# Patient Record
Sex: Female | Born: 1935 | Race: White | Hispanic: No | Smoking: Never smoker
Health system: Southern US, Community
[De-identification: ages and names within clinical notes are randomized; demographics above are authoritative.]

## PROBLEM LIST (undated history)

## (undated) DIAGNOSIS — W19XXXA Unspecified fall, initial encounter: Secondary | ICD-10-CM

## (undated) DIAGNOSIS — I10 Essential (primary) hypertension: Secondary | ICD-10-CM

## (undated) DIAGNOSIS — N189 Chronic kidney disease, unspecified: Secondary | ICD-10-CM

## (undated) DIAGNOSIS — E7800 Pure hypercholesterolemia, unspecified: Secondary | ICD-10-CM

## (undated) DIAGNOSIS — K219 Gastro-esophageal reflux disease without esophagitis: Secondary | ICD-10-CM

## (undated) DIAGNOSIS — C801 Malignant (primary) neoplasm, unspecified: Secondary | ICD-10-CM

## (undated) DIAGNOSIS — K253 Acute gastric ulcer without hemorrhage or perforation: Secondary | ICD-10-CM

## (undated) DIAGNOSIS — M1990 Unspecified osteoarthritis, unspecified site: Secondary | ICD-10-CM

## (undated) DIAGNOSIS — K56699 Other intestinal obstruction unspecified as to partial versus complete obstruction: Secondary | ICD-10-CM

## (undated) DIAGNOSIS — D500 Iron deficiency anemia secondary to blood loss (chronic): Secondary | ICD-10-CM

## (undated) DIAGNOSIS — I2510 Atherosclerotic heart disease of native coronary artery without angina pectoris: Secondary | ICD-10-CM

## (undated) DIAGNOSIS — D649 Anemia, unspecified: Secondary | ICD-10-CM

## (undated) DIAGNOSIS — G4700 Insomnia, unspecified: Secondary | ICD-10-CM

## (undated) DIAGNOSIS — I639 Cerebral infarction, unspecified: Secondary | ICD-10-CM

## (undated) DIAGNOSIS — I519 Heart disease, unspecified: Secondary | ICD-10-CM

## (undated) HISTORY — DX: Insomnia, unspecified: G47.00

## (undated) HISTORY — DX: Unspecified osteoarthritis, unspecified site: M19.90

## (undated) HISTORY — DX: Acute gastric ulcer without hemorrhage or perforation: K25.3

## (undated) HISTORY — DX: Essential (primary) hypertension: I10

## (undated) HISTORY — DX: Anemia, unspecified: D64.9

## (undated) HISTORY — DX: Iron deficiency anemia secondary to blood loss (chronic): D50.0

## (undated) HISTORY — DX: Other intestinal obstruction unspecified as to partial versus complete obstruction: K56.699

## (undated) HISTORY — DX: Unspecified fall, initial encounter: W19.XXXA

## (undated) HISTORY — DX: Heart disease, unspecified: I51.9

## (undated) HISTORY — DX: Malignant (primary) neoplasm, unspecified: C80.1

## (undated) HISTORY — DX: Pure hypercholesterolemia, unspecified: E78.00

## (undated) HISTORY — PX: OTHER SURGICAL HISTORY: SHX169

## (undated) HISTORY — DX: Gastro-esophageal reflux disease without esophagitis: K21.9

## (undated) HISTORY — PX: TONSILLECTOMY: SUR1361

---

## 2003-02-11 HISTORY — PX: CARDIAC SURGERY: SHX584

## 2003-02-11 HISTORY — PX: CORONARY ARTERY BYPASS GRAFT: SHX141

## 2003-05-01 ENCOUNTER — Other Ambulatory Visit: Payer: Self-pay

## 2004-06-10 ENCOUNTER — Ambulatory Visit: Payer: Self-pay | Admitting: Internal Medicine

## 2005-08-18 ENCOUNTER — Ambulatory Visit: Payer: Self-pay | Admitting: Internal Medicine

## 2006-03-12 ENCOUNTER — Ambulatory Visit: Payer: Self-pay | Admitting: Internal Medicine

## 2006-10-06 ENCOUNTER — Ambulatory Visit: Payer: Self-pay | Admitting: Internal Medicine

## 2007-10-25 ENCOUNTER — Ambulatory Visit: Payer: Self-pay | Admitting: Internal Medicine

## 2009-01-29 ENCOUNTER — Ambulatory Visit: Payer: Self-pay | Admitting: Internal Medicine

## 2010-04-03 ENCOUNTER — Ambulatory Visit: Payer: Self-pay | Admitting: Ophthalmology

## 2010-05-06 ENCOUNTER — Ambulatory Visit: Payer: Self-pay | Admitting: Ophthalmology

## 2010-06-10 ENCOUNTER — Ambulatory Visit: Payer: Self-pay | Admitting: Internal Medicine

## 2011-07-15 ENCOUNTER — Ambulatory Visit: Payer: Self-pay | Admitting: Internal Medicine

## 2015-01-02 ENCOUNTER — Inpatient Hospital Stay: Payer: PPO | Attending: Internal Medicine | Admitting: Internal Medicine

## 2015-01-02 ENCOUNTER — Inpatient Hospital Stay: Payer: PPO

## 2015-01-02 ENCOUNTER — Encounter: Payer: Self-pay | Admitting: Internal Medicine

## 2015-01-02 VITALS — BP 159/73 | HR 59 | Temp 96.7°F | Resp 18 | Wt 164.7 lb

## 2015-01-02 DIAGNOSIS — K219 Gastro-esophageal reflux disease without esophagitis: Secondary | ICD-10-CM | POA: Diagnosis not present

## 2015-01-02 DIAGNOSIS — D500 Iron deficiency anemia secondary to blood loss (chronic): Secondary | ICD-10-CM

## 2015-01-02 DIAGNOSIS — E7800 Pure hypercholesterolemia, unspecified: Secondary | ICD-10-CM | POA: Diagnosis not present

## 2015-01-02 DIAGNOSIS — I10 Essential (primary) hypertension: Secondary | ICD-10-CM | POA: Insufficient documentation

## 2015-01-02 DIAGNOSIS — Z7982 Long term (current) use of aspirin: Secondary | ICD-10-CM | POA: Diagnosis not present

## 2015-01-02 DIAGNOSIS — D509 Iron deficiency anemia, unspecified: Secondary | ICD-10-CM | POA: Diagnosis not present

## 2015-01-02 DIAGNOSIS — Z79899 Other long term (current) drug therapy: Secondary | ICD-10-CM | POA: Diagnosis not present

## 2015-01-02 LAB — COMPREHENSIVE METABOLIC PANEL
ALBUMIN: 3.7 g/dL (ref 3.5–5.0)
ALT: 12 U/L — ABNORMAL LOW (ref 14–54)
ANION GAP: 6 (ref 5–15)
AST: 17 U/L (ref 15–41)
Alkaline Phosphatase: 43 U/L (ref 38–126)
BUN: 27 mg/dL — ABNORMAL HIGH (ref 6–20)
CO2: 24 mmol/L (ref 22–32)
Calcium: 8.8 mg/dL — ABNORMAL LOW (ref 8.9–10.3)
Chloride: 106 mmol/L (ref 101–111)
Creatinine, Ser: 1.36 mg/dL — ABNORMAL HIGH (ref 0.44–1.00)
GFR calc Af Amer: 37 mL/min — ABNORMAL LOW (ref >60)
GFR calc non Af Amer: 32 mL/min — ABNORMAL LOW (ref >60)
GLUCOSE: 105 mg/dL — AB (ref 65–99)
POTASSIUM: 4.1 mmol/L (ref 3.5–5.1)
SODIUM: 136 mmol/L (ref 135–145)
Total Bilirubin: 0.3 mg/dL (ref 0.3–1.2)
Total Protein: 7.3 g/dL (ref 6.5–8.1)

## 2015-01-02 LAB — CBC WITH DIFFERENTIAL/PLATELET
BASOS ABS: 0 10*3/uL (ref 0–0.1)
Basophils Relative: 1 %
Eosinophils Absolute: 0.3 10*3/uL (ref 0–0.7)
Eosinophils Relative: 4 %
HEMATOCRIT: 24.9 % — AB (ref 35.0–47.0)
Hemoglobin: 7.7 g/dL — ABNORMAL LOW (ref 12.0–16.0)
LYMPHS PCT: 17 %
Lymphs Abs: 1.2 10*3/uL (ref 1.0–3.6)
MCH: 20.2 pg — ABNORMAL LOW (ref 26.0–34.0)
MCHC: 30.8 g/dL — ABNORMAL LOW (ref 32.0–36.0)
MCV: 65.6 fL — AB (ref 80.0–100.0)
MONO ABS: 0.8 10*3/uL (ref 0.2–0.9)
Monocytes Relative: 11 %
NEUTROS ABS: 5 10*3/uL (ref 1.4–6.5)
Neutrophils Relative %: 67 %
Platelets: 310 10*3/uL (ref 150–440)
RBC: 3.8 MIL/uL (ref 3.80–5.20)
RDW: 17.2 % — ABNORMAL HIGH (ref 11.5–14.5)
WBC: 7.3 10*3/uL (ref 3.6–11.0)

## 2015-01-02 LAB — FERRITIN: FERRITIN: 5 ng/mL — AB (ref 11–307)

## 2015-01-02 LAB — IRON AND TIBC
Iron: 10 ug/dL — ABNORMAL LOW (ref 28–170)
SATURATION RATIOS: 2 % — AB (ref 10.4–31.8)
TIBC: 464 ug/dL — ABNORMAL HIGH (ref 250–450)
UIBC: 454 ug/dL

## 2015-01-02 NOTE — Progress Notes (Signed)
[place] @ [facility] Telephone:(336) [phone]  Fax:(336) [place]: [birth year]  MR#: [id number]  [id number]  Patient Care Team: Lynnell Jude, MD as PCP - General (Family Medicine)  CHIEF COMPLAINT: No chief complaint on file.  iron deficiency anemia  HISTORY OF PRESENT ILLNESS:   Patient is referred for evaluation of persistent microcytic anemia. Reportedly, the patient had iron deficiency anemia since 2014, with hemoglobin ranging between 6.5 and 8.2 g/dL. Review of previous medical records indicate that as far back as 2003 hemoglobin already was low at 10 g/dL Patient does not complain of any obvious bleeding from any source, she also denies change in the color of stool. She is able to perform all activities of daily living with minimal difficulties, mostly secondary to left knee pain. She claims that she has lost approximately 10 pounds of weight over the past 6 months, but her appetite is reasonable, and her diet is regular. She claims that occasionally she has constipation, but it does not last for more than a couple of days. She has been taking iron supplementation for the past 7 months. She claims that she has been taking iron twice a day rather than 3 times a day, but she has done this consistently without breaks.  REVIEW OF SYSTEMS:   Review of Systems  All other systems reviewed and are negative.    PAST MEDICAL HISTORY: Past Medical History  Diagnosis Date  . GERD (gastroesophageal reflux disease)   . Hypertension   . Hypercholesteremia   . Insomnia   . Arthritis   . Anemia   . Heart disease     PAST SURGICAL HISTORY: Past Surgical History  Procedure Laterality Date  . Cardiac surgery  2005  . Tonsillectomy    . Toe removal      FAMILY HISTORY No family history on file.  ADVANCED DIRECTIVES:  No flowsheet data found.  HEALTH MAINTENANCE: Social History  Substance Use Topics  . Smoking status: Not on file  . Smokeless tobacco:  Not on file  . Alcohol Use: Not on file     Allergies  Allergen Reactions  . Amoxicillin Rash    Current Outpatient Prescriptions  Medication Sig Dispense Refill  . aspirin 81 MG tablet Take 81 mg by mouth daily.    . hydrochlorothiazide (HYDRODIURIL) 25 MG tablet Take 25 mg by mouth daily.    Marland Kitchen lovastatin (MEVACOR) 40 MG tablet Take 40 mg by mouth at bedtime.    . metoprolol tartrate (LOPRESSOR) 25 MG tablet Take 25 mg by mouth 2 (two) times daily.    . naproxen sodium (ANAPROX) 220 MG tablet Take 220 mg by mouth daily.    Marland Kitchen omeprazole (PRILOSEC) 20 MG capsule Take 20 mg by mouth daily.     No current facility-administered medications for this visit.    OBJECTIVE:  Filed Vitals:   01/02/15 1401  BP: 159/73  Pulse: 59  Temp: 96.7 F (35.9 C)  Resp: 18     There is no height on file to calculate BMI.    ECOG FS:1 - Symptomatic but completely ambulatory  Physical Exam  Constitutional: She is oriented to person, place, and time and well-developed, well-nourished, and in no distress. No distress.  HENT:  Head: Normocephalic and atraumatic.  Right Ear: External ear normal.  Left Ear: External ear normal.  Mouth/Throat: Oropharynx is clear and moist.  Eyes: Conjunctivae are normal. Pupils are equal, round, and reactive to light. Right eye  exhibits no discharge. Left eye exhibits no discharge. No scleral icterus.  Neck: Normal range of motion. Neck supple. No JVD present. No tracheal deviation present. No thyromegaly present.  Cardiovascular: Normal rate, regular rhythm, normal heart sounds and intact distal pulses.  Exam reveals no gallop and no friction rub.   No murmur heard. Pulmonary/Chest: Effort normal and breath sounds normal. No stridor. No respiratory distress. She has no wheezes. She has no rales. She exhibits no tenderness.  Abdominal: Soft. Bowel sounds are normal. She exhibits no distension and no mass. There is no tenderness. There is no rebound and no guarding.    Genitourinary:  Postponed  Musculoskeletal: Normal range of motion. She exhibits no edema or tenderness.  Lymphadenopathy:    She has no cervical adenopathy.  Neurological: She is alert and oriented to person, place, and time. She has normal reflexes. No cranial nerve deficit. She exhibits normal muscle tone. Gait normal. Coordination normal. GCS score is 15.  Skin: Skin is warm. No rash noted. She is not diaphoretic. No erythema. No pallor.  Multiple skin tags There is a small skin break on the left shin with serous discharge  Psychiatric: Mood, memory, affect and judgment normal.     LAB RESULTS: Recent Results (from the past 2160 hour(s))  CBC with Differential/Platelet     Status: Abnormal   Collection Time: 01/02/15  3:05 PM  Result Value Ref Range   WBC 7.3 3.6 - 11.0 K/uL   RBC 3.80 3.80 - 5.20 MIL/uL   Hemoglobin 7.7 (L) 12.0 - 16.0 g/dL   HCT 24.9 (L) 35.0 - 47.0 %   MCV 65.6 (L) 80.0 - 100.0 fL   MCH 20.2 (L) 26.0 - 34.0 pg   MCHC 30.8 (L) 32.0 - 36.0 g/dL   RDW 17.2 (H) 11.5 - 14.5 %   Platelets 310 150 - 440 K/uL   Neutrophils Relative % 67 %   Neutro Abs 5.0 1.4 - 6.5 K/uL   Lymphocytes Relative 17 %   Lymphs Abs 1.2 1.0 - 3.6 K/uL   Monocytes Relative 11 %   Monocytes Absolute 0.8 0.2 - 0.9 K/uL   Eosinophils Relative 4 %   Eosinophils Absolute 0.3 0 - 0.7 K/uL   Basophils Relative 1 %   Basophils Absolute 0.0 0 - 0.1 K/uL     ASSESSMENT: Microcytic anemia- most likely iron deficiency anemia, since has history of iron deficiency anemia with low MCV and low ferritin level. Reportedly the patient has been on iron supplementation for at least 6 months without any improvement in hemoglobin concentration. We will take into account the fact that Ms. Cabazos is 81 years old, and would like to avoid invasive procedures, such as upper and lower endoscopies, so we will request her to perform FOBT 3 and send cartridges to our clinic. In the meantime, we will plan on  administering Feraheme 510 mg 1 during next week in our [place] clinic. Surprisingly Ms. Monsivais has no significant complaints, which would indicate that her energy level and ability to perform activities of daily living are severely compromised because of anemia, as such we will not request red blood cell transfusion. Once Shirlean Kelly is administered, patient should be seen in our clinic either in [place] or [place] in 1 month.   Patient expressed understanding and was in agreement with this plan. She also understands that She can call clinic at any time with any questions, concerns, or complaints.    No matching staging information was found for  the patient.  Roxana Hires, MD   01/02/2015 1:33 PM

## 2015-01-02 NOTE — Progress Notes (Signed)
Patient's hemoglobin was low with PCP labs and they have been following since 05/2014 with no improvement with the addition of iron tablets.  She denies any bleeding.

## 2015-01-08 ENCOUNTER — Inpatient Hospital Stay: Payer: PPO

## 2015-01-09 ENCOUNTER — Ambulatory Visit: Payer: PPO | Admitting: Internal Medicine

## 2015-01-10 ENCOUNTER — Telehealth: Payer: Self-pay | Admitting: *Deleted

## 2015-01-10 ENCOUNTER — Other Ambulatory Visit: Payer: Self-pay | Admitting: Internal Medicine

## 2015-01-10 DIAGNOSIS — D509 Iron deficiency anemia, unspecified: Secondary | ICD-10-CM | POA: Insufficient documentation

## 2015-01-10 NOTE — Telephone Encounter (Signed)
Pt had appt for Monday or tues in [place] to get iron and pt states she forgot, she had the appt for 12/22 but she did not see the other appt on card.  She would like to come on Thursday.  I told her I would check with [place] office and call her back.  I did check with [place] and pt can come over to [facility] Thursday 12/1 9:45.  Pt agreeable to the plan.

## 2015-01-11 ENCOUNTER — Inpatient Hospital Stay: Payer: PPO | Attending: Internal Medicine

## 2015-01-11 VITALS — BP 133/60 | HR 57 | Temp 96.9°F | Resp 18

## 2015-01-11 DIAGNOSIS — Z79899 Other long term (current) drug therapy: Secondary | ICD-10-CM | POA: Diagnosis not present

## 2015-01-11 DIAGNOSIS — I10 Essential (primary) hypertension: Secondary | ICD-10-CM | POA: Diagnosis not present

## 2015-01-11 DIAGNOSIS — E7800 Pure hypercholesterolemia, unspecified: Secondary | ICD-10-CM | POA: Insufficient documentation

## 2015-01-11 DIAGNOSIS — Z7982 Long term (current) use of aspirin: Secondary | ICD-10-CM | POA: Diagnosis not present

## 2015-01-11 DIAGNOSIS — D509 Iron deficiency anemia, unspecified: Secondary | ICD-10-CM | POA: Insufficient documentation

## 2015-01-11 DIAGNOSIS — D500 Iron deficiency anemia secondary to blood loss (chronic): Secondary | ICD-10-CM

## 2015-01-11 DIAGNOSIS — K219 Gastro-esophageal reflux disease without esophagitis: Secondary | ICD-10-CM | POA: Diagnosis not present

## 2015-01-11 LAB — OCCULT BLOOD X 1 CARD TO LAB, STOOL
Fecal Occult Bld: NEGATIVE
Fecal Occult Bld: NEGATIVE

## 2015-01-11 MED ORDER — SODIUM CHLORIDE 0.9 % IV SOLN
510.0000 mg | Freq: Once | INTRAVENOUS | Status: AC
  Administered 2015-01-11: 510 mg via INTRAVENOUS
  Filled 2015-01-11: qty 17

## 2015-01-11 MED ORDER — SODIUM CHLORIDE 0.9 % IV SOLN
Freq: Once | INTRAVENOUS | Status: AC
  Administered 2015-01-11: 10:00:00 via INTRAVENOUS
  Filled 2015-01-11: qty 1000

## 2015-01-11 NOTE — Progress Notes (Signed)
Pt received just about all of her feraheme infusion when she mentioned she was itchy~ 5 ml left in bag. Stopped infusion. Started NS back up. Will observe pt for 30 minutes. Pt is not scratching, does not feel warm or short of breath. Pt had no itching or any side effects at time of discharge. Pt was observed for 1 hour d/t her age (81 yo) and she drove herself to clinic today.

## 2015-01-11 NOTE — Patient Instructions (Signed)

## 2015-02-01 ENCOUNTER — Inpatient Hospital Stay (HOSPITAL_BASED_OUTPATIENT_CLINIC_OR_DEPARTMENT_OTHER): Payer: PPO | Admitting: Internal Medicine

## 2015-02-01 ENCOUNTER — Inpatient Hospital Stay: Payer: PPO

## 2015-02-01 VITALS — BP 149/67 | HR 53 | Temp 96.2°F | Resp 18

## 2015-02-01 VITALS — BP 149/64 | HR 54 | Temp 97.6°F | Ht 64.0 in | Wt 164.1 lb

## 2015-02-01 DIAGNOSIS — D500 Iron deficiency anemia secondary to blood loss (chronic): Secondary | ICD-10-CM

## 2015-02-01 DIAGNOSIS — Z79899 Other long term (current) drug therapy: Secondary | ICD-10-CM | POA: Diagnosis not present

## 2015-02-01 DIAGNOSIS — D509 Iron deficiency anemia, unspecified: Secondary | ICD-10-CM

## 2015-02-01 LAB — CBC WITH DIFFERENTIAL/PLATELET
BASOS ABS: 0 10*3/uL (ref 0–0.1)
Basophils Relative: 1 %
EOS ABS: 0.3 10*3/uL (ref 0–0.7)
EOS PCT: 5 %
HCT: 28.1 % — ABNORMAL LOW (ref 35.0–47.0)
Hemoglobin: 8.7 g/dL — ABNORMAL LOW (ref 12.0–16.0)
LYMPHS PCT: 16 %
Lymphs Abs: 0.9 10*3/uL — ABNORMAL LOW (ref 1.0–3.6)
MCH: 22.3 pg — ABNORMAL LOW (ref 26.0–34.0)
MCHC: 31 g/dL — ABNORMAL LOW (ref 32.0–36.0)
MCV: 72 fL — AB (ref 80.0–100.0)
Monocytes Absolute: 0.4 10*3/uL (ref 0.2–0.9)
Monocytes Relative: 8 %
Neutro Abs: 3.9 10*3/uL (ref 1.4–6.5)
Neutrophils Relative %: 70 %
PLATELETS: 291 10*3/uL (ref 150–440)
RBC: 3.91 MIL/uL (ref 3.80–5.20)
RDW: 26.2 % — ABNORMAL HIGH (ref 11.5–14.5)
WBC: 5.5 10*3/uL (ref 3.6–11.0)

## 2015-02-01 LAB — FERRITIN: FERRITIN: 36 ng/mL (ref 11–307)

## 2015-02-01 MED ORDER — SODIUM CHLORIDE 0.9 % IV SOLN
510.0000 mg | Freq: Once | INTRAVENOUS | Status: AC
  Administered 2015-02-01: 510 mg via INTRAVENOUS
  Filled 2015-02-01: qty 17

## 2015-02-01 NOTE — Progress Notes (Signed)
Patient here reports occasional orange colored spotting.

## 2015-02-01 NOTE — Progress Notes (Signed)
[place] @ [facility] Telephone:(336) [phone]  Fax:(336) [place]: 12-28-1917  MR#: [id number]  [id number]#:[id number]  Patient Care Team: Lynnell Jude, MD as PCP - General (Family Medicine)  CHIEF COMPLAINT: No chief complaint on file.  iron deficiency anemia  HISTORY OF PRESENT ILLNESS:   Patient is referred for evaluation of persistent microcytic anemia. Reportedly, the patient had iron deficiency anemia since 2014, with hemoglobin ranging between 6.5 and 8.2 g/dL. Review of previous medical records indicate that as far back as 2003 hemoglobin already was low at 10 g/dL. Our workup was consistent with severe iron deficiency anemia. Taking into account patient's very respectable age, we decided not to pursue any invasive diagnostic procedures, but we performed fecal occult blood tests which were negative.she received 1 dose of Feraheme, which was able to tolerate well.  Current status: The patient returns to our clinic for a follow-up visit. She has been feeling reasonably well. Patient does not complain of any obvious bleeding from any source, she also denies change in the color of stool. She is able to perform all activities of daily living with minimal difficulties, mostly secondary to left knee pain. She claims that she has lost approximately 10 pounds of weight over the past 6 months, but her appetite is reasonable, and her diet is regular. She claims that occasionally she has constipation, but it does not last for more than a couple of days. She has been taking iron supplementation for the past 7 months. She claims that she has been taking iron twice a day rather than 3 times a day, but she has done this consistently without breaks. Today she told us that for the past 38month she has had orange scant vaginal discharge, which has resolved by now.She denies any dysuria, burning with urination. She denies any bleeding.  REVIEW OF SYSTEMS:   Review of Systems  All other  systems reviewed and are negative.    PAST MEDICAL HISTORY: Past Medical History  Diagnosis Date  . GERD (gastroesophageal reflux disease)   . Hypertension   . Hypercholesteremia   . Insomnia   . Arthritis   . Anemia   . Heart disease     PAST SURGICAL HISTORY: Past Surgical History  Procedure Laterality Date  . Cardiac surgery  2005  . Tonsillectomy    . Toe removal      FAMILY HISTORY No family history on file.  ADVANCED DIRECTIVES:  No flowsheet data found.  HEALTH MAINTENANCE: Social History  Substance Use Topics  . Smoking status: Never Smoker   . Smokeless tobacco: Never Used  . Alcohol Use: No     Allergies  Allergen Reactions  . Amoxicillin Rash    Current Outpatient Prescriptions  Medication Sig Dispense Refill  . aspirin 81 MG tablet Take 81 mg by mouth daily.    . hydrochlorothiazide (HYDRODIURIL) 25 MG tablet Take 25 mg by mouth daily.    .Marland Kitchenlovastatin (MEVACOR) 40 MG tablet Take 40 mg by mouth at bedtime.    . metoprolol tartrate (LOPRESSOR) 25 MG tablet Take 25 mg by mouth 2 (two) times daily.    . naproxen sodium (ANAPROX) 220 MG tablet Take 220 mg by mouth daily.    .Marland Kitchenomeprazole (PRILOSEC) 20 MG capsule Take 20 mg by mouth daily.     No current facility-administered medications for this visit.    OBJECTIVE:  There were no vitals filed for this visit.   There is no height  or weight on file to calculate BMI.    ECOG FS:1 - Symptomatic but completely ambulatory  Physical Exam  Constitutional: She is oriented to person, place, and time and well-developed, well-nourished, and in no distress. No distress.  HENT:  Head: Normocephalic and atraumatic.  Right Ear: External ear normal.  Left Ear: External ear normal.  Mouth/Throat: Oropharynx is clear and moist.  Eyes: Conjunctivae are normal. Pupils are equal, round, and reactive to light. Right eye exhibits no discharge. Left eye exhibits no discharge. No scleral icterus.  Neck: Normal range of  motion. Neck supple. No JVD present. No tracheal deviation present. No thyromegaly present.  Cardiovascular: Normal rate, regular rhythm, normal heart sounds and intact distal pulses.  Exam reveals no gallop and no friction rub.   No murmur heard. Pulmonary/Chest: Effort normal and breath sounds normal. No stridor. No respiratory distress. She has no wheezes. She has no rales. She exhibits no tenderness.  Abdominal: Soft. Bowel sounds are normal. She exhibits no distension and no mass. There is no tenderness. There is no rebound and no guarding.  Genitourinary:  Postponed  Musculoskeletal: Normal range of motion. She exhibits no edema or tenderness.  Lymphadenopathy:    She has no cervical adenopathy.  Neurological: She is alert and oriented to person, place, and time. She has normal reflexes. No cranial nerve deficit. She exhibits normal muscle tone. Gait normal. Coordination normal. GCS score is 15.  Skin: Skin is warm. No rash noted. She is not diaphoretic. No erythema. No pallor.  Multiple skin tags There is a small skin break on the left shin with serous discharge  Psychiatric: Mood, memory, affect and judgment normal.     LAB RESULTS: Recent Results (from the past 2160 hour(s))  CBC with Differential/Platelet     Status: Abnormal   Collection Time: 01/02/15  3:05 PM  Result Value Ref Range   WBC 7.3 3.6 - 11.0 K/uL   RBC 3.80 3.80 - 5.20 MIL/uL   Hemoglobin 7.7 (L) 12.0 - 16.0 g/dL   HCT 24.9 (L) 35.0 - 47.0 %   MCV 65.6 (L) 80.0 - 100.0 fL   MCH 20.2 (L) 26.0 - 34.0 pg   MCHC 30.8 (L) 32.0 - 36.0 g/dL   RDW 17.2 (H) 11.5 - 14.5 %   Platelets 310 150 - 440 K/uL   Neutrophils Relative % 67 %   Neutro Abs 5.0 1.4 - 6.5 K/uL   Lymphocytes Relative 17 %   Lymphs Abs 1.2 1.0 - 3.6 K/uL   Monocytes Relative 11 %   Monocytes Absolute 0.8 0.2 - 0.9 K/uL   Eosinophils Relative 4 %   Eosinophils Absolute 0.3 0 - 0.7 K/uL   Basophils Relative 1 %   Basophils Absolute 0.0 0 - 0.1  K/uL  Iron and TIBC     Status: Abnormal   Collection Time: 01/02/15  3:05 PM  Result Value Ref Range   Iron 10 (L) 28 - 170 ug/dL   TIBC 464 (H) 250 - 450 ug/dL   Saturation Ratios 2 (L) 10.4 - 31.8 %   UIBC 454 ug/dL  Ferritin     Status: Abnormal   Collection Time: 01/02/15  3:05 PM  Result Value Ref Range   Ferritin 5 (L) 11 - 307 ng/mL  Comprehensive metabolic panel     Status: Abnormal   Collection Time: 01/02/15  3:05 PM  Result Value Ref Range   Sodium 136 135 - 145 mmol/L   Potassium 4.1 3.5 -  5.1 mmol/L   Chloride 106 101 - 111 mmol/L   CO2 24 22 - 32 mmol/L   Glucose, Bld 105 (H) 65 - 99 mg/dL   BUN 27 (H) 6 - 20 mg/dL   Creatinine, Ser 1.36 (H) 0.44 - 1.00 mg/dL   Calcium 8.8 (L) 8.9 - 10.3 mg/dL   Total Protein 7.3 6.5 - 8.1 g/dL   Albumin 3.7 3.5 - 5.0 g/dL   AST 17 15 - 41 U/L   ALT 12 (L) 14 - 54 U/L   Alkaline Phosphatase 43 38 - 126 U/L   Total Bilirubin 0.3 0.3 - 1.2 mg/dL   GFR calc non Af Amer 32 (L) >60 mL/min   GFR calc Af Amer 37 (L) >60 mL/min    Comment: (NOTE) The eGFR has been calculated using the CKD EPI equation. This calculation has not been validated in all clinical situations. eGFR's persistently <60 mL/min signify possible Chronic Kidney Disease.    Anion gap 6 5 - 15  Occult blood card to lab, stool     Status: None   Collection Time: 01/10/15  8:00 AM  Result Value Ref Range   Fecal Occult Bld NEGATIVE NEGATIVE  Occult blood card to lab, stool     Status: None   Collection Time: 01/11/15  9:00 AM  Result Value Ref Range   Fecal Occult Bld NEGATIVE NEGATIVE     ASSESSMENT: Iron deficiency anemia-most likely secondary to poor absorption, since fecal occult blood test was negative x2. There was good response to Feraheme with hemoglobin improved from 7.7-8.7 g/dL. We  We will give Ms. Dowdell another dose of Feraheme today and will have her return to our clinic to check hemoglobin and iron profile.   Patient expressed understanding  and was in agreement with this plan. She also understands that She can call clinic at any time with any questions, concerns, or complaints.    No matching staging information was found for the patient.  Roxana Hires, MD   02/01/2015 10:17 AM

## 2015-03-08 ENCOUNTER — Inpatient Hospital Stay: Payer: PPO | Attending: Internal Medicine

## 2015-03-08 ENCOUNTER — Encounter: Payer: Self-pay | Admitting: Internal Medicine

## 2015-03-08 ENCOUNTER — Inpatient Hospital Stay (HOSPITAL_BASED_OUTPATIENT_CLINIC_OR_DEPARTMENT_OTHER): Payer: PPO | Admitting: Internal Medicine

## 2015-03-08 VITALS — BP 157/62 | HR 54 | Temp 97.7°F | Resp 20 | Ht 64.0 in | Wt 164.5 lb

## 2015-03-08 DIAGNOSIS — Z79899 Other long term (current) drug therapy: Secondary | ICD-10-CM

## 2015-03-08 DIAGNOSIS — I10 Essential (primary) hypertension: Secondary | ICD-10-CM | POA: Insufficient documentation

## 2015-03-08 DIAGNOSIS — I519 Heart disease, unspecified: Secondary | ICD-10-CM | POA: Insufficient documentation

## 2015-03-08 DIAGNOSIS — E7800 Pure hypercholesterolemia, unspecified: Secondary | ICD-10-CM | POA: Insufficient documentation

## 2015-03-08 DIAGNOSIS — K219 Gastro-esophageal reflux disease without esophagitis: Secondary | ICD-10-CM | POA: Insufficient documentation

## 2015-03-08 DIAGNOSIS — Z7982 Long term (current) use of aspirin: Secondary | ICD-10-CM | POA: Diagnosis not present

## 2015-03-08 DIAGNOSIS — D509 Iron deficiency anemia, unspecified: Secondary | ICD-10-CM | POA: Insufficient documentation

## 2015-03-08 DIAGNOSIS — M1990 Unspecified osteoarthritis, unspecified site: Secondary | ICD-10-CM | POA: Insufficient documentation

## 2015-03-08 LAB — CBC WITH DIFFERENTIAL/PLATELET
Basophils Absolute: 0 10*3/uL (ref 0–0.1)
Basophils Relative: 1 %
EOS ABS: 0.4 10*3/uL (ref 0–0.7)
EOS PCT: 5 %
HCT: 30.5 % — ABNORMAL LOW (ref 35.0–47.0)
Hemoglobin: 9.9 g/dL — ABNORMAL LOW (ref 12.0–16.0)
LYMPHS ABS: 1.4 10*3/uL (ref 1.0–3.6)
Lymphocytes Relative: 20 %
MCH: 25.6 pg — AB (ref 26.0–34.0)
MCHC: 32.5 g/dL (ref 32.0–36.0)
MCV: 78.5 fL — ABNORMAL LOW (ref 80.0–100.0)
MONOS PCT: 12 %
Monocytes Absolute: 0.8 10*3/uL (ref 0.2–0.9)
Neutro Abs: 4.4 10*3/uL (ref 1.4–6.5)
Neutrophils Relative %: 62 %
PLATELETS: 248 10*3/uL (ref 150–440)
RBC: 3.89 MIL/uL (ref 3.80–5.20)
RDW: 27 % — AB (ref 11.5–14.5)
WBC: 7 10*3/uL (ref 3.6–11.0)

## 2015-03-08 LAB — IRON AND TIBC
IRON: 25 ug/dL — AB (ref 28–170)
Saturation Ratios: 7 % — ABNORMAL LOW (ref 10.4–31.8)
TIBC: 370 ug/dL (ref 250–450)
UIBC: 345 ug/dL

## 2015-03-08 LAB — FERRITIN: Ferritin: 26 ng/mL (ref 11–307)

## 2015-03-08 NOTE — Progress Notes (Signed)
Feels better than when she first came but still tired.  She works 2 days a week.

## 2015-03-08 NOTE — Progress Notes (Signed)
[place] @ [facility] [phone]  Fax:(336) [place]: 1917-03-15  MR#: [id number]  GHW#:[id number]  Patient Care Team: Lynnell Jude, MD as PCP - General (Family Medicine)  CHIEF COMPLAINT: No chief complaint on file.  iron deficiency anemia  HISTORY OF PRESENT ILLNESS:   Patient is referred for evaluation of persistent microcytic anemia. Reportedly, the patient had iron deficiency anemia since 2014, with hemoglobin ranging between 6.5 and 8.2 g/dL. Review of previous medical records indicate that as far back as 2003 hemoglobin already was low at 10 g/dL. Our workup was consistent with severe iron deficiency anemia. Taking into account patient's very respectable age, we decided not to pursue any invasive diagnostic procedures, but we performed fecal occult blood tests which were negative.she received 1 dose of Feraheme, which was able to tolerate well.  Current status: The patient returns to our clinic for a follow-up visit. She has been feeling reasonably well. She does not have any new complaints. She claims that she is able to sleep better. She denies chest pain, shortness of breath, nausea, vomiting, diarrhea, constipation.  REVIEW OF SYSTEMS:   Review of Systems  All other systems reviewed and are negative.    PAST MEDICAL HISTORY: Past Medical History  Diagnosis Date  . GERD (gastroesophageal reflux disease)   . Hypertension   . Hypercholesteremia   . Insomnia   . Arthritis   . Anemia   . Heart disease     PAST SURGICAL HISTORY: Past Surgical History  Procedure Laterality Date  . Cardiac surgery  2005  . Tonsillectomy    . Toe removal      FAMILY HISTORY No family history on file.  ADVANCED DIRECTIVES:  No flowsheet data found.  HEALTH MAINTENANCE: Social History  Substance Use Topics  . Smoking status: Never Smoker   . Smokeless tobacco: Never Used  . Alcohol Use: No     Allergies  Allergen Reactions  .  Amoxicillin Rash    Current Outpatient Prescriptions  Medication Sig Dispense Refill  . aspirin 81 MG tablet Take 81 mg by mouth daily.    . hydrochlorothiazide (HYDRODIURIL) 25 MG tablet Take 25 mg by mouth daily.    Marland Kitchen lovastatin (MEVACOR) 40 MG tablet Take 40 mg by mouth at bedtime.    . metoprolol tartrate (LOPRESSOR) 25 MG tablet Take 25 mg by mouth 2 (two) times daily.    . naproxen sodium (ANAPROX) 220 MG tablet Take 220 mg by mouth daily.    Marland Kitchen omeprazole (PRILOSEC) 20 MG capsule Take 20 mg by mouth daily.     No current facility-administered medications for this visit.    OBJECTIVE:  There were no vitals filed for this visit.   There is no weight on file to calculate BMI.    ECOG FS:1 - Symptomatic but completely ambulatory  Physical Exam  Constitutional: She is oriented to person, place, and time and well-developed, well-nourished, and in no distress. No distress.  HENT:  Head: Normocephalic and atraumatic.  Right Ear: External ear normal.  Left Ear: External ear normal.  Mouth/Throat: Oropharynx is clear and moist.  Eyes: Conjunctivae are normal. Pupils are equal, round, and reactive to light. Right eye exhibits no discharge. Left eye exhibits no discharge. No scleral icterus.  Neck: Normal range of motion. Neck supple. No JVD present. No tracheal deviation present. No thyromegaly present.  Cardiovascular: Normal rate, regular rhythm, normal heart sounds and intact distal pulses.  Exam reveals no  gallop and no friction rub.   No murmur heard. Pulmonary/Chest: Effort normal and breath sounds normal. No stridor. No respiratory distress. She has no wheezes. She has no rales. She exhibits no tenderness.  Abdominal: Soft. Bowel sounds are normal. She exhibits no distension and no mass. There is no tenderness. There is no rebound and no guarding.  Genitourinary:  Postponed  Musculoskeletal: Normal range of motion. She exhibits no edema or tenderness.  Lymphadenopathy:    She  has no cervical adenopathy.  Neurological: She is alert and oriented to person, place, and time. She has normal reflexes. No cranial nerve deficit. She exhibits normal muscle tone. Gait normal. Coordination normal. GCS score is 15.  Skin: Skin is warm. No rash noted. She is not diaphoretic. No erythema. No pallor.  Multiple skin tags There is a small skin break on the left shin with serous discharge  Psychiatric: Mood, memory, affect and judgment normal.     LAB RESULTS: Recent Results (from the past 2160 hour(s))  CBC with Differential/Platelet     Status: Abnormal   Collection Time: 01/02/15  3:05 PM  Result Value Ref Range   WBC 7.3 3.6 - 11.0 K/uL   RBC 3.80 3.80 - 5.20 MIL/uL   Hemoglobin 7.7 (L) 12.0 - 16.0 g/dL   HCT 24.9 (L) 35.0 - 47.0 %   MCV 65.6 (L) 80.0 - 100.0 fL   MCH 20.2 (L) 26.0 - 34.0 pg   MCHC 30.8 (L) 32.0 - 36.0 g/dL   RDW 17.2 (H) 11.5 - 14.5 %   Platelets 310 150 - 440 K/uL   Neutrophils Relative % 67 %   Neutro Abs 5.0 1.4 - 6.5 K/uL   Lymphocytes Relative 17 %   Lymphs Abs 1.2 1.0 - 3.6 K/uL   Monocytes Relative 11 %   Monocytes Absolute 0.8 0.2 - 0.9 K/uL   Eosinophils Relative 4 %   Eosinophils Absolute 0.3 0 - 0.7 K/uL   Basophils Relative 1 %   Basophils Absolute 0.0 0 - 0.1 K/uL  Iron and TIBC     Status: Abnormal   Collection Time: 01/02/15  3:05 PM  Result Value Ref Range   Iron 10 (L) 28 - 170 ug/dL   TIBC 464 (H) 250 - 450 ug/dL   Saturation Ratios 2 (L) 10.4 - 31.8 %   UIBC 454 ug/dL  Ferritin     Status: Abnormal   Collection Time: 01/02/15  3:05 PM  Result Value Ref Range   Ferritin 5 (L) 11 - 307 ng/mL  Comprehensive metabolic panel     Status: Abnormal   Collection Time: 01/02/15  3:05 PM  Result Value Ref Range   Sodium 136 135 - 145 mmol/L   Potassium 4.1 3.5 - 5.1 mmol/L   Chloride 106 101 - 111 mmol/L   CO2 24 22 - 32 mmol/L   Glucose, Bld 105 (H) 65 - 99 mg/dL   BUN 27 (H) 6 - 20 mg/dL   Creatinine, Ser 1.36 (H) 0.44 -  1.00 mg/dL   Calcium 8.8 (L) 8.9 - 10.3 mg/dL   Total Protein 7.3 6.5 - 8.1 g/dL   Albumin 3.7 3.5 - 5.0 g/dL   AST 17 15 - 41 U/L   ALT 12 (L) 14 - 54 U/L   Alkaline Phosphatase 43 38 - 126 U/L   Total Bilirubin 0.3 0.3 - 1.2 mg/dL   GFR calc non Af Amer 32 (L) >60 mL/min   GFR calc Af Amer 37 (  L) >60 mL/min    Comment: (NOTE) The eGFR has been calculated using the CKD EPI equation. This calculation has not been validated in all clinical situations. eGFR's persistently <60 mL/min signify possible Chronic Kidney Disease.    Anion gap 6 5 - 15  Occult blood card to lab, stool     Status: None   Collection Time: 01/10/15  8:00 AM  Result Value Ref Range   Fecal Occult Bld NEGATIVE NEGATIVE  Occult blood card to lab, stool     Status: None   Collection Time: 01/11/15  9:00 AM  Result Value Ref Range   Fecal Occult Bld NEGATIVE NEGATIVE  CBC with Differential/Platelet     Status: Abnormal   Collection Time: 02/01/15 10:10 AM  Result Value Ref Range   WBC 5.5 3.6 - 11.0 K/uL   RBC 3.91 3.80 - 5.20 MIL/uL   Hemoglobin 8.7 (L) 12.0 - 16.0 g/dL   HCT 28.1 (L) 35.0 - 47.0 %   MCV 72.0 (L) 80.0 - 100.0 fL   MCH 22.3 (L) 26.0 - 34.0 pg   MCHC 31.0 (L) 32.0 - 36.0 g/dL   RDW 26.2 (H) 11.5 - 14.5 %   Platelets 291 150 - 440 K/uL   Neutrophils Relative % 70 %   Neutro Abs 3.9 1.4 - 6.5 K/uL   Lymphocytes Relative 16 %   Lymphs Abs 0.9 (L) 1.0 - 3.6 K/uL   Monocytes Relative 8 %   Monocytes Absolute 0.4 0.2 - 0.9 K/uL   Eosinophils Relative 5 %   Eosinophils Absolute 0.3 0 - 0.7 K/uL   Basophils Relative 1 %   Basophils Absolute 0.0 0 - 0.1 K/uL  Ferritin     Status: None   Collection Time: 02/01/15 10:10 AM  Result Value Ref Range   Ferritin 36 11 - 307 ng/mL  CBC with Differential/Platelet     Status: Abnormal   Collection Time: 03/08/15 11:15 AM  Result Value Ref Range   WBC 7.0 3.6 - 11.0 K/uL   RBC 3.89 3.80 - 5.20 MIL/uL   Hemoglobin 9.9 (L) 12.0 - 16.0 g/dL   HCT 30.5  (L) 35.0 - 47.0 %   MCV 78.5 (L) 80.0 - 100.0 fL   MCH 25.6 (L) 26.0 - 34.0 pg   MCHC 32.5 32.0 - 36.0 g/dL   RDW 27.0 (H) 11.5 - 14.5 %   Platelets 248 150 - 440 K/uL   Neutrophils Relative % 62 %   Neutro Abs 4.4 1.4 - 6.5 K/uL   Lymphocytes Relative 20 %   Lymphs Abs 1.4 1.0 - 3.6 K/uL   Monocytes Relative 12 %   Monocytes Absolute 0.8 0.2 - 0.9 K/uL   Eosinophils Relative 5 %   Eosinophils Absolute 0.4 0 - 0.7 K/uL   Basophils Relative 1 %   Basophils Absolute 0.0 0 - 0.1 K/uL     ASSESSMENT: Iron deficiency anemia-most likely secondary to poor absorption, since fecal occult blood test was negative x2. There was good response to 3 infusions of Feraheme with hemoglobin improved from 7.7- 9.9 g/dL. We will check ferritin today, but likely will not need to give another dose of IV Feraheme. She will return to our clinic in 1 month with CBC and ferritin, and at that point we will decide on the frequency of follow-up. Patient expressed understanding and was in agreement with this plan. She also understands that She can call clinic at any time with any questions, concerns, or complaints.  No matching staging information was found for the patient.  Roxana Hires, MD   03/08/2015 9:18 AM

## 2015-03-15 ENCOUNTER — Other Ambulatory Visit: Payer: Self-pay | Admitting: Internal Medicine

## 2015-03-15 ENCOUNTER — Inpatient Hospital Stay: Payer: PPO | Attending: Internal Medicine

## 2015-03-15 VITALS — BP 130/69 | HR 69 | Temp 97.6°F | Resp 20

## 2015-03-15 DIAGNOSIS — D509 Iron deficiency anemia, unspecified: Secondary | ICD-10-CM | POA: Insufficient documentation

## 2015-03-15 DIAGNOSIS — Z79899 Other long term (current) drug therapy: Secondary | ICD-10-CM | POA: Diagnosis not present

## 2015-03-15 MED ORDER — SODIUM CHLORIDE 0.9 % IV SOLN
Freq: Once | INTRAVENOUS | Status: AC
  Administered 2015-03-15: 10:00:00 via INTRAVENOUS
  Filled 2015-03-15: qty 1000

## 2015-03-15 MED ORDER — SODIUM CHLORIDE 0.9 % IV SOLN
510.0000 mg | Freq: Once | INTRAVENOUS | Status: AC
  Administered 2015-03-15: 510 mg via INTRAVENOUS
  Filled 2015-03-15: qty 17

## 2015-03-22 ENCOUNTER — Inpatient Hospital Stay: Payer: PPO

## 2015-03-22 VITALS — BP 169/71 | HR 55 | Temp 98.0°F | Resp 20

## 2015-03-22 DIAGNOSIS — D509 Iron deficiency anemia, unspecified: Secondary | ICD-10-CM | POA: Diagnosis not present

## 2015-03-22 MED ORDER — SODIUM CHLORIDE 0.9 % IV SOLN
Freq: Once | INTRAVENOUS | Status: AC
  Administered 2015-03-22: 10:00:00 via INTRAVENOUS
  Filled 2015-03-22: qty 1000

## 2015-03-22 MED ORDER — FERUMOXYTOL INJECTION 510 MG/17 ML
510.0000 mg | Freq: Once | INTRAVENOUS | Status: AC
  Administered 2015-03-22: 510 mg via INTRAVENOUS
  Filled 2015-03-22: qty 17

## 2015-04-05 ENCOUNTER — Inpatient Hospital Stay: Payer: PPO

## 2015-04-09 DIAGNOSIS — M25562 Pain in left knee: Secondary | ICD-10-CM | POA: Diagnosis not present

## 2015-04-09 DIAGNOSIS — M170 Bilateral primary osteoarthritis of knee: Secondary | ICD-10-CM | POA: Diagnosis not present

## 2015-04-09 DIAGNOSIS — M25561 Pain in right knee: Secondary | ICD-10-CM | POA: Diagnosis not present

## 2015-04-10 ENCOUNTER — Other Ambulatory Visit: Payer: Self-pay | Admitting: *Deleted

## 2015-04-10 DIAGNOSIS — D509 Iron deficiency anemia, unspecified: Secondary | ICD-10-CM

## 2015-04-12 ENCOUNTER — Inpatient Hospital Stay (HOSPITAL_BASED_OUTPATIENT_CLINIC_OR_DEPARTMENT_OTHER): Payer: PPO | Admitting: Family Medicine

## 2015-04-12 ENCOUNTER — Inpatient Hospital Stay: Payer: PPO | Attending: Family Medicine

## 2015-04-12 VITALS — BP 155/61 | HR 52 | Temp 96.1°F | Wt 165.3 lb

## 2015-04-12 DIAGNOSIS — D509 Iron deficiency anemia, unspecified: Secondary | ICD-10-CM | POA: Insufficient documentation

## 2015-04-12 DIAGNOSIS — M1990 Unspecified osteoarthritis, unspecified site: Secondary | ICD-10-CM | POA: Insufficient documentation

## 2015-04-12 DIAGNOSIS — I10 Essential (primary) hypertension: Secondary | ICD-10-CM | POA: Diagnosis not present

## 2015-04-12 DIAGNOSIS — Z79899 Other long term (current) drug therapy: Secondary | ICD-10-CM | POA: Diagnosis not present

## 2015-04-12 DIAGNOSIS — I519 Heart disease, unspecified: Secondary | ICD-10-CM | POA: Insufficient documentation

## 2015-04-12 DIAGNOSIS — Z7982 Long term (current) use of aspirin: Secondary | ICD-10-CM | POA: Diagnosis not present

## 2015-04-12 DIAGNOSIS — K219 Gastro-esophageal reflux disease without esophagitis: Secondary | ICD-10-CM | POA: Insufficient documentation

## 2015-04-12 DIAGNOSIS — E7800 Pure hypercholesterolemia, unspecified: Secondary | ICD-10-CM | POA: Insufficient documentation

## 2015-04-12 LAB — CBC WITH DIFFERENTIAL/PLATELET
BASOS PCT: 1 %
Basophils Absolute: 0 10*3/uL (ref 0–0.1)
EOS ABS: 0.1 10*3/uL (ref 0–0.7)
Eosinophils Relative: 4 %
HEMATOCRIT: 33 % — AB (ref 35.0–47.0)
HEMOGLOBIN: 10.8 g/dL — AB (ref 12.0–16.0)
LYMPHS ABS: 0.6 10*3/uL — AB (ref 1.0–3.6)
Lymphocytes Relative: 14 %
MCH: 27.6 pg (ref 26.0–34.0)
MCHC: 32.8 g/dL (ref 32.0–36.0)
MCV: 84.1 fL (ref 80.0–100.0)
Monocytes Absolute: 0.8 10*3/uL (ref 0.2–0.9)
Monocytes Relative: 19 %
NEUTROS ABS: 2.6 10*3/uL (ref 1.4–6.5)
NEUTROS PCT: 62 %
Platelets: 188 10*3/uL (ref 150–440)
RBC: 3.92 MIL/uL (ref 3.80–5.20)
RDW: 23.6 % — AB (ref 11.5–14.5)
WBC: 4.1 10*3/uL (ref 3.6–11.0)

## 2015-04-12 LAB — IRON AND TIBC
IRON: 34 ug/dL (ref 28–170)
Saturation Ratios: 13 % (ref 10.4–31.8)
TIBC: 269 ug/dL (ref 250–450)
UIBC: 235 ug/dL

## 2015-04-12 LAB — FERRITIN: FERRITIN: 184 ng/mL (ref 11–307)

## 2015-04-12 NOTE — Progress Notes (Signed)
[place]  Telephone:(336) [phone]  Fax:(336) [place] DOB: 1917/07/26  MR#: [id number]  [id number]  Patient Care Team: Lynnell Jude, MD as PCP - General (Family Medicine)  CHIEF COMPLAINT:  Chief Complaint  Patient presents with  . Anemia    Pt is here for follow up of anemia    INTERVAL HISTORY:  Patient is here for evaluation and treatment of iron deficiency anemia since 2014, with hemoglobin ranging between 6.5 and 8.2 g/dL. Review of previous medical records indicate that as far back as 2003 hemoglobin already was low at 10 g/dL. Our workup was consistent with severe iron deficiency anemia, but performed fecal occult blood tests were negative. She received Feraheme 510mg  on February 2nd and 9th. She is having some issues with her pessary with some slight hematuria. She is following with her PCP as her GYN doctor has relocated. She reports overall feeling well.   REVIEW OF SYSTEMS:   Review of Systems  Constitutional: Positive for malaise/fatigue. Negative for fever, chills, weight loss and diaphoresis.  HENT: Negative.   Eyes: Negative.   Respiratory: Negative for cough, hemoptysis, sputum production, shortness of breath and wheezing.   Cardiovascular: Negative for chest pain, palpitations, orthopnea, claudication, leg swelling and PND.  Gastrointestinal: Negative for heartburn, nausea, vomiting, abdominal pain, diarrhea, constipation, blood in stool and melena.  Genitourinary: Positive for hematuria.       Having issues with pessary, following with Dr. Clemmie Krill  Musculoskeletal: Negative.   Skin: Negative.   Neurological: Positive for weakness. Negative for dizziness, tingling, focal weakness and seizures.  Endo/Heme/Allergies: Does not bruise/bleed easily.  Psychiatric/Behavioral: Negative for depression. The patient has insomnia. The patient is not nervous/anxious.     As per HPI. Otherwise, a complete review of systems is  negatve.   PAST MEDICAL HISTORY: Past Medical History  Diagnosis Date  . GERD (gastroesophageal reflux disease)   . Hypertension   . Hypercholesteremia   . Insomnia   . Arthritis   . Anemia   . Heart disease     PAST SURGICAL HISTORY: Past Surgical History  Procedure Laterality Date  . Cardiac surgery  2005  . Tonsillectomy    . Toe removal      FAMILY HISTORY History reviewed. No pertinent family history.  GYNECOLOGIC HISTORY:  No LMP recorded.     ADVANCED DIRECTIVES:    HEALTH MAINTENANCE: Social History  Substance Use Topics  . Smoking status: Never Smoker   . Smokeless tobacco: Never Used  . Alcohol Use: No    Allergies  Allergen Reactions  . Amoxicillin Rash    Current Outpatient Prescriptions  Medication Sig Dispense Refill  . acetaminophen (TYLENOL) 325 MG tablet Take 325 mg by mouth daily.    Marland Kitchen aspirin 81 MG tablet Take 81 mg by mouth daily.    . hydrochlorothiazide (HYDRODIURIL) 25 MG tablet Take 25 mg by mouth daily.    Marland Kitchen lovastatin (MEVACOR) 40 MG tablet Take 40 mg by mouth at bedtime.    . metoprolol tartrate (LOPRESSOR) 25 MG tablet Take 25 mg by mouth 2 (two) times daily.    Marland Kitchen omeprazole (PRILOSEC) 20 MG capsule Take 20 mg by mouth daily.     No current facility-administered medications for this visit.    OBJECTIVE: BP 155/61 mmHg  Pulse 52  Temp(Src) 96.1 F (35.6 C) (Tympanic)  Wt 165 lb 5.5 oz (75 kg)   Body mass index is 28.37 kg/(m^2).  ECOG FS:1 - Symptomatic but completely ambulatory  General: Well-developed, well-nourished, no acute distress. Eyes: Pink conjunctiva, anicteric sclera. HEENT: Normocephalic, moist mucous membranes, clear oropharnyx. Lungs: Clear to auscultation bilaterally. Heart: Regular rate and rhythm. No rubs, murmurs, or gallops. Abdomen: Soft, nontender, nondistended. No organomegaly noted, normoactive bowel sounds. Neuro: Alert, answering all questions appropriately. Cranial nerves grossly  intact. Skin: No rashes or petechiae noted. Psych: Normal affect. Lymphatics: No cervical, clavicular LAD.   LAB RESULTS:  Appointment on 04/12/2015  Component Date Value Ref Range Status  . WBC 04/12/2015 4.1  3.6 - 11.0 K/uL Final  . RBC 04/12/2015 3.92  3.80 - 5.20 MIL/uL Final  . Hemoglobin 04/12/2015 10.8* 12.0 - 16.0 g/dL Final  . HCT 04/12/2015 33.0* 35.0 - 47.0 % Final  . MCV 04/12/2015 84.1  80.0 - 100.0 fL Final  . MCH 04/12/2015 27.6  26.0 - 34.0 pg Final  . MCHC 04/12/2015 32.8  32.0 - 36.0 g/dL Final  . RDW 04/12/2015 23.6* 11.5 - 14.5 % Final  . Platelets 04/12/2015 188  150 - 440 K/uL Final  . Neutrophils Relative % 04/12/2015 62   Final  . Neutro Abs 04/12/2015 2.6  1.4 - 6.5 K/uL Final  . Lymphocytes Relative 04/12/2015 14   Final  . Lymphs Abs 04/12/2015 0.6* 1.0 - 3.6 K/uL Final  . Monocytes Relative 04/12/2015 19   Final  . Monocytes Absolute 04/12/2015 0.8  0.2 - 0.9 K/uL Final  . Eosinophils Relative 04/12/2015 4   Final  . Eosinophils Absolute 04/12/2015 0.1  0 - 0.7 K/uL Final  . Basophils Relative 04/12/2015 1   Final  . Basophils Absolute 04/12/2015 0.0  0 - 0.1 K/uL Final    STUDIES: No results found.  ASSESSMENT:  Iron deficiency anemia.   PLAN:  1. IDA. IDA most likely secondary to poor absorption, as fecal occult blood test was negative x2. She has most recently received feraheme on February 2nd and 9th with hgb rising from 9.9- 10.8, ferritin up from 26-184. She will not require any further feraheme infusions at this time.  Will recheck labs again in 6 weeks and see provider with labs again in 12 weeks.   Patient expressed understanding and was in agreement with this plan. She also understands that She can call clinic at any time with any questions, concerns, or complaints.   Dr. Oliva Bustard was available for consultation and review of plan of care for this patient.   Evlyn Kanner, NP   04/12/2015 10:36 AM

## 2015-05-17 ENCOUNTER — Inpatient Hospital Stay: Payer: PPO | Attending: Family Medicine

## 2015-05-17 DIAGNOSIS — D509 Iron deficiency anemia, unspecified: Secondary | ICD-10-CM | POA: Insufficient documentation

## 2015-05-17 LAB — CBC WITH DIFFERENTIAL/PLATELET
BASOS ABS: 0 10*3/uL (ref 0–0.1)
Basophils Relative: 1 %
EOS PCT: 7 %
Eosinophils Absolute: 0.4 10*3/uL (ref 0–0.7)
HCT: 32.8 % — ABNORMAL LOW (ref 35.0–47.0)
Hemoglobin: 11 g/dL — ABNORMAL LOW (ref 12.0–16.0)
Lymphocytes Relative: 19 %
Lymphs Abs: 1.1 10*3/uL (ref 1.0–3.6)
MCH: 28.7 pg (ref 26.0–34.0)
MCHC: 33.4 g/dL (ref 32.0–36.0)
MCV: 85.8 fL (ref 80.0–100.0)
MONO ABS: 0.7 10*3/uL (ref 0.2–0.9)
MONOS PCT: 12 %
Neutro Abs: 3.6 10*3/uL (ref 1.4–6.5)
Neutrophils Relative %: 61 %
PLATELETS: 242 10*3/uL (ref 150–440)
RBC: 3.83 MIL/uL (ref 3.80–5.20)
RDW: 17.3 % — AB (ref 11.5–14.5)
WBC: 5.8 10*3/uL (ref 3.6–11.0)

## 2015-05-24 ENCOUNTER — Other Ambulatory Visit: Payer: PPO

## 2015-05-24 DIAGNOSIS — N39498 Other specified urinary incontinence: Secondary | ICD-10-CM | POA: Diagnosis not present

## 2015-05-24 DIAGNOSIS — D509 Iron deficiency anemia, unspecified: Secondary | ICD-10-CM | POA: Diagnosis not present

## 2015-05-24 DIAGNOSIS — N765 Ulceration of vagina: Secondary | ICD-10-CM | POA: Diagnosis not present

## 2015-05-29 ENCOUNTER — Telehealth: Payer: Self-pay | Admitting: Family Medicine

## 2015-05-29 NOTE — Telephone Encounter (Signed)
Reschedule her for 3 months per VO L Herring, AGNP-C

## 2015-05-29 NOTE — Telephone Encounter (Signed)
Patient cancelled May appt and said she just saw her pcp last week on 05/24/15. She said she feels good and since she has just been evaluated by a doctor she didn't see the point in having another appt so soon. She said to please advise when you'd like her to return but not so soon please per pt request.

## 2015-06-11 DIAGNOSIS — Z4689 Encounter for fitting and adjustment of other specified devices: Secondary | ICD-10-CM | POA: Diagnosis not present

## 2015-06-11 DIAGNOSIS — N765 Ulceration of vagina: Secondary | ICD-10-CM | POA: Diagnosis not present

## 2015-06-28 ENCOUNTER — Ambulatory Visit: Payer: PPO

## 2015-06-28 ENCOUNTER — Other Ambulatory Visit: Payer: PPO

## 2015-07-05 ENCOUNTER — Ambulatory Visit: Payer: PPO

## 2015-07-05 ENCOUNTER — Other Ambulatory Visit: Payer: PPO

## 2015-08-22 DIAGNOSIS — M25562 Pain in left knee: Secondary | ICD-10-CM | POA: Diagnosis not present

## 2015-08-22 DIAGNOSIS — R262 Difficulty in walking, not elsewhere classified: Secondary | ICD-10-CM | POA: Diagnosis not present

## 2015-08-22 DIAGNOSIS — M25561 Pain in right knee: Secondary | ICD-10-CM | POA: Diagnosis not present

## 2015-08-22 DIAGNOSIS — M170 Bilateral primary osteoarthritis of knee: Secondary | ICD-10-CM | POA: Diagnosis not present

## 2015-10-04 ENCOUNTER — Other Ambulatory Visit: Payer: PPO

## 2015-10-04 ENCOUNTER — Ambulatory Visit: Payer: PPO | Admitting: Family Medicine

## 2015-10-09 NOTE — Progress Notes (Signed)
[facility] day:  10/10/2015  Chief Complaint: Alexis Haas is a 82 y.o. female with iron deficiency anemia who is seen for reassessment.  HPI:  The patient was initially seen by Roxana Hires on 01/02/2015.  At that time, she was referred for persistent microcytic anemia.  She was noted to have anemia since 2003 with a hemoglobin around 10.  She has had documented iron deficiency anemia since 2014. Hemoglobin ranged between 6.5 and 8.2.    At that time, she had been taking oral iron twice a day for 7 months.  Given her age, decision was made for no invasive procedures (EGD or colonoscopy).  Guaiac cards were negative.  She was felt to have iron deficiency anemia secondary to poor absorption. She was scheduled for IV iron.  She received Feraheme on 01/11/2015, 02/01/2015, 03/15/2015 and 03/22/2015.    CBC on 01/02/2015 revealed a hematocrit of 24.9, hemoglobin 7.7, and MCV 65.6.  Ferritin was 5.  CBC on 02/01/2015 revealed a hematocrit of 28.1, hemoglobin 8.7, and MCV 72.0.  Ferritin was 36.  CBC on 03/08/2015 revealed a hematocrit 30.5, hemoglobin 9.9, and MCV 78.5  Ferritin was 26.  CBC on 04/12/2015 revealed a hematocrit was 33.0, hemoglobin 10.8, and MCV 84.1. Ferritin was 184.  The patient was last seen in the hematology clinic by Georgeanne Nim, NP on 04/12/2015.  At that time, she was fatigued.  She had issues with her pessary and was following with Dr. Clemmie Krill.  She did not receive any iron.  CBC on 05/17/2015 revealed a hematocrit of 32.8, hemoglobin 11.0, MCV 85.8, and platelets 242,000.  Regarding her diet, she states that "I can eat". She does not eat much meat. She eats grits and coffee for breakfast. She has lunch at 3 PM. She "nibbles a lot".  She  denies any pica.   Symptomatically, her energy level is fair.  She denies any chest pain or shortness of breath.  She denies any melena, hematochezia, hematuria, or vaginal  bleeding.   Past Medical History:  Diagnosis Date  . Anemia   . Arthritis   . GERD (gastroesophageal reflux disease)   . Heart disease   . Hypercholesteremia   . Hypertension   . Insomnia     Past Surgical History:  Procedure Laterality Date  . CARDIAC SURGERY  2005  . toe removal    . TONSILLECTOMY      History reviewed. No pertinent family history.  Social History:  reports that she has never smoked. She has never used smokeless tobacco. She reports that she does not drink alcohol or use drugs.  She states that she has worked her whole life (since the age of 86).  She works ar [employer] part time.  She is head of the shoe department.  She lives in [place] at [place].  She lives alone.  Her husband died 72 years ago.  The patient is alone today.  Allergies:  Allergies  Allergen Reactions  . Amoxicillin Rash    Current Medications: Current Outpatient Prescriptions  Medication Sig Dispense Refill  . acetaminophen (TYLENOL) 325 MG tablet Take 325 mg by mouth daily.    Marland Kitchen aspirin 81 MG tablet Take 81 mg by mouth daily.    . hydrochlorothiazide (HYDRODIURIL) 25 MG tablet Take 25 mg by mouth daily.    Marland Kitchen lovastatin (MEVACOR) 40 MG tablet Take 40 mg by mouth at bedtime.    . metoprolol tartrate (LOPRESSOR) 25 MG  tablet Take 25 mg by mouth 2 (two) times daily.    Marland Kitchen omeprazole (PRILOSEC) 20 MG capsule Take 20 mg by mouth daily.     No current facility-administered medications for this visit.     Review of Systems:  GENERAL:  Fair energy.  No fevers, sweats or weight loss. PERFORMANCE STATUS (ECOG):  1 HEENT:  No visual changes, runny nose, sore throat, mouth sores or tenderness. Lungs: No shortness of breath or cough.  No hemoptysis. Cardiac:  No chest pain, palpitations, orthopnea, or PND. GI:  No nausea, vomiting, diarrhea, constipation, melena or hematochezia. GU:  No urgency, frequency, dysuria, or hematuria. Musculoskeletal:  No back pain.  Arthritis in knees.  Can't  lift right arm fully up secondary to arthritis.  No muscle tenderness. Extremities:  No pain or swelling. Skin:  No rashes or skin changes. Neuro:  No headache, numbness or weakness, balance or coordination issues. Endocrine:  No diabetes, thyroid issues, hot flashes or night sweats. Psych:  Poor sleep at night.  No mood changes, depression or anxiety. Pain:  No focal pain. Review of systems:  All other systems reviewed and found to be negative.  Physical Exam: Blood pressure (!) 148/66, pulse (!) 50, temperature 97.4 F (36.3 C), temperature source Tympanic, resp. rate 18, weight 170 lb 4.9 oz (77.3 kg). GENERAL:  Well developed, well nourished, sitting comfortably in the exam room in no acute distress.  She has a 4 prong cane at her side. MENTAL STATUS:  Alert and oriented to person, place and time. HEAD:  Curly gray hair.  Normocephalic, atraumatic, face symmetric, no Cushingoid features. EYES:  Glasses.  Blue eyes.  Pupils equal round and reactive to light and accomodation.  No conjunctivitis or scleral icterus. ENT:  Oropharynx clear without lesion.  Dentures.  Tongue normal. Mucous membranes moist.  RESPIRATORY:  Clear to auscultation without rales, wheezes or rhonchi. CARDIOVASCULAR:  Regular rate and rhythm without murmur, rub or gallop.  No JVD. ABDOMEN:  Soft, non-tender, with active bowel sounds, and no hepatosplenomegaly.  No masses. SKIN:  No rashes, ulcers or lesions. EXTREMITIES: Arthritic changes in hands.  Left knee brace.  No edema, no skin discoloration or tenderness.  No palpable cords. LYMPH NODES: No palpable cervical, supraclavicular, axillary or inguinal adenopathy  NEUROLOGICAL: Unremarkable. PSYCH:  Appropriate.   Appointment on 10/10/2015  Component Date Value Ref Range Status  . WBC 10/10/2015 5.6  3.6 - 11.0 K/uL Final  . RBC 10/10/2015 3.13* 3.80 - 5.20 MIL/uL Final  . Hemoglobin 10/10/2015 6.4* 12.0 - 16.0 g/dL Final  . HCT 10/10/2015 21.1* 35.0 - 47.0 %  Final  . MCV 10/10/2015 67.3* 80.0 - 100.0 fL Final  . MCH 10/10/2015 20.5* 26.0 - 34.0 pg Final  . MCHC 10/10/2015 30.5* 32.0 - 36.0 g/dL Final  . RDW 10/10/2015 16.4* 11.5 - 14.5 % Final  . Platelets 10/10/2015 286  150 - 440 K/uL Final  . Neutrophils Relative % 10/10/2015 59  % Final  . Neutro Abs 10/10/2015 3.3  1.4 - 6.5 K/uL Final  . Lymphocytes Relative 10/10/2015 21  % Final  . Lymphs Abs 10/10/2015 1.2  1.0 - 3.6 K/uL Final  . Monocytes Relative 10/10/2015 13  % Final  . Monocytes Absolute 10/10/2015 0.7  0.2 - 0.9 K/uL Final  . Eosinophils Relative 10/10/2015 6  % Final  . Eosinophils Absolute 10/10/2015 0.3  0 - 0.7 K/uL Final  . Basophils Relative 10/10/2015 1  % Final  . Basophils Absolute  10/10/2015 0.0  0 - 0.1 K/uL Final    Assessment:  ARELYN SWOFFORD is a 82 y.o. female with iron deficiency anemia felt secondary to poor absorption.    Given her age, decision was made for no invasive procedures (EGD or colonoscopy).  Guaiac cards were negative in 12/2014.  Diet is modest.  She notes oral iron is ineffective.  She has received Feraheme on several occasions:  01/11/2015, 02/01/2015, 03/15/2015 and 03/22/2015.    Hematocrit improved from 24.9 with a hemoglobin of 7.7 on 01/02/2015 to a hematocrit of 33.0 with a hemoglobin of 10.8 on 04/12/2015.  MCV improved from 65.6 to 84.1. Ferritin improved from 5 to 184.  Symptomatically, she feels "fair".  She denies any melena, hematochezia, hematuria or vaginal bleeding.  Hematocrit is 21.1 and hemoglobin 6.4.  Ferritin is 7.  Plan: 1.  Discuss diagnosis and management of iron deficiency. Discuss improvement with her blood counts after IV iron in the past.  Discuss etiology is either poor intake or loss of blood with "holes in the bucket".  Discuss baseline labs.  Discuss guaiac cards and urinalysis to look for GI and GU bleeding. 2.  Labs today: CBC with diff, ferritin, iron studies, urinalysis. 3.  Type and screen today 4.  UA  and stool guaiac cards x 3 5.  Patient considering PRBC transfusion 6.  Preauth Feraheme 7.  RTC for Feraheme ASAP 8.  RTC in 1 month for MD assess, review of work-up and labs (CBC with diff, ferritin).  Addendum:  Patient agreed to transfusion after discussion with my nurse, Moishe Spice.  She will be transfused in [employer].   Lequita Asal, MD  10/10/2015, 12:23 PM

## 2015-10-10 ENCOUNTER — Encounter: Payer: Self-pay | Admitting: Hematology and Oncology

## 2015-10-10 ENCOUNTER — Inpatient Hospital Stay (HOSPITAL_BASED_OUTPATIENT_CLINIC_OR_DEPARTMENT_OTHER): Payer: PPO | Admitting: Hematology and Oncology

## 2015-10-10 ENCOUNTER — Other Ambulatory Visit: Payer: Self-pay | Admitting: *Deleted

## 2015-10-10 ENCOUNTER — Inpatient Hospital Stay: Payer: PPO | Attending: Hematology and Oncology

## 2015-10-10 DIAGNOSIS — D509 Iron deficiency anemia, unspecified: Secondary | ICD-10-CM

## 2015-10-10 DIAGNOSIS — K219 Gastro-esophageal reflux disease without esophagitis: Secondary | ICD-10-CM | POA: Diagnosis not present

## 2015-10-10 DIAGNOSIS — M1990 Unspecified osteoarthritis, unspecified site: Secondary | ICD-10-CM | POA: Insufficient documentation

## 2015-10-10 DIAGNOSIS — Z79899 Other long term (current) drug therapy: Secondary | ICD-10-CM | POA: Diagnosis not present

## 2015-10-10 DIAGNOSIS — Z7982 Long term (current) use of aspirin: Secondary | ICD-10-CM | POA: Insufficient documentation

## 2015-10-10 DIAGNOSIS — I10 Essential (primary) hypertension: Secondary | ICD-10-CM | POA: Diagnosis not present

## 2015-10-10 LAB — CBC WITH DIFFERENTIAL/PLATELET
Basophils Absolute: 0 10*3/uL (ref 0–0.1)
Basophils Relative: 1 %
Eosinophils Absolute: 0.3 10*3/uL (ref 0–0.7)
Eosinophils Relative: 6 %
HCT: 21.1 % — ABNORMAL LOW (ref 35.0–47.0)
Hemoglobin: 6.4 g/dL — ABNORMAL LOW (ref 12.0–16.0)
Lymphocytes Relative: 21 %
Lymphs Abs: 1.2 10*3/uL (ref 1.0–3.6)
MCH: 20.5 pg — ABNORMAL LOW (ref 26.0–34.0)
MCHC: 30.5 g/dL — ABNORMAL LOW (ref 32.0–36.0)
MCV: 67.3 fL — ABNORMAL LOW (ref 80.0–100.0)
Monocytes Absolute: 0.7 10*3/uL (ref 0.2–0.9)
Monocytes Relative: 13 %
Neutro Abs: 3.3 10*3/uL (ref 1.4–6.5)
Neutrophils Relative %: 59 %
Platelets: 286 10*3/uL (ref 150–440)
RBC: 3.13 MIL/uL — ABNORMAL LOW (ref 3.80–5.20)
RDW: 16.4 % — ABNORMAL HIGH (ref 11.5–14.5)
WBC: 5.6 10*3/uL (ref 3.6–11.0)

## 2015-10-10 LAB — ABO/RH: ABO/RH(D): A POS

## 2015-10-10 LAB — IRON AND TIBC
Iron: 10 ug/dL — ABNORMAL LOW (ref 28–170)
Saturation Ratios: 2 % — ABNORMAL LOW (ref 10.4–31.8)
TIBC: 449 ug/dL (ref 250–450)
UIBC: 439 ug/dL

## 2015-10-10 LAB — FERRITIN: Ferritin: 7 ng/mL — ABNORMAL LOW (ref 11–307)

## 2015-10-10 LAB — PREPARE RBC (CROSSMATCH)

## 2015-10-10 NOTE — Progress Notes (Signed)
Patient is here today for follow up. She has no complaints today. She is on a beta blocker so her pulse was low 50.

## 2015-10-11 ENCOUNTER — Inpatient Hospital Stay: Payer: PPO

## 2015-10-11 DIAGNOSIS — D509 Iron deficiency anemia, unspecified: Secondary | ICD-10-CM | POA: Diagnosis not present

## 2015-10-11 MED ORDER — SODIUM CHLORIDE 0.9 % IV SOLN
250.0000 mL | Freq: Once | INTRAVENOUS | Status: AC
  Administered 2015-10-11: 250 mL via INTRAVENOUS
  Filled 2015-10-11: qty 250

## 2015-10-11 MED ORDER — DIPHENHYDRAMINE HCL 25 MG PO CAPS
25.0000 mg | ORAL_CAPSULE | Freq: Once | ORAL | Status: AC
  Administered 2015-10-11: 25 mg via ORAL
  Filled 2015-10-11: qty 1

## 2015-10-11 MED ORDER — ACETAMINOPHEN 325 MG PO TABS
650.0000 mg | ORAL_TABLET | Freq: Once | ORAL | Status: AC
  Administered 2015-10-11: 650 mg via ORAL
  Filled 2015-10-11: qty 2

## 2015-10-12 LAB — TYPE AND SCREEN
ABO/RH(D): A POS
Antibody Screen: NEGATIVE
Unit division: 0
Unit division: 0
Unit division: 0
Unit division: 0

## 2015-10-16 ENCOUNTER — Inpatient Hospital Stay: Payer: PPO | Attending: Hematology and Oncology

## 2015-10-16 VITALS — BP 108/53 | HR 56 | Temp 98.4°F | Resp 20

## 2015-10-16 DIAGNOSIS — D509 Iron deficiency anemia, unspecified: Secondary | ICD-10-CM | POA: Diagnosis not present

## 2015-10-16 DIAGNOSIS — E7800 Pure hypercholesterolemia, unspecified: Secondary | ICD-10-CM | POA: Diagnosis not present

## 2015-10-16 DIAGNOSIS — M1990 Unspecified osteoarthritis, unspecified site: Secondary | ICD-10-CM | POA: Insufficient documentation

## 2015-10-16 DIAGNOSIS — Z79899 Other long term (current) drug therapy: Secondary | ICD-10-CM | POA: Diagnosis not present

## 2015-10-16 DIAGNOSIS — I10 Essential (primary) hypertension: Secondary | ICD-10-CM | POA: Diagnosis not present

## 2015-10-16 DIAGNOSIS — R634 Abnormal weight loss: Secondary | ICD-10-CM | POA: Diagnosis not present

## 2015-10-16 DIAGNOSIS — K219 Gastro-esophageal reflux disease without esophagitis: Secondary | ICD-10-CM | POA: Diagnosis not present

## 2015-10-16 DIAGNOSIS — Z7982 Long term (current) use of aspirin: Secondary | ICD-10-CM | POA: Diagnosis not present

## 2015-10-16 LAB — OCCULT BLOOD X 1 CARD TO LAB, STOOL
Fecal Occult Bld: NEGATIVE
Fecal Occult Bld: NEGATIVE
Fecal Occult Bld: NEGATIVE

## 2015-10-16 MED ORDER — SODIUM CHLORIDE 0.9 % IV SOLN
510.0000 mg | Freq: Once | INTRAVENOUS | Status: AC
  Administered 2015-10-16: 510 mg via INTRAVENOUS
  Filled 2015-10-16: qty 17

## 2015-10-16 MED ORDER — SODIUM CHLORIDE 0.9 % IV SOLN
Freq: Once | INTRAVENOUS | Status: AC
  Administered 2015-10-16: 14:00:00 via INTRAVENOUS
  Filled 2015-10-16: qty 1000

## 2015-11-07 ENCOUNTER — Encounter: Payer: Self-pay | Admitting: Hematology and Oncology

## 2015-11-07 ENCOUNTER — Inpatient Hospital Stay (HOSPITAL_BASED_OUTPATIENT_CLINIC_OR_DEPARTMENT_OTHER): Payer: PPO | Admitting: Hematology and Oncology

## 2015-11-07 ENCOUNTER — Inpatient Hospital Stay: Payer: PPO

## 2015-11-07 VITALS — BP 154/83 | HR 59 | Temp 96.7°F | Resp 18 | Wt 162.9 lb

## 2015-11-07 DIAGNOSIS — D509 Iron deficiency anemia, unspecified: Secondary | ICD-10-CM

## 2015-11-07 DIAGNOSIS — R634 Abnormal weight loss: Secondary | ICD-10-CM | POA: Diagnosis not present

## 2015-11-07 DIAGNOSIS — Z79899 Other long term (current) drug therapy: Secondary | ICD-10-CM

## 2015-11-07 HISTORY — DX: Abnormal weight loss: R63.4

## 2015-11-07 LAB — CBC WITH DIFFERENTIAL/PLATELET
Basophils Absolute: 0 10*3/uL (ref 0–0.1)
Basophils Relative: 1 %
Eosinophils Absolute: 0.2 10*3/uL (ref 0–0.7)
Eosinophils Relative: 3 %
HCT: 35.3 % (ref 35.0–47.0)
Hemoglobin: 11.6 g/dL — ABNORMAL LOW (ref 12.0–16.0)
Lymphocytes Relative: 16 %
Lymphs Abs: 1 10*3/uL (ref 1.0–3.6)
MCH: 25.4 pg — ABNORMAL LOW (ref 26.0–34.0)
MCHC: 32.8 g/dL (ref 32.0–36.0)
MCV: 77.5 fL — ABNORMAL LOW (ref 80.0–100.0)
Monocytes Absolute: 0.5 10*3/uL (ref 0.2–0.9)
Monocytes Relative: 7 %
Neutro Abs: 4.5 10*3/uL (ref 1.4–6.5)
Neutrophils Relative %: 73 %
Platelets: 289 10*3/uL (ref 150–440)
RBC: 4.56 MIL/uL (ref 3.80–5.20)
RDW: 28.3 % — ABNORMAL HIGH (ref 11.5–14.5)
WBC: 6.2 10*3/uL (ref 3.6–11.0)

## 2015-11-07 LAB — FERRITIN: Ferritin: 91 ng/mL (ref 11–307)

## 2015-11-07 NOTE — Progress Notes (Signed)
[facility] day:  11/07/2015  Chief Complaint: Alexis Haas is a 82 y.o. female with iron deficiency anemia who is seen for 1 month assessment after transfusion and IV iron.  HPI:  The patient was last seen in the hematology clinic on 10/10/2015.  At that time, she was seen for initial assessment by me.   She feelt "fair".  She denied any melena, hematochezia, hematuria or vaginal bleeding.  Hematocrit was 21.1 and hemoglobin 6.4.  Ferritin was 7.  She received 2 units of PRBCs on 10/11/2015.  She received Feraheme 510 mg on 10/16/2015.  Guaiac cards x 3 were negative.  During the interim, she notes "some energy".  She describes inner ear spells every 3 months.  She notes that she has had a spell "lately".  She denies any chest pain or shortness of breath.  She denies any melena, hematochezia, hematuria, or vaginal bleeding.  She is eating, but losing weight.   Past Medical History:  Diagnosis Date  . Anemia   . Arthritis   . GERD (gastroesophageal reflux disease)   . Heart disease   . Hypercholesteremia   . Hypertension   . Insomnia     Past Surgical History:  Procedure Laterality Date  . CARDIAC SURGERY  2005  . toe removal    . TONSILLECTOMY      History reviewed. No pertinent family history.  Social History:  reports that she has never smoked. She has never used smokeless tobacco. She reports that she does not drink alcohol or use drugs.  She states that she has worked her whole life (since the age of 71).  She works ar [employer] part time.  She is head of the shoe department.  She lives in [place] at [place].  She lives alone.  Her husband died 32 years ago.  The patient is accompanied by her daughter today.  Allergies:  Allergies  Allergen Reactions  . Amoxicillin Rash    Current Medications: Current Outpatient Prescriptions  Medication Sig Dispense Refill  . acetaminophen (TYLENOL) 325 MG tablet Take 325 mg by mouth  daily.    Marland Kitchen aspirin 81 MG tablet Take 81 mg by mouth daily.    . hydrochlorothiazide (HYDRODIURIL) 25 MG tablet Take 25 mg by mouth daily.    Marland Kitchen lovastatin (MEVACOR) 40 MG tablet Take 40 mg by mouth at bedtime.    . metoprolol tartrate (LOPRESSOR) 25 MG tablet Take 25 mg by mouth 2 (two) times daily.    . naproxen sodium (ANAPROX) 220 MG tablet Take 220 mg by mouth daily as needed.    Marland Kitchen omeprazole (PRILOSEC) 20 MG capsule Take 20 mg by mouth daily.     No current facility-administered medications for this visit.     Review of Systems:  GENERAL:  "Some energy".  No fevers or sweats.  Weight loss of 8 pounds. PERFORMANCE STATUS (ECOG):  1 HEENT:  Inner ear issues.  No visual changes, runny nose, sore throat, mouth sores or tenderness. Lungs: No shortness of breath or cough.  No hemoptysis. Cardiac:  No chest pain, palpitations, orthopnea, or PND. GI:  No nausea, vomiting, diarrhea, constipation, melena or hematochezia. GU:  No urgency, frequency, dysuria, or hematuria. Musculoskeletal:  No back pain.  Arthritis in knees.  Can't lift right arm fully up secondary to arthritis.  No muscle tenderness. Extremities:  No pain or swelling. Skin:  No rashes or skin changes. Neuro:  No headache, numbness or  weakness, balance or coordination issues. Endocrine:  No diabetes, thyroid issues, hot flashes or night sweats. Psych:  Poor sleep at night.  No mood changes, depression or anxiety. Pain:  No focal pain. Review of systems:  All other systems reviewed and found to be negative.  Physical Exam: Blood pressure (!) 182/71, pulse (!) 59, temperature (!) 96.7 F (35.9 C), temperature source Tympanic, resp. rate 18, weight 162 lb 14.7 oz (73.9 kg). GENERAL:  Well developed, well nourished, sitting comfortably in the exam room in no acute distress.  She has a 4 prong cane at her side. MENTAL STATUS:  Alert and oriented to person, place and time. HEAD:  Curly gray hair.  Normocephalic, atraumatic, face  symmetric, no Cushingoid features. EYES:  Glasses.  Blue eyes.  Pupils equal round and reactive to light and accomodation.  No conjunctivitis or scleral icterus. ENT:  Oropharynx clear without lesion.  Dentures.  Tongue normal. Mucous membranes moist.  RESPIRATORY:  Clear to auscultation without rales, wheezes or rhonchi. CARDIOVASCULAR:  Regular rate and rhythm without murmur, rub or gallop.  No JVD. ABDOMEN:  Soft, non-tender, with active bowel sounds, and no hepatosplenomegaly.  No masses. SKIN:  No rashes, ulcers or lesions. EXTREMITIES: Arthritic changes in hands.  Left knee brace.  Mild chronic left lower extremity edema below the brace.  No skin discoloration or tenderness.  No palpable cords. LYMPH NODES: No palpable cervical, supraclavicular, axillary or inguinal adenopathy  NEUROLOGICAL: Unremarkable. PSYCH:  Appropriate.   Appointment on 11/07/2015  Component Date Value Ref Range Status  . WBC 11/07/2015 6.2  3.6 - 11.0 K/uL Final  . RBC 11/07/2015 4.56  3.80 - 5.20 MIL/uL Final  . Hemoglobin 11/07/2015 11.6* 12.0 - 16.0 g/dL Final  . HCT 11/07/2015 35.3  35.0 - 47.0 % Final  . MCV 11/07/2015 77.5* 80.0 - 100.0 fL Final  . MCH 11/07/2015 25.4* 26.0 - 34.0 pg Final  . MCHC 11/07/2015 32.8  32.0 - 36.0 g/dL Final  . RDW 11/07/2015 28.3* 11.5 - 14.5 % Final  . Platelets 11/07/2015 289  150 - 440 K/uL Final  . Neutrophils Relative % 11/07/2015 73  % Final  . Neutro Abs 11/07/2015 4.5  1.4 - 6.5 K/uL Final  . Lymphocytes Relative 11/07/2015 16  % Final  . Lymphs Abs 11/07/2015 1.0  1.0 - 3.6 K/uL Final  . Monocytes Relative 11/07/2015 7  % Final  . Monocytes Absolute 11/07/2015 0.5  0.2 - 0.9 K/uL Final  . Eosinophils Relative 11/07/2015 3  % Final  . Eosinophils Absolute 11/07/2015 0.2  0 - 0.7 K/uL Final  . Basophils Relative 11/07/2015 1  % Final  . Basophils Absolute 11/07/2015 0.0  0 - 0.1 K/uL Final    Assessment:  Alexis Haas is a 82 y.o. female with iron  deficiency anemia felt secondary to poor absorption.    Given her age, decision was made for no invasive procedures (EGD or colonoscopy).  Guaiac cards were negative in 12/2014.  Diet is modest.  She notes oral iron is ineffective.  She received 2 units PRBCs on 10/11/2015.  She has received Feraheme on several occasions:  01/11/2015, 02/01/2015, 03/15/2015, 03/22/2015, and 10/16/2015.    She has received IV iron with improvement in her ferritin and hematocrit, but with subsequent decline after 3 months.  She has no obvious bleeding.  Guaiac cards x 3 were negative (10/2015).  Diet remains modest.  Symptomatically, she is losing weight unexpectantly.  Her energy level has improved  post PRBCs and Feraheme.  Hematocrit has improved from 21.1 to 35.3.  Ferritin was 7 (today's level is pending).  Plan: 1.  Labs today:  CBC with diff, ferritin. 2.  Discuss weight loss.  Check thyroid function studies.  If continues, may need imaging studies and consideration of endoscopy. 3.  Discuss improvement in hematocrit and hemoglobin.  Ferritin is pending (goal 100). Discuss pattern in past year of decline in hematocrit 3 months after IV iron.  Guaiac cards negative.  Diet modest.  Etiology unclear. 4.  RTC in 2 months for MD assessment, labs (CBC with diff, ferritin, iron studies, TSH, free T4- day before), and  +/- Feraheme   Lequita Asal, MD  11/07/2015, 4:11 PM

## 2015-11-07 NOTE — Progress Notes (Signed)
Pt doing good, she is tired a lot.  She has some swelling on left leg and it is chronic. Does not sleep well but has sleep med from PCP but it does not work well and I encouraged her to speak to PCP.  Eating and drinking well. Bowel & bladder without problems. No blood in stool or urine

## 2015-11-08 ENCOUNTER — Other Ambulatory Visit: Payer: Self-pay | Admitting: *Deleted

## 2015-11-08 DIAGNOSIS — R634 Abnormal weight loss: Secondary | ICD-10-CM

## 2015-11-08 DIAGNOSIS — D509 Iron deficiency anemia, unspecified: Secondary | ICD-10-CM

## 2015-11-15 DIAGNOSIS — I2510 Atherosclerotic heart disease of native coronary artery without angina pectoris: Secondary | ICD-10-CM | POA: Diagnosis not present

## 2015-11-15 DIAGNOSIS — Z23 Encounter for immunization: Secondary | ICD-10-CM | POA: Diagnosis not present

## 2015-11-15 DIAGNOSIS — R634 Abnormal weight loss: Secondary | ICD-10-CM | POA: Diagnosis not present

## 2015-11-15 DIAGNOSIS — M1712 Unilateral primary osteoarthritis, left knee: Secondary | ICD-10-CM | POA: Diagnosis not present

## 2015-11-15 DIAGNOSIS — N39498 Other specified urinary incontinence: Secondary | ICD-10-CM | POA: Diagnosis not present

## 2015-11-15 DIAGNOSIS — Z6829 Body mass index (BMI) 29.0-29.9, adult: Secondary | ICD-10-CM | POA: Diagnosis not present

## 2015-11-15 DIAGNOSIS — I10 Essential (primary) hypertension: Secondary | ICD-10-CM | POA: Diagnosis not present

## 2015-11-15 DIAGNOSIS — D509 Iron deficiency anemia, unspecified: Secondary | ICD-10-CM | POA: Diagnosis not present

## 2015-11-22 ENCOUNTER — Telehealth: Payer: Self-pay | Admitting: Hematology and Oncology

## 2015-11-22 NOTE — Telephone Encounter (Signed)
Pt called [facility] reporting blood in urine, call transferred to triage nurse.

## 2015-11-22 NOTE — Telephone Encounter (Signed)
Reports that she has had intermittent color changes in her urine over past several days with it being orange and getting "redder" this morning. Denies burning or pain, states had BM after she initially called and there was no redness with that. Reports that she wears a Pessary. Advised patient to call Dr Clemmie Krill as this is not related to her IDA. She stated "OK"

## 2015-11-23 DIAGNOSIS — Z4689 Encounter for fitting and adjustment of other specified devices: Secondary | ICD-10-CM | POA: Diagnosis not present

## 2015-11-23 DIAGNOSIS — Z6829 Body mass index (BMI) 29.0-29.9, adult: Secondary | ICD-10-CM | POA: Diagnosis not present

## 2015-11-23 DIAGNOSIS — N3001 Acute cystitis with hematuria: Secondary | ICD-10-CM | POA: Diagnosis not present

## 2015-12-03 DIAGNOSIS — K5900 Constipation, unspecified: Secondary | ICD-10-CM | POA: Diagnosis not present

## 2015-12-03 DIAGNOSIS — Z6829 Body mass index (BMI) 29.0-29.9, adult: Secondary | ICD-10-CM | POA: Diagnosis not present

## 2015-12-03 DIAGNOSIS — Z466 Encounter for fitting and adjustment of urinary device: Secondary | ICD-10-CM | POA: Diagnosis not present

## 2016-01-02 ENCOUNTER — Inpatient Hospital Stay: Payer: PPO | Attending: Hematology and Oncology

## 2016-01-02 DIAGNOSIS — G4700 Insomnia, unspecified: Secondary | ICD-10-CM | POA: Diagnosis not present

## 2016-01-02 DIAGNOSIS — Z79899 Other long term (current) drug therapy: Secondary | ICD-10-CM | POA: Insufficient documentation

## 2016-01-02 DIAGNOSIS — Z7982 Long term (current) use of aspirin: Secondary | ICD-10-CM | POA: Insufficient documentation

## 2016-01-02 DIAGNOSIS — D509 Iron deficiency anemia, unspecified: Secondary | ICD-10-CM | POA: Insufficient documentation

## 2016-01-02 DIAGNOSIS — I10 Essential (primary) hypertension: Secondary | ICD-10-CM | POA: Diagnosis not present

## 2016-01-02 DIAGNOSIS — K219 Gastro-esophageal reflux disease without esophagitis: Secondary | ICD-10-CM | POA: Insufficient documentation

## 2016-01-02 DIAGNOSIS — E7800 Pure hypercholesterolemia, unspecified: Secondary | ICD-10-CM | POA: Diagnosis not present

## 2016-01-02 DIAGNOSIS — R634 Abnormal weight loss: Secondary | ICD-10-CM

## 2016-01-02 LAB — CBC WITH DIFFERENTIAL/PLATELET
Basophils Absolute: 0 10*3/uL (ref 0–0.1)
Basophils Relative: 1 %
Eosinophils Absolute: 0.3 10*3/uL (ref 0–0.7)
Eosinophils Relative: 6 %
HCT: 30.5 % — ABNORMAL LOW (ref 35.0–47.0)
Hemoglobin: 9.9 g/dL — ABNORMAL LOW (ref 12.0–16.0)
Lymphocytes Relative: 21 %
Lymphs Abs: 1.2 10*3/uL (ref 1.0–3.6)
MCH: 25.7 pg — ABNORMAL LOW (ref 26.0–34.0)
MCHC: 32.6 g/dL (ref 32.0–36.0)
MCV: 78.9 fL — ABNORMAL LOW (ref 80.0–100.0)
Monocytes Absolute: 0.6 10*3/uL (ref 0.2–0.9)
Monocytes Relative: 11 %
Neutro Abs: 3.5 10*3/uL (ref 1.4–6.5)
Neutrophils Relative %: 61 %
Platelets: 284 10*3/uL (ref 150–440)
RBC: 3.86 MIL/uL (ref 3.80–5.20)
RDW: 21.4 % — ABNORMAL HIGH (ref 11.5–14.5)
WBC: 5.8 10*3/uL (ref 3.6–11.0)

## 2016-01-02 LAB — IRON AND TIBC
Iron: 21 ug/dL — ABNORMAL LOW (ref 28–170)
Saturation Ratios: 5 % — ABNORMAL LOW (ref 10.4–31.8)
TIBC: 426 ug/dL (ref 250–450)
UIBC: 405 ug/dL

## 2016-01-02 LAB — FERRITIN: Ferritin: 7 ng/mL — ABNORMAL LOW (ref 11–307)

## 2016-01-02 LAB — T4, FREE: Free T4: 1.05 ng/dL (ref 0.61–1.12)

## 2016-01-02 LAB — TSH: TSH: 1.674 u[IU]/mL (ref 0.350–4.500)

## 2016-01-09 ENCOUNTER — Other Ambulatory Visit: Payer: Self-pay | Admitting: Hematology and Oncology

## 2016-01-09 ENCOUNTER — Inpatient Hospital Stay: Payer: PPO

## 2016-01-09 ENCOUNTER — Inpatient Hospital Stay (HOSPITAL_BASED_OUTPATIENT_CLINIC_OR_DEPARTMENT_OTHER): Payer: PPO | Admitting: Hematology and Oncology

## 2016-01-09 ENCOUNTER — Encounter: Payer: Self-pay | Admitting: Hematology and Oncology

## 2016-01-09 VITALS — BP 152/79 | HR 52 | Temp 97.9°F | Resp 18 | Wt 167.5 lb

## 2016-01-09 VITALS — BP 148/73 | HR 54 | Resp 20

## 2016-01-09 DIAGNOSIS — Z79899 Other long term (current) drug therapy: Secondary | ICD-10-CM

## 2016-01-09 DIAGNOSIS — D509 Iron deficiency anemia, unspecified: Secondary | ICD-10-CM

## 2016-01-09 MED ORDER — SODIUM CHLORIDE 0.9 % IV SOLN
510.0000 mg | Freq: Once | INTRAVENOUS | Status: AC
  Administered 2016-01-09: 510 mg via INTRAVENOUS
  Filled 2016-01-09: qty 17

## 2016-01-09 MED ORDER — SODIUM CHLORIDE 0.9 % IV SOLN
Freq: Once | INTRAVENOUS | Status: AC
  Administered 2016-01-09: 12:00:00 via INTRAVENOUS
  Filled 2016-01-09: qty 1000

## 2016-01-09 NOTE — Progress Notes (Addendum)
[facility] day:  01/09/2016  Chief Complaint: Alexis Haas is a 82 y.o. female with iron deficiency anemia who is seen for 2 month assessment.  HPI:  The patient was last seen in the hematology clinic on 11/07/2015.  At that time, she was losing weight unexpectantly.  Her energy level had improved post PRBCs and Feraheme.  Hematocrit had improved from 21.1 to 35.3.  Ferritin was 91.  Labs on 01/02/2016 revealed a hematocrit of 30.5, hemoglobin 9.9, MCV 78.9, platelets 284,000, WBC 5800 with an ANC of 3500.  Ferritin was 7 with an iron saturation of 5% and a TIBC of 426.  TSH was 1.674 and free T4 1.05 (normal).  Symptomatically, she stats "I'm still here".  She has an upper respiratory tract infection (URI) with a runny nose.  She denies any fever.    Regarding her diet, she states that I "eat what I want".  She does not eat a lot of meat.  She eats vegetables and greens.  Stool is dark green.   Past Medical History:  Diagnosis Date  . Anemia   . Arthritis   . GERD (gastroesophageal reflux disease)   . Heart disease   . Hypercholesteremia   . Hypertension   . Insomnia     Past Surgical History:  Procedure Laterality Date  . CARDIAC SURGERY  2005  . toe removal    . TONSILLECTOMY      History reviewed. No pertinent family history.  Social History:  reports that she has never smoked. She has never used smokeless tobacco. She reports that she does not drink alcohol or use drugs.  She states that she has worked her whole life (since the age of 60).  She works ar [employer] part time.  She is head of the shoe department.  She lives in [place] at .  She lives alone.  Her husband died 66 years ago.  The patient is alone today.  Allergies:  Allergies  Allergen Reactions  . Amoxicillin Rash    Current Medications: Current Outpatient Prescriptions  Medication Sig Dispense Refill  . aspirin 81 MG tablet Take 81 mg by mouth  daily.    . hydrochlorothiazide (HYDRODIURIL) 25 MG tablet Take 25 mg by mouth daily.    Marland Kitchen lovastatin (MEVACOR) 40 MG tablet Take 40 mg by mouth at bedtime.    . metoprolol tartrate (LOPRESSOR) 25 MG tablet Take 25 mg by mouth 2 (two) times daily.    . naproxen sodium (ANAPROX) 220 MG tablet Take 220 mg by mouth daily as needed.    Marland Kitchen omeprazole (PRILOSEC) 20 MG capsule Take 20 mg by mouth daily.     No current facility-administered medications for this visit.     Review of Systems:  GENERAL:  "I'm still here".  Feels "like usual".  No fevers or sweats.  Weight up 5 pounds pounds. PERFORMANCE STATUS (ECOG):  1 HEENT: URI with runny nose/  No visual changes, sore throat, mouth sores or tenderness. Lungs: No shortness of breath or cough.  No hemoptysis. Cardiac:  No chest pain, palpitations, orthopnea, or PND. GI:  No nausea, vomiting, diarrhea, constipation, melena or hematochezia. GU:  No urgency, frequency, dysuria, or hematuria. Musculoskeletal:  No back pain.  Arthritis in knees.  Can't lift right arm fully up secondary to arthritis.  No muscle tenderness. Extremities:  No pain or swelling. Skin:  No rashes or skin changes. Neuro:  No headache, numbness or  weakness, balance or coordination issues. Endocrine:  No diabetes, thyroid issues, hot flashes or night sweats. Psych:  Poor sleep at night.  No mood changes, depression or anxiety. Pain:  No focal pain. Review of systems:  All other systems reviewed and found to be negative.  Physical Exam: Blood pressure (!) 152/79, pulse (!) 52, temperature 97.9 F (36.6 C), temperature source Tympanic, resp. rate 18, weight 167 lb 8 oz (76 kg). GENERAL:  Elderly woman sitting comfortably in the exam room in no acute distress.  She has a 4 prong cane at her side. MENTAL STATUS:  Alert and oriented to person, place and time. HEAD:  Curly gray hair.  Normocephalic, atraumatic, face symmetric, no Cushingoid features. EYES:  Glasses.  Blue eyes.   Pupils equal round and reactive to light and accomodation.  No conjunctivitis or scleral icterus. ENT:  Oropharynx clear without lesion.  Dentures.  Tongue normal. Mucous membranes moist.  RESPIRATORY:  Clear to auscultation without rales, wheezes or rhonchi. CARDIOVASCULAR:  Regular rate and rhythm without murmur, rub or gallop.  No JVD. ABDOMEN:  Soft, non-tender, with active bowel sounds, and no hepatosplenomegaly.  No masses. SKIN:  No rashes, ulcers or lesions. EXTREMITIES: Arthritic changes in hands.  Left knee brace.  Mild chronic left lower extremity edema below the brace.  No skin discoloration or tenderness.  No palpable cords. LYMPH NODES: No palpable cervical, supraclavicular, axillary or inguinal adenopathy  NEUROLOGICAL: Unremarkable. PSYCH:  Appropriate.   No visits with results within 3 Day(s) from this visit.  Latest known visit with results is:  Appointment on 01/02/2016  Component Date Value Ref Range Status  . WBC 01/02/2016 5.8  3.6 - 11.0 K/uL Final  . RBC 01/02/2016 3.86  3.80 - 5.20 MIL/uL Final  . Hemoglobin 01/02/2016 9.9* 12.0 - 16.0 g/dL Final  . HCT 01/02/2016 30.5* 35.0 - 47.0 % Final  . MCV 01/02/2016 78.9* 80.0 - 100.0 fL Final  . MCH 01/02/2016 25.7* 26.0 - 34.0 pg Final  . MCHC 01/02/2016 32.6  32.0 - 36.0 g/dL Final  . RDW 01/02/2016 21.4* 11.5 - 14.5 % Final  . Platelets 01/02/2016 284  150 - 440 K/uL Final  . Neutrophils Relative % 01/02/2016 61  % Final  . Neutro Abs 01/02/2016 3.5  1.4 - 6.5 K/uL Final  . Lymphocytes Relative 01/02/2016 21  % Final  . Lymphs Abs 01/02/2016 1.2  1.0 - 3.6 K/uL Final  . Monocytes Relative 01/02/2016 11  % Final  . Monocytes Absolute 01/02/2016 0.6  0.2 - 0.9 K/uL Final  . Eosinophils Relative 01/02/2016 6  % Final  . Eosinophils Absolute 01/02/2016 0.3  0 - 0.7 K/uL Final  . Basophils Relative 01/02/2016 1  % Final  . Basophils Absolute 01/02/2016 0.0  0 - 0.1 K/uL Final  . Ferritin 01/02/2016 7* 11 - 307 ng/mL  Final  . Iron 01/02/2016 21* 28 - 170 ug/dL Final  . TIBC 01/02/2016 426  250 - 450 ug/dL Final  . Saturation Ratios 01/02/2016 5* 10.4 - 31.8 % Final  . UIBC 01/02/2016 405  ug/dL Final  . TSH 01/02/2016 1.674  0.350 - 4.500 uIU/mL Final  . Free T4 01/02/2016 1.05  0.61 - 1.12 ng/dL Final   Comment: (NOTE) Biotin ingestion may interfere with free T4 tests. If the results are inconsistent with the TSH level, previous test results, or the clinical presentation, then consider biotin interference. If needed, order repeat testing after stopping biotin.     Assessment:  Alexis Haas is a 82 y.o. female with iron deficiency anemia felt secondary to poor absorption.    Given her age, decision was made for no invasive procedures (EGD or colonoscopy).  Guaiac cards were negative in 12/2014.  Diet is modest.  She notes oral iron is ineffective.  She received 2 units PRBCs on 10/11/2015.  She has received Feraheme on several occasions:  01/11/2015, 02/01/2015, 03/15/2015, 03/22/2015, and 10/16/2015.    Ferritin has been monitored: 5 on 01/02/2015, 36 on 01/31/2105, 26 on 03/08/2015, 184 on 05/02/2015, 7 on 10/10/2015, 91 on 11/07/2015, and 7 on 01/02/2016.  She has received IV iron with improvement in her ferritin and hematocrit, but with subsequent decline after 3 months.  She has no obvious bleeding.  Guaiac cards x 3 were negative (10/2015).  Diet remains modest.  She is losing weight unexpectantly.  Weight has increased since last visit.  Diet is modest.  Symptomatically, she denies any new complaints.  Hematocrit decreased from to 35.3 to 30.5.  Ferritin was 7 on 01/02/2016.  Plan: 1.  Review las from 01/02/2016.  Discuss recurrent iron deficiency.  Discuss concern for blood loss.  Discuss investigation of underlying etiology.  Patient states that she will "think on it". 2.  Feraheme today. 3.  Urinalysis- r/o hematuria. 4.  Guaiac cards x 3 5.  RTC on 02/27/2016 for labs (CBC with diff,  ferritin, iron studies) 6.  RTC on 03/05/2016 for MD assessment, review of labs, and +/- Feraheme   Lequita Asal, MD  01/09/2016, 11:37 AM

## 2016-01-23 ENCOUNTER — Other Ambulatory Visit: Payer: PPO

## 2016-01-30 ENCOUNTER — Other Ambulatory Visit: Payer: Self-pay | Admitting: *Deleted

## 2016-01-30 DIAGNOSIS — D509 Iron deficiency anemia, unspecified: Secondary | ICD-10-CM

## 2016-01-30 LAB — URINALYSIS, COMPLETE (UACMP) WITH MICROSCOPIC
Bilirubin Urine: NEGATIVE
Glucose, UA: NEGATIVE mg/dL
Ketones, ur: NEGATIVE mg/dL
Nitrite: POSITIVE — AB
Protein, ur: 30 mg/dL — AB
Specific Gravity, Urine: 1.015 (ref 1.005–1.030)
pH: 6.5 (ref 5.0–8.0)

## 2016-01-30 LAB — OCCULT BLOOD X 1 CARD TO LAB, STOOL
Fecal Occult Bld: POSITIVE — AB
Fecal Occult Bld: POSITIVE — AB
Fecal Occult Bld: POSITIVE — AB

## 2016-01-31 ENCOUNTER — Telehealth: Payer: Self-pay | Admitting: *Deleted

## 2016-01-31 MED ORDER — SULFAMETHOXAZOLE-TRIMETHOPRIM 800-160 MG PO TABS: 1.0000 | ORAL_TABLET | Freq: Two times a day (BID) | ORAL | 0 refills | Status: DC

## 2016-01-31 NOTE — Telephone Encounter (Signed)
Daughter Opal Sidles called with information r/t UTI and medication ordered by Dr. Grayland Ormond voiced understanding, patient and Daughter to discuss testing and further things to do r/t low iron and positive guaic test in January on folllow up visit.

## 2016-02-11 DIAGNOSIS — W19XXXA Unspecified fall, initial encounter: Secondary | ICD-10-CM

## 2016-02-11 HISTORY — DX: Unspecified fall, initial encounter: W19.XXXA

## 2016-02-27 ENCOUNTER — Other Ambulatory Visit: Payer: PPO

## 2016-03-03 ENCOUNTER — Inpatient Hospital Stay: Payer: PPO | Attending: Hematology and Oncology

## 2016-03-03 DIAGNOSIS — D509 Iron deficiency anemia, unspecified: Secondary | ICD-10-CM | POA: Insufficient documentation

## 2016-03-03 DIAGNOSIS — E7800 Pure hypercholesterolemia, unspecified: Secondary | ICD-10-CM | POA: Insufficient documentation

## 2016-03-03 DIAGNOSIS — G4700 Insomnia, unspecified: Secondary | ICD-10-CM | POA: Diagnosis not present

## 2016-03-03 DIAGNOSIS — I10 Essential (primary) hypertension: Secondary | ICD-10-CM | POA: Insufficient documentation

## 2016-03-03 DIAGNOSIS — K219 Gastro-esophageal reflux disease without esophagitis: Secondary | ICD-10-CM | POA: Insufficient documentation

## 2016-03-03 DIAGNOSIS — Z79899 Other long term (current) drug therapy: Secondary | ICD-10-CM | POA: Diagnosis not present

## 2016-03-03 DIAGNOSIS — Z7982 Long term (current) use of aspirin: Secondary | ICD-10-CM | POA: Diagnosis not present

## 2016-03-03 LAB — CBC WITH DIFFERENTIAL/PLATELET
Basophils Absolute: 0 10*3/uL (ref 0–0.1)
Basophils Relative: 0 %
Eosinophils Absolute: 0.4 10*3/uL (ref 0–0.7)
Eosinophils Relative: 6 %
HCT: 29.9 % — ABNORMAL LOW (ref 35.0–47.0)
Hemoglobin: 9.5 g/dL — ABNORMAL LOW (ref 12.0–16.0)
Lymphocytes Relative: 19 %
Lymphs Abs: 1.2 10*3/uL (ref 1.0–3.6)
MCH: 26.3 pg (ref 26.0–34.0)
MCHC: 32 g/dL (ref 32.0–36.0)
MCV: 82.3 fL (ref 80.0–100.0)
Monocytes Absolute: 0.6 10*3/uL (ref 0.2–0.9)
Monocytes Relative: 10 %
Neutro Abs: 4 10*3/uL (ref 1.4–6.5)
Neutrophils Relative %: 65 %
Platelets: 314 10*3/uL (ref 150–440)
RBC: 3.63 MIL/uL — ABNORMAL LOW (ref 3.80–5.20)
RDW: 17.3 % — ABNORMAL HIGH (ref 11.5–14.5)
WBC: 6.2 10*3/uL (ref 3.6–11.0)

## 2016-03-03 LAB — URINALYSIS, COMPLETE (UACMP) WITH MICROSCOPIC
Bilirubin Urine: NEGATIVE
Glucose, UA: NEGATIVE mg/dL
Ketones, ur: NEGATIVE mg/dL
Nitrite: NEGATIVE
Protein, ur: NEGATIVE mg/dL
Specific Gravity, Urine: 1.015 (ref 1.005–1.030)
pH: 7.5 (ref 5.0–8.0)

## 2016-03-04 LAB — FERRITIN: Ferritin: 14 ng/mL (ref 11–307)

## 2016-03-05 ENCOUNTER — Inpatient Hospital Stay (HOSPITAL_BASED_OUTPATIENT_CLINIC_OR_DEPARTMENT_OTHER): Payer: PPO | Admitting: Hematology and Oncology

## 2016-03-05 ENCOUNTER — Encounter: Payer: Self-pay | Admitting: Hematology and Oncology

## 2016-03-05 ENCOUNTER — Inpatient Hospital Stay: Payer: PPO

## 2016-03-05 VITALS — BP 148/56 | HR 52 | Temp 95.7°F | Resp 18 | Wt 159.0 lb

## 2016-03-05 VITALS — BP 170/67 | HR 54 | Temp 96.1°F | Resp 18

## 2016-03-05 DIAGNOSIS — D509 Iron deficiency anemia, unspecified: Secondary | ICD-10-CM

## 2016-03-05 DIAGNOSIS — Z79899 Other long term (current) drug therapy: Secondary | ICD-10-CM | POA: Diagnosis not present

## 2016-03-05 DIAGNOSIS — R634 Abnormal weight loss: Secondary | ICD-10-CM

## 2016-03-05 DIAGNOSIS — D508 Other iron deficiency anemias: Secondary | ICD-10-CM

## 2016-03-05 DIAGNOSIS — R195 Other fecal abnormalities: Secondary | ICD-10-CM

## 2016-03-05 MED ORDER — FERUMOXYTOL INJECTION 510 MG/17 ML
510.0000 mg | Freq: Once | INTRAVENOUS | Status: AC
  Administered 2016-03-05: 510 mg via INTRAVENOUS

## 2016-03-05 MED ORDER — SODIUM CHLORIDE 0.9 % IV SOLN
Freq: Once | INTRAVENOUS | Status: AC
  Administered 2016-03-05: 15:00:00 via INTRAVENOUS
  Filled 2016-03-05: qty 1000

## 2016-03-05 MED ORDER — SODIUM CHLORIDE 0.9 % IV SOLN: 510.0000 mg | Freq: Once | INTRAVENOUS | Status: DC

## 2016-03-05 NOTE — Progress Notes (Signed)
[facility] day:  03/05/2016  Chief Complaint: Alexis Haas is a 83 y.o. female with iron deficiency anemia who is seen for 2 month assessment.  HPI:  The patient was last seen in the hematology clinic on 01/09/2016.  At that time, she denied any new complaints.  Hematocrit had decreased from to 35.3 to 30.5.  Ferritin was 7 on 01/02/2016.  She received Feraheme.  She was thinking about pursuing an investigation of her blood loss.  Guaiac cards x 3 were positive in 01/2016.  Urinalysis on 01/30/2016 revealed + nitrites, too numerous leukocytes, many bacteria, small amount of Hgb, and 0-5 RBCs/hpf.  Septra was prescribed.  CBC on 03/03/2016 revealed a hematocrit of 29.9, hemoglobin 9.5, MCV 82.3, platelets 314,000, WBC 6200 with an ANC of 4000.  Ferritin was 14.  Iron studies revealed an iron saturation of 5% and a TIBC of 393.  During the interim, she has felt the same.  She denies any melena or hematochezia.  She is eating well, but continues to lose weight.   Past Medical History:  Diagnosis Date  . Anemia   . Arthritis   . GERD (gastroesophageal reflux disease)   . Heart disease   . Hypercholesteremia   . Hypertension   . Insomnia     Past Surgical History:  Procedure Laterality Date  . CARDIAC SURGERY  2005  . toe removal    . TONSILLECTOMY      History reviewed. No pertinent family history.  Social History:  reports that she has never smoked. She has never used smokeless tobacco. She reports that she does not drink alcohol or use drugs.  She states that she has worked her whole life (since the age of 66).  She works ar [employer] part time.  She is head of the shoe department.  She lives in [place] at [place].  She lives alone.  Her husband died 64 years ago.  The patient is alone today.  Allergies:  Allergies  Allergen Reactions  . Amoxicillin Rash    Current Medications: Current Outpatient Prescriptions  Medication Sig  Dispense Refill  . aspirin 81 MG tablet Take 81 mg by mouth daily.    . hydrochlorothiazide (HYDRODIURIL) 25 MG tablet Take 25 mg by mouth daily.    Marland Kitchen lovastatin (MEVACOR) 40 MG tablet Take 40 mg by mouth at bedtime.    . metoprolol tartrate (LOPRESSOR) 25 MG tablet Take 25 mg by mouth 2 (two) times daily.    . naproxen sodium (ANAPROX) 220 MG tablet Take 220 mg by mouth daily as needed.    Marland Kitchen omeprazole (PRILOSEC) 20 MG capsule Take 20 mg by mouth daily.    Marland Kitchen sulfamethoxazole-trimethoprim (BACTRIM DS,SEPTRA DS) 800-160 MG tablet Take 1 tablet by mouth 2 (two) times daily. (Patient not taking: Reported on 03/05/2016) 10 tablet 0   No current facility-administered medications for this visit.     Review of Systems:  GENERAL:  Feels "like usual".  No fevers or sweats.  Weight down 8 pounds. PERFORMANCE STATUS (ECOG):  1 HEENT:  No visual changes, sore throat, mouth sores or tenderness. Lungs: No shortness of breath or cough.  No hemoptysis. Cardiac:  No chest pain, palpitations, orthopnea, or PND. GI:  No nausea, vomiting, diarrhea, constipation, melena or hematochezia. GU:  No urgency, frequency, dysuria, or hematuria. Musculoskeletal:  No back pain.  Arthritis in left knee (wears brace).  Can't lift right arm fully up secondary to arthritis.  No muscle tenderness. Extremities:  No pain or swelling. Skin:  No rashes or skin changes. Neuro:  No headache, numbness or weakness, balance or coordination issues. Endocrine:  No diabetes, thyroid issues, hot flashes or night sweats. Psych:  Poor sleep.  No mood changes, depression or anxiety. Pain:  No focal pain. Review of systems:  All other systems reviewed and found to be negative.  Physical Exam: Blood pressure (!) 148/56, pulse (!) 52, temperature (!) 95.7 F (35.4 C), temperature source Tympanic, resp. rate 18, weight 159 lb (72.1 kg). GENERAL:  Elderly woman sitting comfortably in the exam room in no acute distress.  She has a rolling  walker at her side. MENTAL STATUS:  Alert and oriented to person, place and time. HEAD:  Curly gray hair.  Normocephalic, atraumatic, face symmetric, no Cushingoid features. EYES:  Glasses.  Blue eyes.  Pupils equal round and reactive to light and accomodation.  No conjunctivitis or scleral icterus. ENT:  Oropharynx clear without lesion.  Dentures.  Tongue normal. Mucous membranes moist.  RESPIRATORY:  Clear to auscultation without rales, wheezes or rhonchi. CARDIOVASCULAR:  Regular rate and rhythm without murmur, rub or gallop.  No JVD. ABDOMEN:  Soft, non-tender, with active bowel sounds, and no hepatosplenomegaly.  No masses. SKIN:  No rashes, ulcers or lesions. EXTREMITIES: Arthritic changes in hands.  Left knee brace.  Chronic lower extremity edema (left > right).  No skin discoloration or tenderness.  No palpable cords. LYMPH NODES: No palpable cervical, supraclavicular, axillary or inguinal adenopathy  NEUROLOGICAL: Unremarkable. PSYCH:  Appropriate.   Appointment on 03/03/2016  Component Date Value Ref Range Status  . WBC 03/03/2016 6.2  3.6 - 11.0 K/uL Final  . RBC 03/03/2016 3.63* 3.80 - 5.20 MIL/uL Final  . Hemoglobin 03/03/2016 9.5* 12.0 - 16.0 g/dL Final  . HCT 03/03/2016 29.9* 35.0 - 47.0 % Final  . MCV 03/03/2016 82.3  80.0 - 100.0 fL Final  . MCH 03/03/2016 26.3  26.0 - 34.0 pg Final  . MCHC 03/03/2016 32.0  32.0 - 36.0 g/dL Final  . RDW 03/03/2016 17.3* 11.5 - 14.5 % Final  . Platelets 03/03/2016 314  150 - 440 K/uL Final  . Neutrophils Relative % 03/03/2016 65  % Final  . Neutro Abs 03/03/2016 4.0  1.4 - 6.5 K/uL Final  . Lymphocytes Relative 03/03/2016 19  % Final  . Lymphs Abs 03/03/2016 1.2  1.0 - 3.6 K/uL Final  . Monocytes Relative 03/03/2016 10  % Final  . Monocytes Absolute 03/03/2016 0.6  0.2 - 0.9 K/uL Final  . Eosinophils Relative 03/03/2016 6  % Final  . Eosinophils Absolute 03/03/2016 0.4  0 - 0.7 K/uL Final  . Basophils Relative 03/03/2016 0  % Final   . Basophils Absolute 03/03/2016 0.0  0 - 0.1 K/uL Final  . Iron 03/03/2016 61  28 - 170 ug/dL Final  . TIBC 03/03/2016 393  250 - 450 ug/dL Final  . Saturation Ratios 03/03/2016 5* 10.4 - 31.8 % Final  . UIBC 03/03/2016 373  ug/dL Final  . Ferritin 03/03/2016 14  11 - 307 ng/mL Final  . Color, Urine 03/03/2016 YELLOW  YELLOW Final  . APPearance 03/03/2016 CLOUDY* CLEAR Final  . Specific Gravity, Urine 03/03/2016 1.015  1.005 - 1.030 Final  . pH 03/03/2016 7.5  5.0 - 8.0 Final  . Glucose, UA 03/03/2016 NEGATIVE  NEGATIVE mg/dL Final  . Hgb urine dipstick 03/03/2016 TRACE* NEGATIVE Final  . Bilirubin Urine 03/03/2016 NEGATIVE  NEGATIVE Final  .  Ketones, ur 03/03/2016 NEGATIVE  NEGATIVE mg/dL Final  . Protein, ur 03/03/2016 NEGATIVE  NEGATIVE mg/dL Final  . Nitrite 03/03/2016 NEGATIVE  NEGATIVE Final  . Leukocytes, UA 03/03/2016 SMALL* NEGATIVE Final  . Squamous Epithelial / LPF 03/03/2016 0-5* NONE SEEN Final  . WBC, UA 03/03/2016 21-50  0 - 5 WBC/hpf Final  . RBC / HPF 03/03/2016 0-5  0 - 5 RBC/hpf Final  . Bacteria, UA 03/03/2016 MANY* NONE SEEN Final    Assessment:  SHAZIA WEHRLY is a 83 y.o. female with iron deficiency anemia felt secondary to poor absorption.    Given her age, decision was made for no invasive procedures (EGD or colonoscopy).  Guaiac cards were negative in 12/2014.  Diet is modest.  She notes oral iron is ineffective.  She received 2 units PRBCs on 10/11/2015 for a hematocrit of 21.1 and hemoglobin 6.4.  She has received Feraheme on several occasions:  01/11/2015, 02/01/2015, 03/15/2015, 03/22/2015, 10/16/2015, and 01/09/2016.    Ferritin has been monitored: 5 on 01/02/2015, 36 on 01/31/2105, 26 on 03/08/2015, 184 on 05/02/2015, 7 on 10/10/2015, 91 on 11/07/2015, 7 on 01/02/2016, and 14 on 03/03/2016.  She has received IV iron with improvement in her ferritin and hematocrit, but with subsequent decline after 3 months.  She denies any bleeding.  Guaiac cards x 3  were negative (10/2015) then positive (01/2016).  Diet has improved.  Symptomatically, she denies any complaints.  Hematocrit has decreased from to 30.5 to 29.9 despite IV iron.  Ferritin has not significantly improved (7 on 01/02/2016 and 14 on 03/03/2016) despite IV iron.  Guaiac cards are positive.  She is losing weight.  Plan: 1.  Review labs from 03/03/2016.  Discuss persistent iron deficiency despite multiple iron infusions and concern for GI blood loss.  Guaiac cards x 3 positive.  Discuss investigation of underlying etiology.  Patient initially stated that she would "think on it" and wanted to "continue what we are doing".  After ongoing discussion, patient agreeable to meet with gastroenterology to discuss consideration of endoscopy.  She requests that her daughter be there for the appointment.  Coordinate with patient's daughter Theodora Blow; home: [phone]; work: [phone]). 2.  Feraheme today. 3.  GI consult. 4.  RTC in 1 month for MD assessment, labs (CBC with diff, ferritin, iron studies- day before) and +/- Feraheme.   Lequita Asal, MD  03/05/2016, 2:46 PM

## 2016-03-05 NOTE — Patient Instructions (Signed)
Ferumoxytol injection What is this medicine? FERUMOXYTOL is an iron complex. Iron is used to make healthy red blood cells, which carry oxygen and nutrients throughout the body. This medicine is used to treat iron deficiency anemia in people with chronic kidney disease. COMMON BRAND NAME(S): Feraheme What should I tell my health care provider before I take this medicine? They need to know if you have any of these conditions: -anemia not caused by low iron levels -high levels of iron in the blood -magnetic resonance imaging (MRI) test scheduled -an unusual or allergic reaction to iron, other medicines, foods, dyes, or preservatives -pregnant or trying to get pregnant -breast-feeding How should I use this medicine? This medicine is for injection into a vein. It is given by a health care professional in a hospital or clinic setting. Talk to your pediatrician regarding the use of this medicine in children. Special care may be needed. What if I miss a dose? It is important not to miss your dose. Call your doctor or health care professional if you are unable to keep an appointment. What may interact with this medicine? This medicine may interact with the following medications: -other iron products What should I watch for while using this medicine? Visit your doctor or healthcare professional regularly. Tell your doctor or healthcare professional if your symptoms do not start to get better or if they get worse. You may need blood work done while you are taking this medicine. You may need to follow a special diet. Talk to your doctor. Foods that contain iron include: whole grains/cereals, dried fruits, beans, or peas, leafy green vegetables, and organ meats (liver, kidney). What side effects may I notice from receiving this medicine? Side effects that you should report to your doctor or health care professional as soon as possible: -allergic reactions like skin rash, itching or hives, swelling of the  face, lips, or tongue -breathing problems -changes in blood pressure -feeling faint or lightheaded, falls -fever or chills -flushing, sweating, or hot feelings -swelling of the ankles or feet Side effects that usually do not require medical attention (report to your doctor or health care professional if they continue or are bothersome): -diarrhea -headache -nausea, vomiting -stomach pain Where should I keep my medicine? This drug is given in a hospital or clinic and will not be stored at home.  2017 Elsevier/Gold Standard (2015-03-01 12:41:49)  

## 2016-03-17 ENCOUNTER — Telehealth: Payer: Self-pay | Admitting: *Deleted

## 2016-03-17 LAB — IRON AND TIBC
Iron: 20 ug/dL — ABNORMAL LOW (ref 28–170)
Saturation Ratios: 5 % — ABNORMAL LOW (ref 10.4–31.8)
TIBC: 393 ug/dL (ref 250–450)
UIBC: 376 ug/dL

## 2016-03-17 NOTE — Telephone Encounter (Signed)
Demetria called from main lab to inform us that patient had labs about 2 weeks ago and iron results were keyed in @ 61 when they were in fact 20. She is writing incident report and sending corrected report.  MD notified.

## 2016-03-21 DIAGNOSIS — S0180XA Unspecified open wound of other part of head, initial encounter: Secondary | ICD-10-CM | POA: Diagnosis not present

## 2016-03-26 ENCOUNTER — Encounter: Payer: Self-pay | Admitting: Gastroenterology

## 2016-03-26 ENCOUNTER — Ambulatory Visit (INDEPENDENT_AMBULATORY_CARE_PROVIDER_SITE_OTHER): Payer: PPO | Admitting: Gastroenterology

## 2016-03-26 VITALS — BP 179/80 | HR 79 | Temp 97.6°F | Ht 64.0 in | Wt 169.0 lb

## 2016-03-26 DIAGNOSIS — D500 Iron deficiency anemia secondary to blood loss (chronic): Secondary | ICD-10-CM | POA: Diagnosis not present

## 2016-03-26 DIAGNOSIS — K921 Melena: Secondary | ICD-10-CM | POA: Diagnosis not present

## 2016-03-26 NOTE — Progress Notes (Signed)
Gastroenterology Consultation  Referring Provider:     Lynnell Jude, MD Primary Care Physician:  Lynnell Jude, MD Primary Gastroenterologist:  Dr. Allen Norris     Reason for Consultation:     Anemia with heme positive stools        HPI:   Alexis Haas is a 83 y.o. y/o female referred for consultation & management of Anemia with heme positive stools by Dr. Clemmie Krill, Lynnell Jude, MD.  This patient comes in today with a low iron with anemia and heme positive stools.  The patient is 83 years old but appears much younger than her stated age.  The patient has recently had a trauma to the face due to a door hitting her lipase and she has ecchymosis all over the face.  The patient reports that she has not seen any black stools or bloody stools.  She also denies any unexplained weight loss fevers chills nausea or vomiting.  The patient comes in with her son today who reports that the patient takes care of herself with very Little outside intervention.  The patient also reports that she is not on a lot of medications but has had a history of open heart surgery and is on metoprolol.  The patient also takes something for cholesterol and hydrochlorothiazide.  Past Medical History:  Diagnosis Date  . Anemia   . Arthritis   . GERD (gastroesophageal reflux disease)   . Heart disease   . Hypercholesteremia   . Hypertension   . Insomnia     Past Surgical History:  Procedure Laterality Date  . CARDIAC SURGERY  2005  . toe removal    . TONSILLECTOMY      Prior to Admission medications   Medication Sig Start Date End Date Taking? Authorizing Provider  aspirin 81 MG tablet Take 81 mg by mouth daily.   Yes Historical Provider, MD  hydrochlorothiazide (HYDRODIURIL) 25 MG tablet Take 25 mg by mouth daily.   Yes Historical Provider, MD  lovastatin (MEVACOR) 40 MG tablet Take 40 mg by mouth at bedtime.   Yes Historical Provider, MD  metoprolol tartrate (LOPRESSOR) 25 MG tablet Take 25 mg by mouth 2 (two) times  daily.   Yes Historical Provider, MD  naproxen sodium (ANAPROX) 220 MG tablet Take 220 mg by mouth daily as needed.   Yes Historical Provider, MD  omeprazole (PRILOSEC) 20 MG capsule Take 20 mg by mouth daily.   Yes Historical Provider, MD  sulfamethoxazole-trimethoprim (BACTRIM DS,SEPTRA DS) 800-160 MG tablet Take 1 tablet by mouth 2 (two) times daily. Patient not taking: Reported on 03/05/2016 01/31/16   Alexis Huger, MD    History reviewed. No pertinent family history.   Social History  Substance Use Topics  . Smoking status: Never Smoker  . Smokeless tobacco: Never Used  . Alcohol use No    Allergies as of 03/26/2016 - Review Complete 03/26/2016  Allergen Reaction Noted  . Amoxicillin Rash 01/02/2015    Review of Systems:    All systems reviewed and negative except where noted in HPI.   Physical Exam:  BP (!) 179/80   Pulse 79   Temp 97.6 F (36.4 C) (Oral)   Ht 5\' 4"  (1.626 m)   Wt 169 lb (76.7 kg)   BMI 29.01 kg/m  No LMP recorded. Psych:  Alert and cooperative. Normal mood and affect. General:   Alert,  Well-developed, well-nourished, pleasant and cooperative in NAD Head:  Normocephalic and Positive traumatic with a  recent Trauma to the face. Eyes:  Sclera clear, no icterus.   Conjunctiva pink. Ears:  Normal auditory acuity. Nose:  No deformity, discharge, or lesions. Mouth:  No deformity or lesions,oropharynx pink & moist. Neck:  Supple; no masses or thyromegaly. Lungs:  Respirations even and unlabored.  Clear throughout to auscultation.   No wheezes, crackles, or rhonchi. No acute distress. Heart:  Regular rate and rhythm; no murmurs, clicks, rubs, or gallops. Abdomen:  Normal bowel sounds.  No bruits.  Soft, non-tender and non-distended without masses, hepatosplenomegaly or hernias noted.  No guarding or rebound tenderness.  Negative Carnett sign.   Rectal:  Deferred.  Msk:  Symmetrical without gross deformities.  Good, equal movement & strength  bilaterally. Pulses:  Normal pulses noted. Extremities:  No clubbing or edema.  No cyanosis. Neurologic:  Alert and oriented x3;  grossly normal neurologically. Skin: Stitches on her head with ecchymosis around the eyes and nose.  No jaundice. Lymph Nodes:  No significant cervical adenopathy. Psych:  Alert and cooperative. Normal mood and affect.  Imaging Studies: No results found.  Assessment and Plan:   Alexis Haas is a 83 y.o. y/o female who appears younger than her stated age.  The patient was found to have heme positive stools and iron deficiency anemia.  She was sent to me for evaluation of the anemia.  The patient and her son would like to talk to her other children to discuss whether a GI workup would be warranted in this 83 year old woman.  She has been told that the risks of the procedures are increased because of her age but we could discuss whether or not proceeding with the workup will be done after she discusses it with her children.    Lucilla Lame, MD. Alexis Haas   Note: This dictation was prepared with Dragon dictation along with smaller phrase technology. Any transcriptional errors that result from this process are unintentional.

## 2016-03-27 ENCOUNTER — Other Ambulatory Visit: Payer: Self-pay

## 2016-03-27 ENCOUNTER — Ambulatory Visit: Payer: Self-pay | Admitting: Gastroenterology

## 2016-03-28 DIAGNOSIS — R269 Unspecified abnormalities of gait and mobility: Secondary | ICD-10-CM | POA: Diagnosis not present

## 2016-03-28 DIAGNOSIS — S0180XD Unspecified open wound of other part of head, subsequent encounter: Secondary | ICD-10-CM | POA: Diagnosis not present

## 2016-03-28 DIAGNOSIS — D509 Iron deficiency anemia, unspecified: Secondary | ICD-10-CM | POA: Diagnosis not present

## 2016-03-28 DIAGNOSIS — M6281 Muscle weakness (generalized): Secondary | ICD-10-CM | POA: Diagnosis not present

## 2016-03-28 DIAGNOSIS — Z6830 Body mass index (BMI) 30.0-30.9, adult: Secondary | ICD-10-CM | POA: Diagnosis not present

## 2016-04-02 ENCOUNTER — Other Ambulatory Visit: Payer: Self-pay

## 2016-04-07 ENCOUNTER — Inpatient Hospital Stay: Payer: PPO | Attending: Hematology and Oncology

## 2016-04-07 DIAGNOSIS — G4700 Insomnia, unspecified: Secondary | ICD-10-CM | POA: Insufficient documentation

## 2016-04-07 DIAGNOSIS — D509 Iron deficiency anemia, unspecified: Secondary | ICD-10-CM | POA: Insufficient documentation

## 2016-04-07 DIAGNOSIS — E7800 Pure hypercholesterolemia, unspecified: Secondary | ICD-10-CM | POA: Diagnosis not present

## 2016-04-07 DIAGNOSIS — Z7982 Long term (current) use of aspirin: Secondary | ICD-10-CM | POA: Diagnosis not present

## 2016-04-07 DIAGNOSIS — I10 Essential (primary) hypertension: Secondary | ICD-10-CM | POA: Insufficient documentation

## 2016-04-07 DIAGNOSIS — K219 Gastro-esophageal reflux disease without esophagitis: Secondary | ICD-10-CM | POA: Diagnosis not present

## 2016-04-07 LAB — CBC WITH DIFFERENTIAL/PLATELET
Basophils Absolute: 0.1 10*3/uL (ref 0–0.1)
Basophils Relative: 1 %
Eosinophils Absolute: 0.3 10*3/uL (ref 0–0.7)
Eosinophils Relative: 5 %
HCT: 34.3 % — ABNORMAL LOW (ref 35.0–47.0)
Hemoglobin: 11.2 g/dL — ABNORMAL LOW (ref 12.0–16.0)
Lymphocytes Relative: 17 %
Lymphs Abs: 1 10*3/uL (ref 1.0–3.6)
MCH: 27 pg (ref 26.0–34.0)
MCHC: 32.7 g/dL (ref 32.0–36.0)
MCV: 82.6 fL (ref 80.0–100.0)
Monocytes Absolute: 0.5 10*3/uL (ref 0.2–0.9)
Monocytes Relative: 9 %
Neutro Abs: 4 10*3/uL (ref 1.4–6.5)
Neutrophils Relative %: 68 %
Platelets: 263 10*3/uL (ref 150–440)
RBC: 4.16 MIL/uL (ref 3.80–5.20)
RDW: 19 % — ABNORMAL HIGH (ref 11.5–14.5)
WBC: 5.8 10*3/uL (ref 3.6–11.0)

## 2016-04-07 LAB — IRON AND TIBC
Iron: 42 ug/dL (ref 28–170)
Saturation Ratios: 12 % (ref 10.4–31.8)
TIBC: 342 ug/dL (ref 250–450)
UIBC: 300 ug/dL

## 2016-04-07 LAB — FERRITIN: Ferritin: 33 ng/mL (ref 11–307)

## 2016-04-09 ENCOUNTER — Inpatient Hospital Stay: Payer: PPO

## 2016-04-09 ENCOUNTER — Encounter: Payer: Self-pay | Admitting: Hematology and Oncology

## 2016-04-09 ENCOUNTER — Inpatient Hospital Stay (HOSPITAL_BASED_OUTPATIENT_CLINIC_OR_DEPARTMENT_OTHER): Payer: PPO | Admitting: Hematology and Oncology

## 2016-04-09 VITALS — BP 169/50 | HR 58 | Temp 97.2°F | Resp 18 | Wt 170.6 lb

## 2016-04-09 DIAGNOSIS — D509 Iron deficiency anemia, unspecified: Secondary | ICD-10-CM | POA: Diagnosis not present

## 2016-04-09 NOTE — Progress Notes (Signed)
[facility] day:  04/09/2016  Chief Complaint: Alexis Haas is a 83 y.o. female with iron deficiency anemia who is seen for 1 month assessment.  HPI:  The patient was last seen in the hematology clinic on 03/05/2016.  At that time, she denied any complaints.  Hematocrit had decreased from to 30.5 to 29.9 despite IV iron.  Ferritin had not significantly improved (7 on 01/02/2016 and 14 on 03/03/2016) despite IV iron.  Guaiac cards were positive.  She was losing weight.  She received Feraheme.  She was encouraged to meet with gastroenterology.  She met with Dr Lucilla Lame on 03/26/2016.  She came with her son.  She decided to talk with her other children before proceeding to a work-up.  She has decided to proceed with endoscopy next week (04/15/2016).  She ran into the door at work and hit her head 3 weeks ago.  She was brought to the hospital by her son.  She developed a hematoma and facial bruising.  She did not lose consciousness.  She required 7 sutures.  Bruising is clearing up.  She has not been back to work.  Labs on 04/07/2016 revealed a hematocrit of 34.1, hemoglobin 11.2, and MCV 82.6.  Ferritin was 33.  Iron studies included a saturation of 12% and a TIBC of 342.  Symptomatically, she denies any melena or hematochezia.  She is eating well.   Past Medical History:  Diagnosis Date  . Anemia   . Arthritis   . GERD (gastroesophageal reflux disease)   . Heart disease   . Hypercholesteremia   . Hypertension   . Insomnia     Past Surgical History:  Procedure Laterality Date  . CARDIAC SURGERY  2005  . toe removal    . TONSILLECTOMY      History reviewed. No pertinent family history.  Social History:  reports that she has never smoked. She has never used smokeless tobacco. She reports that she does not drink alcohol or use drugs.  She states that she has worked her whole life (since the age of 48).  She works ar [employer] part time.   She is head of the shoe department.  She lives in [place].  She lives alone.  Her husband died 79 years ago.  Her daughter is Theodora Blow (home: [phone]; work: [phone]).  The patient is alone today.  Allergies:  Allergies  Allergen Reactions  . Amoxicillin Rash    Current Medications: Current Outpatient Prescriptions  Medication Sig Dispense Refill  . aspirin 81 MG tablet Take 81 mg by mouth daily.    . hydrochlorothiazide (HYDRODIURIL) 25 MG tablet Take 25 mg by mouth daily.    Marland Kitchen lovastatin (MEVACOR) 40 MG tablet Take 40 mg by mouth at bedtime.    . metoprolol tartrate (LOPRESSOR) 25 MG tablet Take 25 mg by mouth 2 (two) times daily.    . naproxen sodium (ANAPROX) 220 MG tablet Take 220 mg by mouth daily as needed.    Marland Kitchen omeprazole (PRILOSEC) 20 MG capsule Take 20 mg by mouth daily.    Marland Kitchen sulfamethoxazole-trimethoprim (BACTRIM DS,SEPTRA DS) 800-160 MG tablet Take 1 tablet by mouth 2 (two) times daily. (Patient not taking: Reported on 03/05/2016) 10 tablet 0   No current facility-administered medications for this visit.     Review of Systems:  GENERAL:  Feels "okl".  No fevers or sweats.  Weight up 11 pounds. PERFORMANCE STATUS (ECOG):  1  HEENT:  No visual changes, sore throat, mouth sores or tenderness. Lungs: No shortness of breath or cough.  No hemoptysis. Cardiac:  No chest pain, palpitations, orthopnea, or PND. GI:  No nausea, vomiting, diarrhea, constipation, melena or hematochezia. GU:  No urgency, frequency, dysuria, or hematuria. Musculoskeletal:  No back pain.  Arthritis in left knee (wears brace).  Can't lift right arm fully up secondary to arthritis.  No muscle tenderness. Extremities:  No pain or swelling. Skin:  Significant bruising s/p trauma (see HPI).  No rashes or skin changes. Neuro:  More forgetful.  No headache, numbness or weakness, balance or coordination issues. Endocrine:  No diabetes, thyroid issues, hot flashes or night  sweats. Psych:  No mood changes, depression or anxiety. Pain:  No focal pain. Review of systems:  All other systems reviewed and found to be negative.  Physical Exam: Blood pressure (!) 155/62, pulse (!) 56, temperature 97.2 F (36.2 C), temperature source Tympanic, resp. rate 18, weight 170 lb 10.2 oz (77.4 kg). GENERAL:  Elderly woman sitting comfortably in the exam room in no acute distress.  She has a rolling walker at her side. MENTAL STATUS:  Alert and oriented to person, place and time. HEAD:  Curly gray hair.  2-2.5 cm hematoma right forehead.  Left cheek with ecchymosis under her glasses.  Normocephalic, face symmetric, no Cushingoid features. EYES:  Glasses.  Blue eyes.  Pupils equal round and reactive to light and accomodation.  No conjunctivitis or scleral icterus. ENT:  Oropharynx clear without lesion.  Dentures.  Tongue normal. Mucous membranes moist.  RESPIRATORY:  Clear to auscultation without rales, wheezes or rhonchi. CARDIOVASCULAR:  Regular rate and rhythm without murmur, rub or gallop.  No JVD. ABDOMEN:  Soft, non-tender, with active bowel sounds, and no hepatosplenomegaly.  No masses. SKIN:  2-2.5 cm hematoma right forehead.  Left cheek with ecchymosis under her glasses.  Fading ecchymosis under chin on right side.  No rashes, ulcers or lesions. EXTREMITIES: Arthritic changes in hands.  Left knee brace.  Chronic 1+ bilateral ankle edema (left > right).  No skin discoloration or tenderness.  No palpable cords. LYMPH NODES: No palpable cervical, supraclavicular, axillary or inguinal adenopathy  NEUROLOGICAL: Unremarkable. PSYCH:  Appropriate.   Appointment on 04/07/2016  Component Date Value Ref Range Status  . WBC 04/07/2016 5.8  3.6 - 11.0 K/uL Final  . RBC 04/07/2016 4.16  3.80 - 5.20 MIL/uL Final  . Hemoglobin 04/07/2016 11.2* 12.0 - 16.0 g/dL Final  . HCT 04/07/2016 34.3* 35.0 - 47.0 % Final  . MCV 04/07/2016 82.6  80.0 - 100.0 fL Final  . MCH 04/07/2016 27.0   26.0 - 34.0 pg Final  . MCHC 04/07/2016 32.7  32.0 - 36.0 g/dL Final  . RDW 04/07/2016 19.0* 11.5 - 14.5 % Final  . Platelets 04/07/2016 263  150 - 440 K/uL Final  . Neutrophils Relative % 04/07/2016 68  % Final  . Neutro Abs 04/07/2016 4.0  1.4 - 6.5 K/uL Final  . Lymphocytes Relative 04/07/2016 17  % Final  . Lymphs Abs 04/07/2016 1.0  1.0 - 3.6 K/uL Final  . Monocytes Relative 04/07/2016 9  % Final  . Monocytes Absolute 04/07/2016 0.5  0.2 - 0.9 K/uL Final  . Eosinophils Relative 04/07/2016 5  % Final  . Eosinophils Absolute 04/07/2016 0.3  0 - 0.7 K/uL Final  . Basophils Relative 04/07/2016 1  % Final  . Basophils Absolute 04/07/2016 0.1  0 - 0.1 K/uL Final  . Ferritin  04/07/2016 33  11 - 307 ng/mL Final  . Iron 04/07/2016 42  28 - 170 ug/dL Final  . TIBC 04/07/2016 342  250 - 450 ug/dL Final  . Saturation Ratios 04/07/2016 12  10.4 - 31.8 % Final  . UIBC 04/07/2016 300  ug/dL Final    Assessment:  Alexis Haas is a 83 y.o. female with iron deficiency anemia felt secondary to poor absorption.    Given her age, decision was made for no invasive procedures (EGD or colonoscopy).  Guaiac cards were negative in 12/2014.  Diet is modest.  She notes oral iron is ineffective.  She received 2 units PRBCs on 10/11/2015 for a hematocrit of 21.1 and hemoglobin 6.4.  She has received Feraheme on several occasions:  01/11/2015, 02/01/2015, 03/15/2015, 03/22/2015, 10/16/2015, 01/09/2016, and 03/05/2016.    Ferritin has been monitored: 5 on 01/02/2015, 36 on 01/31/2105, 26 on 03/08/2015, 184 on 05/02/2015, 7 on 10/10/2015, 91 on 11/07/2015, 7 on 01/02/2016, 14 on 03/03/2016, and 33 on 04/07/2016.  She has received IV iron with improvement in her ferritin and hematocrit, but with subsequent decline after 3 months.  She denies any bleeding.  Guaiac cards x 3 were negative (10/2015) then positive (01/2016).  Diet has improved.  Symptomatically, she denies any complaints.  Hematocrit has improved  after IV iron.  Ferritin is 33.  Weight has improved.  Exam reveals sequela from trauma 3 weeks ago.  Plan: 1.  Review labs from 04/07/2016.   Hematocrit and ferritin are adequate.  Discuss likely need for Feraheme next month based on prior trends.   2.  Discuss patient's thoughts about endoscopy.  She feels comfortable about her decision to proceed next week. 3.  RTC in 1 month for MD assessment, labs (CBC with diff, ferritin- day before) and +/- Feraheme.   Lequita Asal, MD  04/09/2016, 2:19 PM

## 2016-04-09 NOTE — Progress Notes (Signed)
Patient states she fell about 3 weeks ago at work. She has bruises on her face from the fall.  She states she does not sleep well at night.  Does not nap during the day.

## 2016-04-15 ENCOUNTER — Ambulatory Visit: Payer: PPO | Admitting: Anesthesiology

## 2016-04-15 ENCOUNTER — Ambulatory Visit
Admission: RE | Admit: 2016-04-15 | Discharge: 2016-04-15 | Disposition: A | Discharge status: Home / Self Care | Payer: PPO | Source: Ambulatory Visit | Attending: Gastroenterology | Admitting: Gastroenterology

## 2016-04-15 ENCOUNTER — Encounter
Admission: RE | Disposition: A | Discharge status: Home / Self Care | Payer: Self-pay | Source: Ambulatory Visit | Attending: Gastroenterology

## 2016-04-15 ENCOUNTER — Encounter: Payer: Self-pay | Admitting: *Deleted

## 2016-04-15 DIAGNOSIS — K259 Gastric ulcer, unspecified as acute or chronic, without hemorrhage or perforation: Secondary | ICD-10-CM | POA: Insufficient documentation

## 2016-04-15 DIAGNOSIS — K449 Diaphragmatic hernia without obstruction or gangrene: Secondary | ICD-10-CM | POA: Insufficient documentation

## 2016-04-15 DIAGNOSIS — K56699 Other intestinal obstruction unspecified as to partial versus complete obstruction: Secondary | ICD-10-CM | POA: Diagnosis not present

## 2016-04-15 DIAGNOSIS — D500 Iron deficiency anemia secondary to blood loss (chronic): Secondary | ICD-10-CM | POA: Diagnosis not present

## 2016-04-15 DIAGNOSIS — Z7982 Long term (current) use of aspirin: Secondary | ICD-10-CM | POA: Diagnosis not present

## 2016-04-15 DIAGNOSIS — I10 Essential (primary) hypertension: Secondary | ICD-10-CM | POA: Insufficient documentation

## 2016-04-15 DIAGNOSIS — D509 Iron deficiency anemia, unspecified: Secondary | ICD-10-CM | POA: Diagnosis not present

## 2016-04-15 DIAGNOSIS — K253 Acute gastric ulcer without hemorrhage or perforation: Secondary | ICD-10-CM

## 2016-04-15 DIAGNOSIS — K219 Gastro-esophageal reflux disease without esophagitis: Secondary | ICD-10-CM | POA: Diagnosis not present

## 2016-04-15 DIAGNOSIS — E7800 Pure hypercholesterolemia, unspecified: Secondary | ICD-10-CM | POA: Insufficient documentation

## 2016-04-15 DIAGNOSIS — C184 Malignant neoplasm of transverse colon: Secondary | ICD-10-CM | POA: Diagnosis not present

## 2016-04-15 DIAGNOSIS — Z79899 Other long term (current) drug therapy: Secondary | ICD-10-CM | POA: Diagnosis not present

## 2016-04-15 DIAGNOSIS — C801 Malignant (primary) neoplasm, unspecified: Secondary | ICD-10-CM

## 2016-04-15 DIAGNOSIS — Z951 Presence of aortocoronary bypass graft: Secondary | ICD-10-CM | POA: Diagnosis not present

## 2016-04-15 DIAGNOSIS — K5730 Diverticulosis of large intestine without perforation or abscess without bleeding: Secondary | ICD-10-CM | POA: Insufficient documentation

## 2016-04-15 DIAGNOSIS — K6389 Other specified diseases of intestine: Secondary | ICD-10-CM | POA: Diagnosis not present

## 2016-04-15 HISTORY — PX: ESOPHAGOGASTRODUODENOSCOPY (EGD) WITH PROPOFOL: SHX5813

## 2016-04-15 HISTORY — DX: Malignant (primary) neoplasm, unspecified: C80.1

## 2016-04-15 HISTORY — PX: COLONOSCOPY WITH PROPOFOL: SHX5780

## 2016-04-15 SURGERY — COLONOSCOPY WITH PROPOFOL
Anesthesia: General

## 2016-04-15 MED ORDER — LIDOCAINE HCL (PF) 2 % IJ SOLN
INTRAMUSCULAR | Status: AC
Start: 2016-04-15 — End: 2016-04-15
  Filled 2016-04-15: qty 2

## 2016-04-15 MED ORDER — SPOT INK MARKER SYRINGE KIT
PACK | SUBMUCOSAL | Status: DC | PRN
  Administered 2016-04-15: 8 mL via SUBMUCOSAL

## 2016-04-15 MED ORDER — FENTANYL CITRATE (PF) 100 MCG/2ML IJ SOLN
INTRAMUSCULAR | Status: AC
  Filled 2016-04-15: qty 2

## 2016-04-15 MED ORDER — GLYCOPYRROLATE 0.2 MG/ML IJ SOLN
INTRAMUSCULAR | Status: DC | PRN
  Administered 2016-04-15: 0.2 mg via INTRAVENOUS

## 2016-04-15 MED ORDER — PROPOFOL 500 MG/50ML IV EMUL
INTRAVENOUS | Status: AC
  Filled 2016-04-15: qty 50

## 2016-04-15 MED ORDER — PROPOFOL 500 MG/50ML IV EMUL
INTRAVENOUS | Status: DC | PRN
  Administered 2016-04-15: 100 ug/kg/min via INTRAVENOUS

## 2016-04-15 MED ORDER — FENTANYL CITRATE (PF) 100 MCG/2ML IJ SOLN
INTRAMUSCULAR | Status: DC | PRN
  Administered 2016-04-15 (×2): 25 ug via INTRAVENOUS

## 2016-04-15 MED ORDER — PHENYLEPHRINE HCL 10 MG/ML IJ SOLN
INTRAMUSCULAR | Status: DC | PRN
  Administered 2016-04-15: 100 ug via INTRAVENOUS

## 2016-04-15 MED ORDER — LIDOCAINE HCL (PF) 2 % IJ SOLN
INTRAMUSCULAR | Status: DC | PRN
  Administered 2016-04-15: 40 mg via INTRADERMAL

## 2016-04-15 MED ORDER — GLYCOPYRROLATE 0.2 MG/ML IJ SOLN
INTRAMUSCULAR | Status: AC
  Filled 2016-04-15: qty 1

## 2016-04-15 MED ORDER — PROPOFOL 10 MG/ML IV BOLUS
INTRAVENOUS | Status: DC | PRN
  Administered 2016-04-15 (×2): 10 mg via INTRAVENOUS
  Administered 2016-04-15: 50 mg via INTRAVENOUS

## 2016-04-15 MED ORDER — SODIUM CHLORIDE 0.9 % IV SOLN
INTRAVENOUS | Status: DC
  Administered 2016-04-15: 08:00:00 via INTRAVENOUS

## 2016-04-15 NOTE — Op Note (Signed)
[facility] Patient Name: Alexis Haas Procedure Date: 04/15/2016 8:26 AM MRN: [id number] Account #: [id number] Date of Birth: 21-Mar-1917 Admit Type: Outpatient Age: 83 Room: [facility] ENDO ROOM 4 Gender: Female Note Status: Finalized Procedure:            Colonoscopy Indications:          Iron deficiency anemia Providers:            Lucilla Lame MD, MD Referring MD:         Lynnell Jude (Referring MD) Medicines:            Propofol per Anesthesia Complications:        No immediate complications. Procedure:            Pre-Anesthesia Assessment:                       - Prior to the procedure, a History and Physical was                        performed, and patient medications and allergies were                        reviewed. The patient's tolerance of previous                        anesthesia was also reviewed. The risks and benefits of                        the procedure and the sedation options and risks were                        discussed with the patient. All questions were                        answered, and informed consent was obtained. Prior                        Anticoagulants: The patient has taken no previous                        anticoagulant or antiplatelet agents. ASA Grade                        Assessment: II - A patient with mild systemic disease.                        After reviewing the risks and benefits, the patient was                        deemed in satisfactory condition to undergo the                        procedure.                       After obtaining informed consent, the colonoscope was                        passed under direct vision. Throughout the procedure,  the patient's blood pressure, pulse, and oxygen                        saturations were monitored continuously. The Olympus                        CF-H180AL colonoscope ( S#: [id number] ) was introduced   through the anus and advanced to the the transverse                        colon. The colonoscopy was performed without                        difficulty. The patient tolerated the procedure well.                        The quality of the bowel preparation was excellent. Findings:      The perianal and digital rectal examinations were normal.      A few small-mouthed diverticula were found in the sigmoid colon.      A severe stenosis was found in the transverse colon and was       non-traversed. Biopsies were taken with a cold forceps for histology.       Area was successfully injected with 4 mL Spot (carbon black) for       tattooing. Impression:           - Diverticulosis in the sigmoid colon.                       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 Biopsied. Injected                        with ink. Recommendation:       - Await pathology results. Procedure Code(s):    --- Professional ---                       [phone], 62, Colonoscopy, flexible; with biopsy, single                        or multiple Diagnosis Code(s):    --- Professional ---                       D50.9, Iron deficiency anemia, unspecified                       K56.69, Other intestinal obstruction CPT copyright 2016 American Medical Association. All rights reserved. The codes documented in this report are preliminary and upon coder review may  be revised to meet current compliance requirements. Lucilla Lame MD, MD 04/15/2016 9:05:12 AM This report has been signed electronically. Number of Addenda: 0 Note Initiated On: 04/15/2016 8:26 AM Scope Withdrawal Time: 0 hours 3 minutes 4 seconds  Total Procedure Duration: 0 hours 11 minutes 47 seconds       [facility]

## 2016-04-15 NOTE — Anesthesia Preprocedure Evaluation (Signed)
Anesthesia Evaluation  Patient identified by MRN, date of birth, ID band Patient awake    Reviewed: Allergy & Precautions, NPO status , Patient's Chart, lab work & pertinent test results  History of Anesthesia Complications Negative for: history of anesthetic complications  Airway Mallampati: II  TM Distance: >3 FB Neck ROM: Full    Dental  (+) Edentulous Upper, Missing   Pulmonary neg pulmonary ROS, neg sleep apnea, neg COPD,    breath sounds clear to auscultation- rhonchi (-) wheezing      Cardiovascular hypertension, Pt. 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)   Rhythm:Regular Rate:Normal - Systolic murmurs and - Diastolic murmurs    Neuro/Psych negative neurological ROS  negative psych ROS   GI/Hepatic Neg liver ROS, GERD  ,  Endo/Other  negative endocrine ROS  Renal/GU negative Renal ROS     Musculoskeletal  (+) Arthritis ,   Abdominal (+) + obese,   Peds  Hematology  (+) anemia ,   Anesthesia Other Findings Past Medical History: No date: Anemia No date: Arthritis No date: GERD (gastroesophageal reflux disease) No date: Heart disease No date: Hypercholesteremia No date: Hypertension No date: Insomnia   Reproductive/Obstetrics                             Anesthesia Physical Anesthesia Plan  ASA: III  Anesthesia Plan: General   Post-op Pain Management:    Induction: Intravenous  Airway Management Planned: Natural Airway  Additional Equipment:   Intra-op Plan:   Post-operative Plan:   Informed Consent: I have reviewed the patients History and Physical, chart, labs and discussed the procedure including the risks, benefits and alternatives for the proposed anesthesia with the patient or authorized representative who has indicated his/her understanding and acceptance.   Dental advisory given  Plan Discussed with: CRNA and Anesthesiologist  Anesthesia Plan Comments:          Anesthesia Quick Evaluation

## 2016-04-15 NOTE — Op Note (Addendum)
[facility] Gastroenterology Patient Name: Alexis Haas Procedure Date: 04/15/2016 8:26 AM MRN: [id number] Account #: [id number] Date of Birth: [birth year] Admit Type: Outpatient Age: 83 Room: [facility] ENDO ROOM 4 Gender: Female Note Status: Finalized Procedure:            Upper GI endoscopy Indications:          Iron deficiency anemia Providers:            Lucilla Lame MD, MD Referring MD:         Lynnell Jude (Referring MD) Medicines:            Propofol per Anesthesia Complications:        No immediate complications. Procedure:            Pre-Anesthesia Assessment:                       - Prior to the procedure, a History and Physical was                        performed, and patient medications and allergies were                        reviewed. The patient's tolerance of previous                        anesthesia was also reviewed. The risks and benefits of                        the procedure and the sedation options and risks were                        discussed with the patient. All questions were                        answered, and informed consent was obtained. Prior                        Anticoagulants: The patient has taken no previous                        anticoagulant or antiplatelet agents. ASA Grade                        Assessment: II - A patient with mild systemic disease.                        After reviewing the risks and benefits, the patient was                        deemed in satisfactory condition to undergo the                        procedure.                       After obtaining informed consent, the endoscope was                        passed under direct vision. Throughout the procedure,  the patient's blood pressure, pulse, and oxygen                        saturations were monitored continuously. The                        Colonoscope was introduced through the mouth, and                        advanced to  the second part of duodenum. The upper GI                        endoscopy was accomplished without difficulty. The                        patient tolerated the procedure well. Findings:      A medium-sized hiatal hernia was present.      One non-bleeding linear gastric ulcer with no stigmata of bleeding was       found in the gastric antrum.      The examined duodenum was normal. Impression:           - Medium-sized hiatal hernia.                       - Non-bleeding gastric ulcer with no stigmata of                        bleeding.                       - Normal examined duodenum.                       - No specimens collected. Recommendation:       - Discharge patient to home.                       - Resume previous diet.                       - Continue present medications.                       - Use a proton pump inhibitor PO daily. Procedure Code(s):    --- Professional ---                       [phone], Esophagogastroduodenoscopy, flexible, transoral;                        diagnostic, including collection of specimen(s) by                        brushing or washing, when performed (separate procedure) Diagnosis Code(s):    --- Professional ---                       D50.9, Iron deficiency anemia, unspecified                       K25.9, Gastric ulcer, unspecified as acute or chronic,  without hemorrhage or perforation CPT copyright 2016 American Medical Association. All rights reserved. The codes documented in this report are preliminary and upon coder review may  be revised to meet current compliance requirements. Lucilla Lame MD, MD 04/15/2016 8:45:54 AM This report has been signed electronically. Number of Addenda: 0 Note Initiated On: 04/15/2016 8:26 AM      [facility]

## 2016-04-15 NOTE — Anesthesia Procedure Notes (Signed)
Date/Time: 04/15/2016 8:32 AM Performed by: Hedda Slade Pre-anesthesia Checklist: Patient identified, Emergency Drugs available, Suction available and Patient being monitored Oxygen Delivery Method: Nasal cannula

## 2016-04-15 NOTE — H&P (Signed)
  Lucilla Lame, MD [address] [place], [place] [address] Phone: [phone] Fax : [phone]  Primary Care Physician:  Lynnell Jude, MD Primary Gastroenterologist:  Dr. Allen Norris  Pre-Procedure History & Physical: HPI:  Alexis Haas is a 83 y.o. female is here for an endoscopy and colonoscopy.   Past Medical History:  Diagnosis Date  . Anemia   . Arthritis   . GERD (gastroesophageal reflux disease)   . Heart disease   . Hypercholesteremia   . Hypertension   . Insomnia     Past Surgical History:  Procedure Laterality Date  . CARDIAC SURGERY  2005  . toe removal    . TONSILLECTOMY      Prior to Admission medications   Medication Sig Start Date End Date Taking? Authorizing Provider  aspirin 81 MG tablet Take 81 mg by mouth daily.   Yes Historical Provider, MD  hydrochlorothiazide (HYDRODIURIL) 25 MG tablet Take 25 mg by mouth daily.   Yes Historical Provider, MD  lovastatin (MEVACOR) 40 MG tablet Take 40 mg by mouth at bedtime.   Yes Historical Provider, MD  metoprolol tartrate (LOPRESSOR) 25 MG tablet Take 25 mg by mouth 2 (two) times daily.   Yes Historical Provider, MD  omeprazole (PRILOSEC) 20 MG capsule Take 20 mg by mouth daily.   Yes Historical Provider, MD  naproxen sodium (ANAPROX) 220 MG tablet Take 220 mg by mouth daily as needed.    Historical Provider, MD  sulfamethoxazole-trimethoprim (BACTRIM DS,SEPTRA DS) 800-160 MG tablet Take 1 tablet by mouth 2 (two) times daily. Patient not taking: Reported on 03/05/2016 01/31/16   Lloyd Huger, MD    Allergies as of 04/02/2016 - Review Complete 03/26/2016  Allergen Reaction Noted  . Amoxicillin Rash 01/02/2015    History reviewed. No pertinent family history.  Social History   Social History  . Marital status: Widowed    Spouse name: N/A  . Number of children: N/A  . Years of education: N/A   Occupational History  . Not on file.   Social History Main Topics  . Smoking status: Never  Smoker  . Smokeless tobacco: Never Used  . Alcohol use No  . Drug use: No  . Sexual activity: Not on file   Other Topics Concern  . Not on file   Social History Narrative  . No narrative on file    Review of Systems: See HPI, otherwise negative ROS  Physical Exam: BP (!) 158/73   Pulse 88   Temp (!) 96.3 F (35.7 C) (Tympanic)   Resp 20   Ht 5\' 3"  (1.6 m)   Wt 170 lb (77.1 kg)   SpO2 96%   BMI 30.11 kg/m  General:   Alert,  pleasant and cooperative in NAD Head:  Normocephalic and atraumatic. Neck:  Supple; no masses or thyromegaly. Lungs:  Clear throughout to auscultation.    Heart:  Regular rate and rhythm. Abdomen:  Soft, nontender and nondistended. Normal bowel sounds, without guarding, and without rebound.   Neurologic:  Alert and  oriented x4;  grossly normal neurologically.  Impression/Plan: Alexis Haas is here for an endoscopy and colonoscopy to be performed for IDA  Risks, benefits, limitations, and alternatives regarding  endoscopy and colonoscopy have been reviewed with the patient.  Questions have been answered.  All parties agreeable.   Lucilla Lame, MD  04/15/2016, 8:24 AM

## 2016-04-15 NOTE — Transfer of Care (Signed)
Immediate Anesthesia Transfer of Care Note  Patient: Alexis Haas  Procedure(s) Performed: Procedure(s): COLONOSCOPY WITH PROPOFOL (N/A) ESOPHAGOGASTRODUODENOSCOPY (EGD) WITH PROPOFOL (N/A)  Patient Location: PACU  Anesthesia Type:General  Level of Consciousness: sedated  Airway & Oxygen Therapy: Patient Spontanous Breathing and Patient connected to nasal cannula oxygen  Post-op Assessment: Report given to RN and Post -op Vital signs reviewed and stable  Post vital signs: Reviewed and stable  Last Vitals:  Vitals:   04/15/16 0907 04/15/16 0909  BP:  (!) 122/50  Pulse:  89  Resp:  15  Temp: (!) (P) 36 C (!) 36 C    Last Pain:  Vitals:   04/15/16 0909  TempSrc: Temporal  PainSc: Asleep         Complications: No apparent anesthesia complications

## 2016-04-15 NOTE — Anesthesia Postprocedure Evaluation (Signed)
Anesthesia Post Note  Patient: MIKEL KAELIN  Procedure(s) Performed: Procedure(s) (LRB): COLONOSCOPY WITH PROPOFOL (N/A) ESOPHAGOGASTRODUODENOSCOPY (EGD) WITH PROPOFOL (N/A)  Patient location during evaluation: Endoscopy Anesthesia Type: General Level of consciousness: awake and alert and oriented Pain management: pain level controlled Vital Signs Assessment: post-procedure vital signs reviewed and stable Respiratory status: spontaneous breathing, nonlabored ventilation and respiratory function stable Cardiovascular status: blood pressure returned to baseline and stable Postop Assessment: no signs of nausea or vomiting Anesthetic complications: no     Last Vitals:  Vitals:   04/15/16 0917 04/15/16 0927  BP: (!) 114/56 135/64  Pulse: 92 96  Resp: 15 14  Temp:      Last Pain:  Vitals:   04/15/16 0909  TempSrc: Temporal  PainSc: Asleep                 Janit Cutter

## 2016-04-15 NOTE — Anesthesia Post-op Follow-up Note (Cosign Needed)
Anesthesia QCDR form completed.        

## 2016-04-16 ENCOUNTER — Encounter: Payer: Self-pay | Admitting: Gastroenterology

## 2016-04-16 LAB — SURGICAL PATHOLOGY

## 2016-04-18 ENCOUNTER — Telehealth: Payer: Self-pay | Admitting: Gastroenterology

## 2016-04-18 NOTE — Telephone Encounter (Signed)
When results come back please do not call patient, call daughter Opal Sidles [phone]

## 2016-04-21 ENCOUNTER — Telehealth: Payer: Self-pay

## 2016-04-21 ENCOUNTER — Ambulatory Visit: Payer: PPO | Admitting: Gastroenterology

## 2016-04-21 NOTE — Telephone Encounter (Signed)
Pt scheduled for a follow up appt today to discuss results.

## 2016-04-21 NOTE — Telephone Encounter (Signed)
-----   Message from Lucilla Lame, MD sent at 04/17/2016  1:29 PM EST ----- Please have the patient come in for a follow up.

## 2016-04-21 NOTE — Telephone Encounter (Signed)
Pt scheduled for an office appt today to discuss results.

## 2016-04-22 ENCOUNTER — Ambulatory Visit (INDEPENDENT_AMBULATORY_CARE_PROVIDER_SITE_OTHER): Payer: PPO | Admitting: Gastroenterology

## 2016-04-22 ENCOUNTER — Encounter: Payer: Self-pay | Admitting: Gastroenterology

## 2016-04-22 VITALS — BP 129/56 | HR 75 | Temp 97.6°F | Ht 63.0 in | Wt 168.0 lb

## 2016-04-22 DIAGNOSIS — C184 Malignant neoplasm of transverse colon: Secondary | ICD-10-CM | POA: Diagnosis not present

## 2016-04-22 NOTE — Progress Notes (Signed)
   Primary Care Physician: Lynnell Jude, MD  Primary Gastroenterologist:  Dr. Lucilla Lame  Chief Complaint  Patient presents with  . Follow up procedure results    HPI: Alexis Haas is a 83 y.o. female here for follow-up after having an EGD and colonoscopy. The patient was found to have a transverse colon mass on her colonoscopy. The patient was undergoing these tests because of iron deficiency anemia. The patient comes in today for review of the pathology results. The tumor was so large that the mass could not be passed with the scope. She denies any symptoms of obstruction at the present time such as bloating or abdominal pain.  Current Outpatient Prescriptions  Medication Sig Dispense Refill  . aspirin 81 MG tablet Take 81 mg by mouth daily.    . hydrochlorothiazide (HYDRODIURIL) 25 MG tablet Take 25 mg by mouth daily.    Marland Kitchen lovastatin (MEVACOR) 40 MG tablet Take 40 mg by mouth at bedtime.    . metoprolol tartrate (LOPRESSOR) 25 MG tablet Take 25 mg by mouth 2 (two) times daily.    . naproxen sodium (ANAPROX) 220 MG tablet Take 220 mg by mouth daily as needed.    Marland Kitchen omeprazole (PRILOSEC) 20 MG capsule Take 20 mg by mouth daily.    Marland Kitchen sulfamethoxazole-trimethoprim (BACTRIM DS,SEPTRA DS) 800-160 MG tablet Take 1 tablet by mouth 2 (two) times daily. (Patient not taking: Reported on 03/05/2016) 10 tablet 0   No current facility-administered medications for this visit.     Allergies as of 04/22/2016 - Review Complete 04/22/2016  Allergen Reaction Noted  . Amoxicillin Rash 01/02/2015    ROS:  General: Negative for anorexia, weight loss, fever, chills, fatigue, weakness. ENT: Negative for hoarseness, difficulty swallowing , nasal congestion. CV: Negative for chest pain, angina, palpitations, dyspnea on exertion, peripheral edema.  Respiratory: Negative for dyspnea at rest, dyspnea on exertion, cough, sputum, wheezing.  GI: See history of present illness. GU:  Negative for dysuria,  hematuria, urinary incontinence, urinary frequency, nocturnal urination.  Endo: Negative for unusual weight change.    Physical Examination:   BP (!) 129/56   Pulse 75   Temp 97.6 F (36.4 C) (Oral)   Ht 5\' 3"  (1.6 m)   Wt 168 lb (76.2 kg)   BMI 29.76 kg/m   General: Well-nourished, well-developed in no acute distress.  Eyes: No icterus. Conjunctivae pink. Extremities: No lower extremity edema. No clubbing or deformities. Neuro: Alert and oriented x 3.  Grossly intact. Skin: Warm and dry, no jaundice.   Psych: Alert and cooperative, normal mood and affect.  Labs:    Imaging Studies: No results found.  Assessment and Plan:   Alexis Haas is a 83 y.o. y/o female here for follow-up after having an EGD and colonoscopy for anemia. The patient was found to have a colon cancer that was a adenocarcinoma. The patient has been told the results and has been told to follow up with oncology and think about whether the patient wants to undergo any treatment for this cancer. The patient has also been told that it may obstruct and she may need a diverting loop colostomy if she does obstruct.    Lucilla Lame, MD. Marval Regal   Note: This dictation was prepared with Dragon dictation along with smaller phrase technology. Any transcriptional errors that result from this process are unintentional.

## 2016-05-07 ENCOUNTER — Inpatient Hospital Stay: Payer: PPO

## 2016-05-07 ENCOUNTER — Inpatient Hospital Stay: Payer: PPO | Attending: Hematology and Oncology | Admitting: Hematology and Oncology

## 2016-05-07 VITALS — BP 133/61 | HR 60 | Temp 96.8°F | Resp 18 | Wt 168.2 lb

## 2016-05-07 DIAGNOSIS — K219 Gastro-esophageal reflux disease without esophagitis: Secondary | ICD-10-CM | POA: Insufficient documentation

## 2016-05-07 DIAGNOSIS — I10 Essential (primary) hypertension: Secondary | ICD-10-CM | POA: Insufficient documentation

## 2016-05-07 DIAGNOSIS — E7800 Pure hypercholesterolemia, unspecified: Secondary | ICD-10-CM | POA: Insufficient documentation

## 2016-05-07 DIAGNOSIS — K449 Diaphragmatic hernia without obstruction or gangrene: Secondary | ICD-10-CM | POA: Diagnosis not present

## 2016-05-07 DIAGNOSIS — C184 Malignant neoplasm of transverse colon: Secondary | ICD-10-CM

## 2016-05-07 DIAGNOSIS — Z7982 Long term (current) use of aspirin: Secondary | ICD-10-CM | POA: Diagnosis not present

## 2016-05-07 DIAGNOSIS — G4700 Insomnia, unspecified: Secondary | ICD-10-CM | POA: Diagnosis not present

## 2016-05-07 DIAGNOSIS — D509 Iron deficiency anemia, unspecified: Secondary | ICD-10-CM

## 2016-05-07 DIAGNOSIS — D500 Iron deficiency anemia secondary to blood loss (chronic): Secondary | ICD-10-CM | POA: Diagnosis not present

## 2016-05-07 DIAGNOSIS — K259 Gastric ulcer, unspecified as acute or chronic, without hemorrhage or perforation: Secondary | ICD-10-CM | POA: Diagnosis not present

## 2016-05-07 LAB — IRON AND TIBC
Iron: 21 ug/dL — ABNORMAL LOW (ref 28–170)
Saturation Ratios: 5 % — ABNORMAL LOW (ref 10.4–31.8)
TIBC: 386 ug/dL (ref 250–450)
UIBC: 365 ug/dL

## 2016-05-07 LAB — CBC WITH DIFFERENTIAL/PLATELET
Basophils Absolute: 0 10*3/uL (ref 0–0.1)
Basophils Relative: 1 %
Eosinophils Absolute: 0.3 10*3/uL (ref 0–0.7)
Eosinophils Relative: 4 %
HCT: 32.5 % — ABNORMAL LOW (ref 35.0–47.0)
Hemoglobin: 10.7 g/dL — ABNORMAL LOW (ref 12.0–16.0)
Lymphocytes Relative: 14 %
Lymphs Abs: 1.3 10*3/uL (ref 1.0–3.6)
MCH: 26.7 pg (ref 26.0–34.0)
MCHC: 32.8 g/dL (ref 32.0–36.0)
MCV: 81.4 fL (ref 80.0–100.0)
Monocytes Absolute: 0.8 10*3/uL (ref 0.2–0.9)
Monocytes Relative: 9 %
Neutro Abs: 7 10*3/uL — ABNORMAL HIGH (ref 1.4–6.5)
Neutrophils Relative %: 72 %
Platelets: 326 10*3/uL (ref 150–440)
RBC: 4 MIL/uL (ref 3.80–5.20)
RDW: 17.8 % — ABNORMAL HIGH (ref 11.5–14.5)
WBC: 9.6 10*3/uL (ref 3.6–11.0)

## 2016-05-07 LAB — COMPREHENSIVE METABOLIC PANEL
ALT: 21 U/L (ref 14–54)
AST: 21 U/L (ref 15–41)
Albumin: 3.8 g/dL (ref 3.5–5.0)
Alkaline Phosphatase: 61 U/L (ref 38–126)
Anion gap: 8 (ref 5–15)
BUN: 43 mg/dL — ABNORMAL HIGH (ref 6–20)
CO2: 27 mmol/L (ref 22–32)
Calcium: 8.8 mg/dL — ABNORMAL LOW (ref 8.9–10.3)
Chloride: 100 mmol/L — ABNORMAL LOW (ref 101–111)
Creatinine, Ser: 1.5 mg/dL — ABNORMAL HIGH (ref 0.44–1.00)
GFR calc Af Amer: 33 mL/min — ABNORMAL LOW (ref >60)
GFR calc non Af Amer: 28 mL/min — ABNORMAL LOW (ref >60)
Glucose, Bld: 118 mg/dL — ABNORMAL HIGH (ref 65–99)
Potassium: 3.9 mmol/L (ref 3.5–5.1)
Sodium: 135 mmol/L (ref 135–145)
Total Bilirubin: 0.5 mg/dL (ref 0.3–1.2)
Total Protein: 7 g/dL (ref 6.5–8.1)

## 2016-05-07 LAB — FERRITIN: Ferritin: 12 ng/mL (ref 11–307)

## 2016-05-07 NOTE — Progress Notes (Signed)
[facility] day:  05/07/2016  Chief Complaint: Alexis Haas is a 83 y.o. female with iron deficiency anemia who is seen for 1 month assessment after interval endoscopies.  HPI:  The patient was last seen in the hematology clinic on 04/09/2016.  At that time, she denied any complaints.  Hematocrit had improved after IV iron.  Ferritin was 33.  Weight had improved.  Exam revealed sequela from a fall 3 weeks prior.  We discussed my ongoing concern about blood loss given her constant need for IV iron.  She remained hesitant about proceeding to endoscopy because of her age.  She was scheduled for endoscopy.  She underwent EGD and colonoscopy on 04/15/2016 by Dr. Allen Norris.  EGD revealed a medium size hiatal hernia, nonbleeding gastric ulcer with no stigmata of bleeding.  Colonoscopy revealed severe stenosis in the transverse colon. Biopsies confirmed adenocarcinoma.  The patient was seen in follow-up by Dr. Allen Norris on 04/22/2016.  Results were discussed.  Symptomatically, she denies any complaints.  She specifically denies any nausea, vomiting, diarrhea, constipation, melena or hematochezia.  She states that she feels a little fatigued, but relates this to her age.  She was a little light headed a couple of days this week.  She is undecided about what she wants to do about her diagnosis of colon cancer.   Past Medical History:  Diagnosis Date  . Anemia   . Arthritis   . GERD (gastroesophageal reflux disease)   . Heart disease   . Hypercholesteremia   . Hypertension   . Insomnia     Past Surgical History:  Procedure Laterality Date  . CARDIAC SURGERY  2005  . COLONOSCOPY WITH PROPOFOL N/A 04/15/2016   Procedure: COLONOSCOPY WITH PROPOFOL;  Surgeon: Lucilla Lame, MD;  Location: [facility] ENDOSCOPY;  Service: Endoscopy;  Laterality: N/A;  . ESOPHAGOGASTRODUODENOSCOPY (EGD) WITH PROPOFOL N/A 04/15/2016   Procedure: ESOPHAGOGASTRODUODENOSCOPY (EGD) WITH PROPOFOL;   Surgeon: Lucilla Lame, MD;  Location: [facility] ENDOSCOPY;  Service: Endoscopy;  Laterality: N/A;  . toe removal    . TONSILLECTOMY      No family history on file.  Social History:  reports that she has never smoked. She has never used smokeless tobacco. She reports that she does not drink alcohol or use drugs.  She states that she has worked her whole life (since the age of 54).  She works at [employer] part time.  She is head of the shoe department.  She lives in [place].  She lives alone.  Her husband died 60 years ago.  Her daughter is Theodora Blow (home: [phone]; work: [phone]).  The patient is accompanied by her daughter today.  Allergies:  Allergies  Allergen Reactions  . Amoxicillin Rash    Current Medications: Current Outpatient Prescriptions  Medication Sig Dispense Refill  . aspirin 81 MG tablet Take 81 mg by mouth daily.    . hydrochlorothiazide (HYDRODIURIL) 25 MG tablet Take 25 mg by mouth daily.    Marland Kitchen lovastatin (MEVACOR) 40 MG tablet Take 40 mg by mouth at bedtime.    . metoprolol tartrate (LOPRESSOR) 25 MG tablet Take 25 mg by mouth 2 (two) times daily.    . naproxen sodium (ANAPROX) 220 MG tablet Take 220 mg by mouth daily as needed.    Marland Kitchen omeprazole (PRILOSEC) 20 MG capsule Take 20 mg by mouth daily.    Marland Kitchen sulfamethoxazole-trimethoprim (BACTRIM DS,SEPTRA DS) 800-160 MG tablet Take 1 tablet by mouth 2 (  two) times daily. (Patient not taking: Reported on 03/05/2016) 10 tablet 0   No current facility-administered medications for this visit.     Review of Systems:  GENERAL:  Feels a little fatigued.  No fevers or sweats.  Weight down 2 pounds. PERFORMANCE STATUS (ECOG):  1 HEENT:  No visual changes, sore throat, mouth sores or tenderness. Lungs: No shortness of breath or cough.  No hemoptysis. Cardiac:  No chest pain, palpitations, orthopnea, or PND. GI:  No nausea, vomiting, diarrhea, constipation, melena or hematochezia. GU:  No urgency, frequency,  dysuria, or hematuria. Musculoskeletal:  No back pain.  Arthritis in left knee (wears brace).  Can't lift right arm fully up secondary to arthritis.  No muscle tenderness. Extremities:  No pain or swelling. Skin:  No rashes or skin changes. Neuro:  Forgetful.  No headache, numbness or weakness, balance or coordination issues. Endocrine:  No diabetes, thyroid issues, hot flashes or night sweats. Psych:  No mood changes, depression or anxiety. Pain:  No focal pain. Review of systems:  All other systems reviewed and found to be negative.  Physical Exam: Blood pressure 133/61, pulse 60, temperature (!) 96.8 F (36 C), temperature source Tympanic, resp. rate 18, weight 168 lb 3.4 oz (76.3 kg). GENERAL:  Elderly woman sitting comfortably in the exam room in no acute distress.  She has a rolling walker at her side. MENTAL STATUS:  Alert and oriented to person, place and time. HEAD:  Curly gray hair.  Resolved right forehead hematoma and ecchymosis.  Normocephalic, face symmetric, no Cushingoid features. EYES:  Glasses.  Blue eyes.  Pupils equal round and reactive to light and accomodation.  No conjunctivitis or scleral icterus. ENT:  Oropharynx clear without lesion.  Dentures.  Tongue normal. Mucous membranes moist.  RESPIRATORY:  Clear to auscultation without rales, wheezes or rhonchi. CARDIOVASCULAR:  Regular rate and rhythm without murmur, rub or gallop.  No JVD. ABDOMEN:  Soft, non-tender, with active bowel sounds, and no hepatosplenomegaly.  No masses. SKIN:  No rashes, ulcers or lesions. EXTREMITIES: Arthritic changes in hands.  Left knee brace.  Chronic 1+ bilateral ankle edema (left > right).  No skin discoloration or tenderness.  No palpable cords. LYMPH NODES: No palpable cervical, supraclavicular, axillary or inguinal adenopathy  NEUROLOGICAL: Unremarkable. PSYCH:  Appropriate.   Appointment on 05/07/2016  Component Date Value Ref Range Status  . WBC 05/07/2016 9.6  3.6 - 11.0 K/uL  Final  . RBC 05/07/2016 4.00  3.80 - 5.20 MIL/uL Final  . Hemoglobin 05/07/2016 10.7* 12.0 - 16.0 g/dL Final  . HCT 05/07/2016 32.5* 35.0 - 47.0 % Final  . MCV 05/07/2016 81.4  80.0 - 100.0 fL Final  . MCH 05/07/2016 26.7  26.0 - 34.0 pg Final  . MCHC 05/07/2016 32.8  32.0 - 36.0 g/dL Final  . RDW 05/07/2016 17.8* 11.5 - 14.5 % Final  . Platelets 05/07/2016 326  150 - 440 K/uL Final  . Neutrophils Relative % 05/07/2016 72  % Final  . Neutro Abs 05/07/2016 7.0* 1.4 - 6.5 K/uL Final  . Lymphocytes Relative 05/07/2016 14  % Final  . Lymphs Abs 05/07/2016 1.3  1.0 - 3.6 K/uL Final  . Monocytes Relative 05/07/2016 9  % Final  . Monocytes Absolute 05/07/2016 0.8  0.2 - 0.9 K/uL Final  . Eosinophils Relative 05/07/2016 4  % Final  . Eosinophils Absolute 05/07/2016 0.3  0 - 0.7 K/uL Final  . Basophils Relative 05/07/2016 1  % Final  . Basophils Absolute  05/07/2016 0.0  0 - 0.1 K/uL Final  . Sodium 05/07/2016 135  135 - 145 mmol/L Final  . Potassium 05/07/2016 3.9  3.5 - 5.1 mmol/L Final  . Chloride 05/07/2016 100* 101 - 111 mmol/L Final  . CO2 05/07/2016 27  22 - 32 mmol/L Final  . Glucose, Bld 05/07/2016 118* 65 - 99 mg/dL Final  . BUN 05/07/2016 43* 6 - 20 mg/dL Final  . Creatinine, Ser 05/07/2016 1.50* 0.44 - 1.00 mg/dL Final  . Calcium 05/07/2016 8.8* 8.9 - 10.3 mg/dL Final  . Total Protein 05/07/2016 7.0  6.5 - 8.1 g/dL Final  . Albumin 05/07/2016 3.8  3.5 - 5.0 g/dL Final  . AST 05/07/2016 21  15 - 41 U/L Final  . ALT 05/07/2016 21  14 - 54 U/L Final  . Alkaline Phosphatase 05/07/2016 61  38 - 126 U/L Final  . Total Bilirubin 05/07/2016 0.5  0.3 - 1.2 mg/dL Final  . GFR calc non Af Amer 05/07/2016 28* >60 mL/min Final  . GFR calc Af Amer 05/07/2016 33* >60 mL/min Final   Comment: (NOTE) The eGFR has been calculated using the CKD EPI equation. This calculation has not been validated in all clinical situations. eGFR's persistently <60 mL/min signify possible Chronic  Kidney Disease.   . Anion gap 05/07/2016 8  5 - 15 Final    Assessment:  MEGHEN AKOPYAN is a 83 y.o. 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  Given her age, decision was made previously for no invasive procedures (EGD or colonoscopy).  Guaiac cards were negative in 12/2014 then positive in 01/2016.  She was diagnosed with adenocarcinoma of the transverse colon on 04/15/2016.  Diet is modest.  She notes oral iron is ineffective.  She received 2 units PRBCs on 10/11/2015 for a hematocrit of 21.1 and hemoglobin 6.4.  She has received Feraheme on several occasions:  01/11/2015, 02/01/2015, 03/15/2015, 03/22/2015, 10/16/2015, 01/09/2016, and 03/05/2016.    Ferritin has been monitored: 5 on 01/02/2015, 36 on 01/31/2105, 26 on 03/08/2015, 184 on 05/02/2015, 7 on 10/10/2015, 91 on 11/07/2015, 7 on 01/02/2016, 14 on 03/03/2016, 33 on 04/07/2016, and 12 on 05/07/2016.  She has received IV iron with improvement in her ferritin and hematocrit, but with subsequent decline after 3 months.  She denies any bleeding.  Guaiac cards x 3 were negative (10/2015) then positive (01/2016).  Diet has improved.  EGD on 04/15/2016 revealed a medium size hiatal hernia, nonbleeding gastric ulcer with no stigmata of bleeding.  Colonoscopy on 04/15/2016 revealed severe stenosis in the transverse colon.  Biopsies confirmed adenocarcinoma.  Symptomatically, she denies any GI complaints.  Exam is unremarkable.  Hematocrit is 32.5 with a hemoglobin of 10.7.  Ferritin is pending.  Plan: 1.  Labs today:  CBC with diff, CMP, CEA, ferritin, iron studies. 2.  Discuss results of colonoscopy and new diagnosis of adenocarcinoma of the transverse colon.  Discuss staging studies (CT versus PET).  Given increased creatinine, will schedule PET scan scan to avoid IV contrast dye.  Discuss referral to surgery.  Patient hesistant about surgery given her age and associated risk.  Discuss risk for bowel obstruction  given severe stenosis.  Discuss presentation at tumor board.  3.  PET scan 4.  Present at tumor board on 05/08/2016. 5.  RTC in 2 weeks for MD assessment.  Addendum:  Patient was not presented at tumor board.  Will discuss with surgery.  Ferritin was 12.  Will schedule Feraheme.  Lequita Asal, MD  05/07/2016, 2:16 PM

## 2016-05-07 NOTE — Progress Notes (Signed)
Patient states she was a little lightheaded a couple of days earlier this week.  No complaints today.  States she has has trouble going to sleep.

## 2016-05-08 ENCOUNTER — Ambulatory Visit: Payer: PPO

## 2016-05-08 LAB — CEA: CEA: 6.4 ng/mL — ABNORMAL HIGH (ref 0.0–4.7)

## 2016-05-09 ENCOUNTER — Inpatient Hospital Stay: Payer: PPO

## 2016-05-09 VITALS — BP 116/71 | HR 58 | Temp 95.7°F | Resp 18

## 2016-05-09 DIAGNOSIS — C184 Malignant neoplasm of transverse colon: Secondary | ICD-10-CM | POA: Diagnosis not present

## 2016-05-09 DIAGNOSIS — D509 Iron deficiency anemia, unspecified: Secondary | ICD-10-CM

## 2016-05-09 MED ORDER — SODIUM CHLORIDE 0.9 % IV SOLN
Freq: Once | INTRAVENOUS | Status: AC
  Administered 2016-05-09: 09:00:00 via INTRAVENOUS
  Filled 2016-05-09: qty 1000

## 2016-05-09 MED ORDER — FERUMOXYTOL INJECTION 510 MG/17 ML
510.0000 mg | Freq: Once | INTRAVENOUS | Status: AC
  Administered 2016-05-09: 510 mg via INTRAVENOUS
  Filled 2016-05-09: qty 17

## 2016-05-09 NOTE — Patient Instructions (Signed)

## 2016-05-13 ENCOUNTER — Other Ambulatory Visit: Payer: Self-pay | Admitting: *Deleted

## 2016-05-13 ENCOUNTER — Telehealth: Payer: Self-pay

## 2016-05-13 ENCOUNTER — Encounter: Payer: Self-pay | Admitting: Hematology and Oncology

## 2016-05-13 DIAGNOSIS — D509 Iron deficiency anemia, unspecified: Secondary | ICD-10-CM

## 2016-05-13 NOTE — Telephone Encounter (Signed)
Spoke with the patient about seeing Dr Bary Castilla for consultation with her daughter Opal Sidles. Permission obtained to speak with her daughter about scheduling this. Spoke with Theodora Blow and agreed to see Dr Bary Castilla. The patient is scheduled for a PET scan on 05/19/16 and she wanted to come after this was completed. The patient is scheduled for an appointment on 05/21/16 at 2:00 pm.

## 2016-05-13 NOTE — Telephone Encounter (Signed)
-----   Message from Robert Bellow, MD sent at 05/13/2016 12:12 PM EDT ----- Dr. Mike Gip asked that we contact the patient re: colon cancer. Would like to schedule an appointment when her daughter is available. Her daughter is Theodora Blow (home: [phone]; work: [phone]).   See if they are willing to come in. Thanks.

## 2016-05-19 ENCOUNTER — Encounter
Admission: RE | Admit: 2016-05-19 | Discharge: 2016-05-19 | Disposition: A | Discharge status: Home / Self Care | Payer: PPO | Source: Ambulatory Visit | Attending: Hematology and Oncology | Admitting: Hematology and Oncology

## 2016-05-19 DIAGNOSIS — K639 Disease of intestine, unspecified: Secondary | ICD-10-CM | POA: Diagnosis not present

## 2016-05-19 DIAGNOSIS — C184 Malignant neoplasm of transverse colon: Secondary | ICD-10-CM

## 2016-05-19 DIAGNOSIS — C189 Malignant neoplasm of colon, unspecified: Secondary | ICD-10-CM | POA: Diagnosis not present

## 2016-05-19 DIAGNOSIS — K449 Diaphragmatic hernia without obstruction or gangrene: Secondary | ICD-10-CM | POA: Diagnosis not present

## 2016-05-19 LAB — GLUCOSE, CAPILLARY: Glucose-Capillary: 83 mg/dL (ref 65–99)

## 2016-05-19 MED ORDER — FLUDEOXYGLUCOSE F - 18 (FDG) INJECTION
13.1100 | Freq: Once | INTRAVENOUS | Status: AC | PRN
  Administered 2016-05-19: 13.11 via INTRAVENOUS

## 2016-05-21 ENCOUNTER — Ambulatory Visit: Payer: PPO | Admitting: Hematology and Oncology

## 2016-05-21 ENCOUNTER — Encounter: Payer: Self-pay | Admitting: Hematology and Oncology

## 2016-05-21 ENCOUNTER — Encounter: Payer: Self-pay | Admitting: General Surgery

## 2016-05-21 ENCOUNTER — Ambulatory Visit (INDEPENDENT_AMBULATORY_CARE_PROVIDER_SITE_OTHER): Payer: PPO | Admitting: General Surgery

## 2016-05-21 ENCOUNTER — Inpatient Hospital Stay: Payer: PPO | Attending: Hematology and Oncology | Admitting: Hematology and Oncology

## 2016-05-21 VITALS — BP 129/74 | HR 50 | Temp 96.4°F | Resp 18 | Wt 169.1 lb

## 2016-05-21 VITALS — BP 148/66 | HR 56 | Resp 14 | Ht 63.5 in | Wt 171.0 lb

## 2016-05-21 DIAGNOSIS — C184 Malignant neoplasm of transverse colon: Secondary | ICD-10-CM

## 2016-05-21 DIAGNOSIS — C182 Malignant neoplasm of ascending colon: Secondary | ICD-10-CM | POA: Insufficient documentation

## 2016-05-21 DIAGNOSIS — K219 Gastro-esophageal reflux disease without esophagitis: Secondary | ICD-10-CM | POA: Diagnosis not present

## 2016-05-21 DIAGNOSIS — E7800 Pure hypercholesterolemia, unspecified: Secondary | ICD-10-CM | POA: Diagnosis not present

## 2016-05-21 DIAGNOSIS — D500 Iron deficiency anemia secondary to blood loss (chronic): Secondary | ICD-10-CM

## 2016-05-21 DIAGNOSIS — K5900 Constipation, unspecified: Secondary | ICD-10-CM | POA: Diagnosis not present

## 2016-05-21 DIAGNOSIS — K259 Gastric ulcer, unspecified as acute or chronic, without hemorrhage or perforation: Secondary | ICD-10-CM | POA: Diagnosis not present

## 2016-05-21 DIAGNOSIS — K449 Diaphragmatic hernia without obstruction or gangrene: Secondary | ICD-10-CM | POA: Diagnosis not present

## 2016-05-21 DIAGNOSIS — I10 Essential (primary) hypertension: Secondary | ICD-10-CM | POA: Insufficient documentation

## 2016-05-21 DIAGNOSIS — Z7982 Long term (current) use of aspirin: Secondary | ICD-10-CM | POA: Insufficient documentation

## 2016-05-21 DIAGNOSIS — Z79899 Other long term (current) drug therapy: Secondary | ICD-10-CM | POA: Insufficient documentation

## 2016-05-21 DIAGNOSIS — G4700 Insomnia, unspecified: Secondary | ICD-10-CM | POA: Insufficient documentation

## 2016-05-21 HISTORY — DX: Malignant neoplasm of ascending colon: C18.2

## 2016-05-21 MED ORDER — NEOMYCIN SULFATE 500 MG PO TABS: ORAL_TABLET | ORAL | 0 refills | Status: DC

## 2016-05-21 MED ORDER — POLYETHYLENE GLYCOL 3350 17 GM/SCOOP PO POWD: ORAL | 0 refills | Status: DC

## 2016-05-21 MED ORDER — METRONIDAZOLE 500 MG PO TABS: ORAL_TABLET | ORAL | 0 refills | Status: DC

## 2016-05-21 NOTE — Progress Notes (Signed)
Patient ID: Alexis Haas, female   DOB: 1917/12/14, 83 y.o.   MRN: [id number]  Chief Complaint  Patient presents with  . Colon Cancer    HPI Alexis Haas is a 83 y.o. female.  Here today for evaluation of colon cancer and anemia referred by Dr Mike Gip at [facility]. Endoscopy and colonoscopy with biopsy was 04-15-16 by Dr Allen Norris was done for anemia and rectal bleeding. She states she has lost only a few pounds. Appetite is "too good".  She still lives at home alone and retired last month from [place].  PET scan was 05-19-16. She occasionally uses a walker. She is here today with her son, Alexis Haas and daughter Alexis Haas.  HPI  Past Medical History:  Diagnosis Date  . Anemia   . Arthritis   . Cancer ([place]) 04/15/2016   ADENOCARCINOMA. transverse colon  . Fall 2018  . GERD (gastroesophageal reflux disease)   . Heart disease   . Hypercholesteremia   . Hypertension   . Insomnia     Past Surgical History:  Procedure Laterality Date  . CARDIAC SURGERY  2005   bypass  . COLONOSCOPY WITH PROPOFOL N/A 04/15/2016   Procedure: COLONOSCOPY WITH PROPOFOL;  Surgeon: Lucilla Lame, MD;  Location: [facility] ENDOSCOPY;  Service: Endoscopy;  Laterality: N/A;  . ESOPHAGOGASTRODUODENOSCOPY (EGD) WITH PROPOFOL N/A 04/15/2016   Procedure: ESOPHAGOGASTRODUODENOSCOPY (EGD) WITH PROPOFOL;  Surgeon: Lucilla Lame, MD;  Location: [facility] ENDOSCOPY;  Service: Endoscopy;  Laterality: N/A;  . toe removal    . TONSILLECTOMY     age 44    History reviewed. No pertinent family history.  Social History Social History  Substance Use Topics  . Smoking status: Never Smoker  . Smokeless tobacco: Never Used  . Alcohol use No    Allergies  Allergen Reactions  . Amoxicillin Rash    Current Outpatient Prescriptions  Medication Sig Dispense Refill  . aspirin 81 MG tablet Take 81 mg by mouth daily.    . hydrochlorothiazide (HYDRODIURIL) 25 MG tablet Take 25 mg by mouth daily.    Marland Kitchen lovastatin (MEVACOR) 40 MG  tablet Take 40 mg by mouth at bedtime.    . metoprolol tartrate (LOPRESSOR) 25 MG tablet Take 25 mg by mouth 2 (two) times daily.    . naproxen sodium (ANAPROX) 220 MG tablet Take 220 mg by mouth daily as needed.    Marland Kitchen omeprazole (PRILOSEC) 20 MG capsule Take 20 mg by mouth daily.    Marland Kitchen sulfamethoxazole-trimethoprim (BACTRIM DS,SEPTRA DS) 800-160 MG tablet Take 1 tablet by mouth 2 (two) times daily. 10 tablet 0  . metroNIDAZOLE (FLAGYL) 500 MG tablet Take one (1) tablet at 6 pm and one (1) tablet at 11 pm the evening prior to surgery. 2 tablet 0  . neomycin (MYCIFRADIN) 500 MG tablet Take two (2) tablets at 6 pm and two (2) tablets at 11 pm the evening prior to surgery. 4 tablet 0  . polyethylene glycol powder (GLYCOLAX/MIRALAX) powder 255 grams one bottle for colonoscopy prep 255 g 0   No current facility-administered medications for this visit.     Review of Systems Review of Systems  Constitutional: Negative.   Respiratory: Negative.   Cardiovascular: Negative.   Gastrointestinal: Positive for constipation.    Blood pressure (!) 148/66, pulse (!) 56, resp. rate 14, height 5' 3.5" (1.613 m), weight 171 lb (77.6 kg).  Physical Exam Physical Exam  Constitutional: She is oriented to person, place, and time. She appears well-developed and well-nourished.  HENT:  Mouth/Throat: Oropharynx is clear and moist.  Eyes: Conjunctivae are normal. No scleral icterus.  Neck: Neck supple.  Cardiovascular: Normal rate, regular rhythm and normal heart sounds.   Pulses:      Dorsalis pedis pulses are 2+ on the right side, and 2+ on the left side.       Posterior tibial pulses are 2+ on the right side, and 2+ on the left side.  1+ bilateral lower leg edema  Pulmonary/Chest: Effort normal and breath sounds normal.  Abdominal: Soft. Normal appearance and bowel sounds are normal. There is no tenderness. A hernia is present.  Small umbilical hernia  Lymphadenopathy:    She has no cervical adenopathy.   Neurological: She is alert and oriented to person, place, and time.  Skin: Skin is warm and dry.  Psychiatric: Her behavior is normal.    Data Reviewed Upper endoscopy and colonoscopy of 04/15/2016 reviewed.  PET/CT of 05/19/2016 reviewed. Markedly hypermetabolic lesion in the distal ascending colon and the proximal hepatic flexure. Possible 9 mm foci of metastatic disease in the right lobe of the liver. Large hiatal hernia.  CEA obtained on 05/07/2016 was elevated at 6.4. Comprehensive metabolic panel the same date showed a creatinine of 1.5 with an estimated GFR of 28. Mild elevation of serum glucose, nonfasting 118. Serum albumin 3.8. Normal liver function studies. CBC of the same date showed a hemoglobin of 10.7 with an MCV of 81, white blood cell count 9600, platelet, 326,000. Stool Hemoccult cards negative times November 2016 through September 2017.  01/02/2015 CBC showed a hemoglobin of 7.7 with an MCV of 66.  Assessment    Near obstructing (by endoscopy) tumor of the hepatic flexure.  History gastric ulcer without evidence of bleeding.  Long-standing anemia.  Excellent functional status.     Plan    The indications for colon resection were reviewed. While the patient is a nonagenarian, she is still managing her own home. She is able to do her grocery shopping, and reports no episodes of shortness of breath or chest pain since her coronary bypass 15 years ago.  The patient reports lifelong constipation, and has not noticed any abdominal pain. The high-grade stenosis identified at the time of her colonoscopy puts her at risk for presenting with acute colonic obstruction. This would certainly put her at greater risk for surgical intervention.  The patient's son and daughter were present for the review at this time plans are to proceed with surgical resection.     The case was discussed with Marchia Bond, M.D. From anesthesia. Formal cardiology consultation is not  required unless the patient's primary care physician deems this necessary.  HPI, Physical Exam, Assessment and Plan have been scribed under the direction and in the presence of Robert Bellow, MD.  Karie Fetch, RN I have completed the exam and reviewed the above documentation for accuracy and completeness.  I agree with the above.  Haematologist has been used and any errors in dictation or transcription are unintentional.  Hervey Ard, M.D., F.A.C.S.   Robert Bellow 05/22/2016, 7:26 AM  Patient's primary care physician will need to see patient for medical clearance prior to surgery. An appointment has been scheduled with Dr. Clemmie Krill for 05-23-16 at 10 am.  This patient's surgery has been scheduled for 06-05-16 at [facility]. She will be asked to complete a bowel prep with antibiotics day prior to surgery. Prescriptions have been sent in to the patient's pharmacy today. It is okay for patient  to continue an 81 mg aspirin once daily.   Dominga Ferry, CMA

## 2016-05-21 NOTE — Progress Notes (Signed)
[facility] day:  05/21/2016  Chief Complaint: Alexis Haas is a 83 y.o. female with adenocarcinoma of the cancer and associated iron deficiency anemia who is seen for review of PET scan and discussion regarding direction of therapy.  HPI:  The patient was last seen in the hematology clinic on 05/07/2016.  At that time, she denied any GI complaints.  Exam was unremarkable.  Hematocrit was 32.5 with a hemoglobin of 10.7.  Ferritin was 12.  She returned for [facility] on  05/09/2016.  At lat visit, the results of her colonoscopy were reviewed.  She was diagnosed with adenocarcinoma of the transverse colon.  PET scan was scheduled to avoid IV contrast dye.  We discussed referral to surgery.   She was hesistant about surgery given her age and associated risk  PET scan on 05/19/2016 revealed markedly hypermetabolic lesion in the distal ascending colon extending into the proximal hepatic flexure, consistent with the known primary malignancy.  There was a tiny focus of FDG uptake in the medial right liver, just minimally above the background expected mottled hepatic uptake. CT imaging suggests there may be a tiny 9 mm hypoattenuating focus at this location. While not definite, imaging features to raise concern for the possibility of metastatic disease in the liver  She has an appointment today with Dr. Bary Castilla to discuss surgery.  Symptomatically, she is "doing alright".  She notes come trouble with constipation.  She denies any melena or hematochezia.   Past Medical History:  Diagnosis Date  . Anemia   . Arthritis   . GERD (gastroesophageal reflux disease)   . Heart disease   . Hypercholesteremia   . Hypertension   . Insomnia     Past Surgical History:  Procedure Laterality Date  . CARDIAC SURGERY  2005  . COLONOSCOPY WITH PROPOFOL N/A 04/15/2016   Procedure: COLONOSCOPY WITH PROPOFOL;  Surgeon: Lucilla Lame, MD;  Location: [facility] ENDOSCOPY;   Service: Endoscopy;  Laterality: N/A;  . ESOPHAGOGASTRODUODENOSCOPY (EGD) WITH PROPOFOL N/A 04/15/2016   Procedure: ESOPHAGOGASTRODUODENOSCOPY (EGD) WITH PROPOFOL;  Surgeon: Lucilla Lame, MD;  Location: [facility] ENDOSCOPY;  Service: Endoscopy;  Laterality: N/A;  . toe removal    . TONSILLECTOMY      History reviewed. No pertinent family history.  Social History:  reports that she has never smoked. She has never used smokeless tobacco. She reports that she does not drink alcohol or use drugs.  She states that she has worked her whole life (since the age of 11).  She works at [employer] part time.  She is head of the shoe department.  She lives in [place].  She lives alone.  Her husband died 70 years ago.  Her daughter is Theodora Blow (home: [phone]; work: [phone]).  The patient is accompanied by her daughter  And oldest son, Chrissie Noa, today.  Allergies:  Allergies  Allergen Reactions  . Amoxicillin Rash    Current Medications: Current Outpatient Prescriptions  Medication Sig Dispense Refill  . aspirin 81 MG tablet Take 81 mg by mouth daily.    . hydrochlorothiazide (HYDRODIURIL) 25 MG tablet Take 25 mg by mouth daily.    Marland Kitchen lovastatin (MEVACOR) 40 MG tablet Take 40 mg by mouth at bedtime.    . metoprolol tartrate (LOPRESSOR) 25 MG tablet Take 25 mg by mouth 2 (two) times daily.    . naproxen sodium (ANAPROX) 220 MG tablet Take 220 mg by mouth daily as needed.    Marland Kitchen  omeprazole (PRILOSEC) 20 MG capsule Take 20 mg by mouth daily.    Marland Kitchen sulfamethoxazole-trimethoprim (BACTRIM DS,SEPTRA DS) 800-160 MG tablet Take 1 tablet by mouth 2 (two) times daily. (Patient not taking: Reported on 03/05/2016) 10 tablet 0   No current facility-administered medications for this visit.     Review of Systems:  GENERAL:  Feels "alright".  No fevers or sweats.  Weight up 1 pound. PERFORMANCE STATUS (ECOG):  1 HEENT:  No visual changes, sore throat, mouth sores or tenderness. Lungs: No shortness of  breath or cough.  No hemoptysis. Cardiac:  No chest pain, palpitations, orthopnea, or PND. GI:  Constipation.  No nausea, vomiting, diarrhea, melena or hematochezia. GU:  No urgency, frequency, dysuria, or hematuria. Musculoskeletal:  No back pain.  Arthritis in left knee (wears brace).  Can't lift right arm fully up secondary to arthritis.  No muscle tenderness. Extremities:  No pain or swelling. Skin:  No rashes or skin changes. Neuro:  Forgetful.  No headache, numbness or weakness, balance or coordination issues. Endocrine:  No diabetes, thyroid issues, hot flashes or night sweats. Psych:  No mood changes, depression or anxiety. Pain:  No focal pain. Review of systems:  All other systems reviewed and found to be negative.  Physical Exam: Blood pressure 129/74, pulse (!) 50, temperature (!) 96.4 F (35.8 C), temperature source Tympanic, resp. rate 18, weight 169 lb 1.5 oz (76.7 kg). GENERAL:  Elderly woman sitting comfortably in the exam room in no acute distress.  She has a rolling walker at her side. MENTAL STATUS:  Alert and oriented to person, place and time. HEAD:  Curly gray hair. Normocephalic, face symmetric, no Cushingoid features. EYES:  Glasses.  Blue eyes.  No conjunctivitis or scleral icterus. EXTREMITIES: Arthritic changes in hands.  Left knee brace.  Chronic 1+ bilateral ankle edema (left > right).  NEUROLOGICAL: Unremarkable. PSYCH:  Appropriate.   Hospital Outpatient Visit on 05/19/2016  Component Date Value Ref Range Status  . Glucose-Capillary 05/19/2016 83  65 - 99 mg/dL Final    Assessment:  Alexis Haas is a 83 y.o. female with adenocarcinoma of the transverse colon.  Colonoscopy on 04/15/2016 revealed severe stenosis in the transverse colon.  Biopsies confirmed adenocarcinoma.  PET scan on 05/19/2016 revealed markedly hypermetabolic lesion in the distal ascending colon extending into the proximal hepatic flexure, consistent with the known primary  malignancy.  There was a tiny focus of FDG uptake in the medial right liver, just minimally above the background expected mottled hepatic uptake. CT imaging suggests there may be a tiny 9 mm hypoattenuating focus at this location. While not definite, imaging features to raise concern for the possibility of metastatic disease in the liver.  CEA was 6.4 on 05/07/2016.  She presented with chronic iron deficiency anemia secondary to blood loss.  Invasive procedures (EGD or colonoscopy) were initially deferred secondary to her age.  Guaiac cards were negative (12/2014 and 10/2015) then positive (01/2016).  Diet is modest.  EGD on 04/15/2016 revealed a medium size hiatal hernia, nonbleeding gastric ulcer with no stigmata of bleeding.  Oral iron is ineffective.  She received 2 units PRBCs on 10/11/2015 for a hematocrit of 21.1 and hemoglobin 6.4.  She has received Feraheme on several occasions:  01/11/2015, 02/01/2015, 03/15/2015, 03/22/2015, 10/16/2015, 01/09/2016, 03/05/2016, and on 05/09/2016.    Ferritin has been monitored: 5 on 01/02/2015, 36 on 01/31/2105, 26 on 03/08/2015, 184 on 05/02/2015, 7 on 10/10/2015, 91 on 11/07/2015, 7 on 01/02/2016, 14 on 03/03/2016,  33 on 04/07/2016, and 12 on 05/07/2016.  Symptomatically, she notes constipation.  She denies any melena or hematochezia.  Exam is unremarkable.   Plan: 1.  Discuss results of PET scan.  Tumor is near the hepatic flexure.  Significance of liver lesion unclear.  Discuss constipation and risk of obstruction.  Discuss follow-up with Dr. Bary Castilla today to discuss surgery.  Briefly discussed staging of colon cancer and general treatment.  Suspect she will do well with surgery given her general health.  Discuss plans to follow-up in clinic after surgery. 2.  Patient has appt with Dr. Bary Castilla today 3.  Patient to call for follow-up after surgery   Lequita Asal, MD  05/21/2016, 11:23 AM

## 2016-05-21 NOTE — Progress Notes (Signed)
Patient here today to follow up with plan regarding colon cancer.

## 2016-05-21 NOTE — Patient Instructions (Signed)
The patient is aware to call back for any questions or concerns.  

## 2016-05-23 DIAGNOSIS — C183 Malignant neoplasm of hepatic flexure: Secondary | ICD-10-CM | POA: Diagnosis not present

## 2016-05-23 DIAGNOSIS — Z6831 Body mass index (BMI) 31.0-31.9, adult: Secondary | ICD-10-CM | POA: Diagnosis not present

## 2016-05-26 ENCOUNTER — Telehealth: Payer: Self-pay | Admitting: *Deleted

## 2016-05-26 NOTE — Telephone Encounter (Signed)
Per Caryl Pina with Pre-admission Testing, they received medical clearance from Dr. Clemmie Krill on this patient.

## 2016-05-27 ENCOUNTER — Encounter
Admission: RE | Admit: 2016-05-27 | Discharge: 2016-05-27 | Disposition: A | Discharge status: Home / Self Care | Payer: PPO | Source: Ambulatory Visit | Attending: General Surgery | Admitting: General Surgery

## 2016-05-27 DIAGNOSIS — Z880 Allergy status to penicillin: Secondary | ICD-10-CM | POA: Insufficient documentation

## 2016-05-27 DIAGNOSIS — K219 Gastro-esophageal reflux disease without esophagitis: Secondary | ICD-10-CM | POA: Insufficient documentation

## 2016-05-27 DIAGNOSIS — E7800 Pure hypercholesterolemia, unspecified: Secondary | ICD-10-CM | POA: Diagnosis not present

## 2016-05-27 DIAGNOSIS — Z01818 Encounter for other preprocedural examination: Secondary | ICD-10-CM | POA: Insufficient documentation

## 2016-05-27 DIAGNOSIS — C189 Malignant neoplasm of colon, unspecified: Secondary | ICD-10-CM | POA: Diagnosis not present

## 2016-05-27 DIAGNOSIS — Z7982 Long term (current) use of aspirin: Secondary | ICD-10-CM | POA: Insufficient documentation

## 2016-05-27 DIAGNOSIS — D649 Anemia, unspecified: Secondary | ICD-10-CM | POA: Insufficient documentation

## 2016-05-27 DIAGNOSIS — Z79899 Other long term (current) drug therapy: Secondary | ICD-10-CM | POA: Diagnosis not present

## 2016-05-27 DIAGNOSIS — G4700 Insomnia, unspecified: Secondary | ICD-10-CM | POA: Insufficient documentation

## 2016-05-27 DIAGNOSIS — I10 Essential (primary) hypertension: Secondary | ICD-10-CM | POA: Insufficient documentation

## 2016-05-27 DIAGNOSIS — Z9889 Other specified postprocedural states: Secondary | ICD-10-CM | POA: Insufficient documentation

## 2016-05-27 HISTORY — DX: Atherosclerotic heart disease of native coronary artery without angina pectoris: I25.10

## 2016-05-27 HISTORY — DX: Chronic kidney disease, unspecified: N18.9

## 2016-05-27 LAB — SURGICAL PCR SCREEN
MRSA, PCR: NEGATIVE
Staphylococcus aureus: NEGATIVE

## 2016-05-27 NOTE — Patient Instructions (Signed)
  Your procedure is scheduled [id number] June 05, 2016. Report to Same Day Surgery. To find out your arrival time please call [phone] between 1PM - 3PM on Wednesday June 04, 2016.  Remember: Instructions that are not followed completely may result in serious medical risk, up to and including death, or upon the discretion of your surgeon and anesthesiologist your surgery may need to be rescheduled.    _x___ 1. Do not eat food or drink liquids after midnight. No gum chewing or hard candies.     ____ 2. No Alcohol for 24 hours before or after surgery.   ____ 3. Bring all medications with you on the day of surgery if instructed.    __x__ 4. Notify your doctor if there is any change in your medical condition     (cold, fever, infections).    _____ 5. No smoking 24 hours prior to surgery.     Do not wear jewelry, make-up, hairpins, clips or nail polish.  Do not wear lotions, powders, or perfumes.   Do not shave 48 hours prior to surgery. Men may shave face and neck.  Do not bring valuables to the hospital.    [facility] is not responsible for any belongings or valuables.               Contacts, dentures or bridgework may not be worn into surgery.  Leave your suitcase in the car. After surgery it may be brought to your room.  For patients admitted to the hospital, discharge time is determined by your treatment team.   Patients discharged the day of surgery will not be allowed to drive home.    Please read over the following fact sheets that you were given:   [facility] Preparing for Surgery  __x__ Take these medicines the morning of surgery with A SIP OF WATER:    1. metoprolol tartrate (LOPRESSOR)  2. omeprazole (PRILOSEC)    ____ Fleet Enema (as directed)   _x___ Use SAGE wipe as directed on instruction sheet  ____ Use inhalers on the day of surgery and bring to hospital day of surgery  ____ Stop metformin 2 days prior to surgery    ____ Take 1/2 of usual insulin  dose the night before surgery and none on the morning of surgery.   ____ Stop Coumadin/Plavix/aspirin on does not apply. Continue aspirin per Dr. Dwyane Luo instructions.  _x___ Stop Anti-inflammatories such as Advil, Aleve, Ibuprofen, Motrin, Naproxen, Naprosyn, Goodies powders or aspirin products. OK to take Tylenol.   ____ Stop supplements until after surgery.    ____ Bring C-Pap to the hospital.

## 2016-06-04 MED ORDER — ERTAPENEM SODIUM 1 G IJ SOLR
500.0000 mg | INTRAMUSCULAR | Status: AC
  Administered 2016-06-05: 0.5 g via INTRAVENOUS
  Filled 2016-06-04: qty 0.5

## 2016-06-04 MED ORDER — ALVIMOPAN 12 MG PO CAPS
12.0000 mg | ORAL_CAPSULE | Freq: Once | ORAL | Status: AC
  Administered 2016-06-05: 12 mg via ORAL

## 2016-06-05 ENCOUNTER — Inpatient Hospital Stay
Admission: RE | Admit: 2016-06-05 | Discharge: 2016-06-09 | DRG: 330 | Disposition: A | Discharge status: Home Health | Payer: PPO | Source: Ambulatory Visit | Attending: General Surgery | Admitting: General Surgery

## 2016-06-05 ENCOUNTER — Encounter
Admission: RE | Disposition: A | Discharge status: Home Health | Payer: Self-pay | Source: Ambulatory Visit | Attending: General Surgery

## 2016-06-05 ENCOUNTER — Encounter: Payer: Self-pay | Admitting: *Deleted

## 2016-06-05 ENCOUNTER — Inpatient Hospital Stay: Payer: PPO | Admitting: Anesthesiology

## 2016-06-05 DIAGNOSIS — Z791 Long term (current) use of non-steroidal anti-inflammatories (NSAID): Secondary | ICD-10-CM

## 2016-06-05 DIAGNOSIS — K279 Peptic ulcer, site unspecified, unspecified as acute or chronic, without hemorrhage or perforation: Secondary | ICD-10-CM | POA: Diagnosis present

## 2016-06-05 DIAGNOSIS — Z951 Presence of aortocoronary bypass graft: Secondary | ICD-10-CM | POA: Diagnosis not present

## 2016-06-05 DIAGNOSIS — Z79899 Other long term (current) drug therapy: Secondary | ICD-10-CM | POA: Diagnosis not present

## 2016-06-05 DIAGNOSIS — Z7982 Long term (current) use of aspirin: Secondary | ICD-10-CM

## 2016-06-05 DIAGNOSIS — Z792 Long term (current) use of antibiotics: Secondary | ICD-10-CM | POA: Diagnosis not present

## 2016-06-05 DIAGNOSIS — M1990 Unspecified osteoarthritis, unspecified site: Secondary | ICD-10-CM | POA: Diagnosis not present

## 2016-06-05 DIAGNOSIS — C183 Malignant neoplasm of hepatic flexure: Secondary | ICD-10-CM | POA: Diagnosis not present

## 2016-06-05 DIAGNOSIS — C182 Malignant neoplasm of ascending colon: Secondary | ICD-10-CM | POA: Diagnosis not present

## 2016-06-05 DIAGNOSIS — N183 Chronic kidney disease, stage 3 (moderate): Secondary | ICD-10-CM | POA: Diagnosis not present

## 2016-06-05 DIAGNOSIS — C188 Malignant neoplasm of overlapping sites of colon: Secondary | ICD-10-CM | POA: Diagnosis not present

## 2016-06-05 DIAGNOSIS — D509 Iron deficiency anemia, unspecified: Secondary | ICD-10-CM | POA: Diagnosis present

## 2016-06-05 DIAGNOSIS — I10 Essential (primary) hypertension: Secondary | ICD-10-CM | POA: Diagnosis not present

## 2016-06-05 DIAGNOSIS — E7800 Pure hypercholesterolemia, unspecified: Secondary | ICD-10-CM | POA: Diagnosis not present

## 2016-06-05 DIAGNOSIS — Z23 Encounter for immunization: Secondary | ICD-10-CM

## 2016-06-05 DIAGNOSIS — I129 Hypertensive chronic kidney disease with stage 1 through stage 4 chronic kidney disease, or unspecified chronic kidney disease: Secondary | ICD-10-CM | POA: Diagnosis present

## 2016-06-05 DIAGNOSIS — K219 Gastro-esophageal reflux disease without esophagitis: Secondary | ICD-10-CM | POA: Diagnosis not present

## 2016-06-05 DIAGNOSIS — I2510 Atherosclerotic heart disease of native coronary artery without angina pectoris: Secondary | ICD-10-CM | POA: Diagnosis present

## 2016-06-05 DIAGNOSIS — C189 Malignant neoplasm of colon, unspecified: Secondary | ICD-10-CM | POA: Diagnosis not present

## 2016-06-05 DIAGNOSIS — K429 Umbilical hernia without obstruction or gangrene: Secondary | ICD-10-CM | POA: Diagnosis present

## 2016-06-05 DIAGNOSIS — N189 Chronic kidney disease, unspecified: Secondary | ICD-10-CM | POA: Diagnosis not present

## 2016-06-05 DIAGNOSIS — Z880 Allergy status to penicillin: Secondary | ICD-10-CM | POA: Diagnosis not present

## 2016-06-05 DIAGNOSIS — K259 Gastric ulcer, unspecified as acute or chronic, without hemorrhage or perforation: Secondary | ICD-10-CM | POA: Diagnosis not present

## 2016-06-05 HISTORY — PX: LAPAROSCOPIC RIGHT COLECTOMY: SHX5925

## 2016-06-05 HISTORY — PX: UMBILICAL HERNIA REPAIR: SHX196

## 2016-06-05 LAB — COMPREHENSIVE METABOLIC PANEL
ALT: 13 U/L — AB (ref 14–54)
AST: 20 U/L (ref 15–41)
Albumin: 3.4 g/dL — ABNORMAL LOW (ref 3.5–5.0)
Alkaline Phosphatase: 47 U/L (ref 38–126)
Anion gap: 13 (ref 5–15)
BUN: 26 mg/dL — ABNORMAL HIGH (ref 6–20)
CHLORIDE: 103 mmol/L (ref 101–111)
CO2: 19 mmol/L — AB (ref 22–32)
CREATININE: 1.19 mg/dL — AB (ref 0.44–1.00)
Calcium: 8.3 mg/dL — ABNORMAL LOW (ref 8.9–10.3)
GFR calc non Af Amer: 37 mL/min — ABNORMAL LOW (ref >60)
GFR, EST AFRICAN AMERICAN: 43 mL/min — AB (ref >60)
Glucose, Bld: 166 mg/dL — ABNORMAL HIGH (ref 65–99)
POTASSIUM: 3.6 mmol/L (ref 3.5–5.1)
SODIUM: 135 mmol/L (ref 135–145)
Total Bilirubin: 0.6 mg/dL (ref 0.3–1.2)
Total Protein: 6.1 g/dL — ABNORMAL LOW (ref 6.5–8.1)

## 2016-06-05 SURGERY — COLECTOMY, RIGHT, LAPAROSCOPIC
Anesthesia: General | Laterality: Right | Wound class: Clean Contaminated

## 2016-06-05 MED ORDER — LACTATED RINGERS IV SOLN
INTRAVENOUS | Status: DC | PRN
  Administered 2016-06-05: 09:00:00 via INTRAVENOUS

## 2016-06-05 MED ORDER — FENTANYL CITRATE (PF) 100 MCG/2ML IJ SOLN: 25.0000 ug | INTRAMUSCULAR | Status: DC | PRN

## 2016-06-05 MED ORDER — ACETAMINOPHEN 10 MG/ML IV SOLN
INTRAVENOUS | Status: AC
  Filled 2016-06-05: qty 100

## 2016-06-05 MED ORDER — ROCURONIUM BROMIDE 100 MG/10ML IV SOLN
INTRAVENOUS | Status: DC | PRN
  Administered 2016-06-05: 40 mg via INTRAVENOUS
  Administered 2016-06-05: 10 mg via INTRAVENOUS

## 2016-06-05 MED ORDER — BUPIVACAINE-EPINEPHRINE (PF) 0.5% -1:200000 IJ SOLN
INTRAMUSCULAR | Status: AC
  Filled 2016-06-05: qty 30

## 2016-06-05 MED ORDER — PNEUMOCOCCAL VAC POLYVALENT 25 MCG/0.5ML IJ INJ
0.5000 mL | INJECTION | INTRAMUSCULAR | Status: AC
  Administered 2016-06-06: 0.5 mL via INTRAMUSCULAR
  Filled 2016-06-05: qty 0.5

## 2016-06-05 MED ORDER — ONDANSETRON HCL 4 MG/2ML IJ SOLN: 4.0000 mg | Freq: Four times a day (QID) | INTRAMUSCULAR | Status: DC | PRN

## 2016-06-05 MED ORDER — ACETAMINOPHEN 10 MG/ML IV SOLN
INTRAVENOUS | Status: DC | PRN
  Administered 2016-06-05: 1000 mg via INTRAVENOUS

## 2016-06-05 MED ORDER — BUPIVACAINE-EPINEPHRINE (PF) 0.5% -1:200000 IJ SOLN
INTRAMUSCULAR | Status: DC | PRN
  Administered 2016-06-05: 30 mL

## 2016-06-05 MED ORDER — ENOXAPARIN SODIUM 30 MG/0.3ML ~~LOC~~ SOLN
30.0000 mg | SUBCUTANEOUS | Status: DC
  Administered 2016-06-06 – 2016-06-09 (×4): 30 mg via SUBCUTANEOUS
  Filled 2016-06-05 (×4): qty 0.3

## 2016-06-05 MED ORDER — LIDOCAINE HCL (CARDIAC) 20 MG/ML IV SOLN
INTRAVENOUS | Status: DC | PRN
Start: 2016-06-05 — End: 2016-06-05
  Administered 2016-06-05: 80 mg via INTRAVENOUS

## 2016-06-05 MED ORDER — EPHEDRINE SULFATE 50 MG/ML IJ SOLN
INTRAMUSCULAR | Status: DC | PRN
  Administered 2016-06-05: 10 mg via INTRAVENOUS
  Administered 2016-06-05: 5 mg via INTRAVENOUS

## 2016-06-05 MED ORDER — SEVOFLURANE IN SOLN
RESPIRATORY_TRACT | Status: AC
  Filled 2016-06-05: qty 250

## 2016-06-05 MED ORDER — ASPIRIN EC 81 MG PO TBEC
81.0000 mg | DELAYED_RELEASE_TABLET | Freq: Every day | ORAL | Status: DC
  Administered 2016-06-07 – 2016-06-09 (×3): 81 mg via ORAL
  Filled 2016-06-05 (×4): qty 1

## 2016-06-05 MED ORDER — ALVIMOPAN 12 MG PO CAPS
ORAL_CAPSULE | ORAL | Status: AC
  Administered 2016-06-05: 07:00:00
  Filled 2016-06-05: qty 1

## 2016-06-05 MED ORDER — SODIUM CHLORIDE 0.9 % IV SOLN: INTRAVENOUS | Status: DC

## 2016-06-05 MED ORDER — HYDROCODONE-ACETAMINOPHEN 5-325 MG PO TABS
1.0000 | ORAL_TABLET | ORAL | Status: DC | PRN
  Administered 2016-06-05: 1 via ORAL
  Filled 2016-06-05: qty 1

## 2016-06-05 MED ORDER — ONDANSETRON HCL 4 MG/2ML IJ SOLN
INTRAMUSCULAR | Status: DC | PRN
  Administered 2016-06-05: 4 mg via INTRAVENOUS

## 2016-06-05 MED ORDER — METOPROLOL TARTRATE 25 MG PO TABS: 25.0000 mg | ORAL_TABLET | Freq: Two times a day (BID) | ORAL | Status: DC

## 2016-06-05 MED ORDER — LIDOCAINE HCL (PF) 2 % IJ SOLN
INTRAMUSCULAR | Status: AC
  Filled 2016-06-05: qty 2

## 2016-06-05 MED ORDER — DEXTROSE IN LACTATED RINGERS 5 % IV SOLN
INTRAVENOUS | Status: DC
  Administered 2016-06-05 – 2016-06-08 (×5): via INTRAVENOUS

## 2016-06-05 MED ORDER — ONDANSETRON HCL 4 MG/2ML IJ SOLN
INTRAMUSCULAR | Status: AC
Start: 2016-06-05 — End: 2016-06-05
  Filled 2016-06-05: qty 2

## 2016-06-05 MED ORDER — FENTANYL CITRATE (PF) 100 MCG/2ML IJ SOLN
INTRAMUSCULAR | Status: DC | PRN
  Administered 2016-06-05 (×2): 50 ug via INTRAVENOUS

## 2016-06-05 MED ORDER — FENTANYL CITRATE (PF) 100 MCG/2ML IJ SOLN
INTRAMUSCULAR | Status: AC
  Filled 2016-06-05: qty 2

## 2016-06-05 MED ORDER — GLYCOPYRROLATE 0.2 MG/ML IJ SOLN
INTRAMUSCULAR | Status: DC | PRN
  Administered 2016-06-05: .8 mg via INTRAVENOUS
  Administered 2016-06-05: 0.2 mg via INTRAVENOUS

## 2016-06-05 MED ORDER — ALVIMOPAN 12 MG PO CAPS
12.0000 mg | ORAL_CAPSULE | Freq: Two times a day (BID) | ORAL | Status: DC
  Administered 2016-06-05 – 2016-06-08 (×7): 12 mg via ORAL
  Filled 2016-06-05 (×8): qty 1

## 2016-06-05 MED ORDER — ROCURONIUM BROMIDE 50 MG/5ML IV SOLN
INTRAVENOUS | Status: AC
  Filled 2016-06-05: qty 1

## 2016-06-05 MED ORDER — PRAVASTATIN SODIUM 40 MG PO TABS
40.0000 mg | ORAL_TABLET | Freq: Every day | ORAL | Status: DC
Start: 2016-06-05 — End: 2016-06-09
  Administered 2016-06-05 – 2016-06-08 (×4): 40 mg via ORAL
  Filled 2016-06-05 (×4): qty 1

## 2016-06-05 MED ORDER — ONDANSETRON 4 MG PO TBDP: 4.0000 mg | ORAL_TABLET | Freq: Four times a day (QID) | ORAL | Status: DC | PRN

## 2016-06-05 MED ORDER — DEXAMETHASONE SODIUM PHOSPHATE 10 MG/ML IJ SOLN
INTRAMUSCULAR | Status: DC | PRN
  Administered 2016-06-05: 5 mg via INTRAVENOUS

## 2016-06-05 MED ORDER — PANTOPRAZOLE SODIUM 40 MG PO TBEC
40.0000 mg | DELAYED_RELEASE_TABLET | Freq: Every day | ORAL | Status: DC
  Administered 2016-06-06 – 2016-06-09 (×4): 40 mg via ORAL
  Filled 2016-06-05 (×4): qty 1

## 2016-06-05 MED ORDER — MORPHINE SULFATE (PF) 2 MG/ML IV SOLN: 2.0000 mg | INTRAVENOUS | Status: DC | PRN

## 2016-06-05 MED ORDER — PROPOFOL 10 MG/ML IV BOLUS
INTRAVENOUS | Status: DC | PRN
  Administered 2016-06-05: 100 mg via INTRAVENOUS
  Administered 2016-06-05: 20 mg via INTRAVENOUS

## 2016-06-05 MED ORDER — PROPOFOL 10 MG/ML IV BOLUS
INTRAVENOUS | Status: AC
  Filled 2016-06-05: qty 20

## 2016-06-05 MED ORDER — MEPERIDINE HCL 50 MG/ML IJ SOLN: 6.2500 mg | INTRAMUSCULAR | Status: DC | PRN

## 2016-06-05 MED ORDER — ACETAMINOPHEN 10 MG/ML IV SOLN
1000.0000 mg | Freq: Four times a day (QID) | INTRAVENOUS | Status: AC
  Administered 2016-06-05 – 2016-06-06 (×4): 1000 mg via INTRAVENOUS
  Filled 2016-06-05 (×4): qty 100

## 2016-06-05 MED ORDER — DEXAMETHASONE SODIUM PHOSPHATE 10 MG/ML IJ SOLN
INTRAMUSCULAR | Status: AC
Start: 2016-06-05 — End: 2016-06-05
  Filled 2016-06-05: qty 1

## 2016-06-05 MED ORDER — LACTATED RINGERS IV SOLN
INTRAVENOUS | Status: DC
  Administered 2016-06-05: 08:00:00 via INTRAVENOUS

## 2016-06-05 MED ORDER — NEOSTIGMINE METHYLSULFATE 10 MG/10ML IV SOLN
INTRAVENOUS | Status: DC | PRN
  Administered 2016-06-05: 5 mg via INTRAVENOUS

## 2016-06-05 MED ORDER — PROMETHAZINE HCL 25 MG/ML IJ SOLN: 6.2500 mg | INTRAMUSCULAR | Status: DC | PRN

## 2016-06-05 SURGICAL SUPPLY — 75 items
APPLIER CLIP ROT 10 11.4 M/L (STAPLE)
BLADE SURG 10 STRL SS SAFETY (BLADE) ×4 IMPLANT
BLADE SURG 11 STRL SS SAFETY (MISCELLANEOUS) ×4 IMPLANT
CANISTER SUCT 1200ML W/VALVE (MISCELLANEOUS) ×4 IMPLANT
CANNULA DILATOR 10 W/SLV (CANNULA) ×3 IMPLANT
CANNULA DILATOR 10MM W/SLV (CANNULA) ×1
CATH TRAY 16F METER LATEX (MISCELLANEOUS) ×4 IMPLANT
CHLORAPREP W/TINT 26ML (MISCELLANEOUS) ×4 IMPLANT
CLIP APPLIE ROT 10 11.4 M/L (STAPLE) IMPLANT
CLOSURE WOUND 1/2 X4 (GAUZE/BANDAGES/DRESSINGS) ×1
COVER CLAMP SIL LG PBX B (MISCELLANEOUS) IMPLANT
DEVICE HAND ACCESS DEXTUS (MISCELLANEOUS) IMPLANT
DRAPE LAP W/FLUID (DRAPES) IMPLANT
DRAPE UNDER BUTTOCK W/FLU (DRAPES) IMPLANT
DRSG OPSITE POSTOP 4X10 (GAUZE/BANDAGES/DRESSINGS) IMPLANT
DRSG OPSITE POSTOP 4X8 (GAUZE/BANDAGES/DRESSINGS) IMPLANT
DRSG TEGADERM 2-3/8X2-3/4 SM (GAUZE/BANDAGES/DRESSINGS) ×12 IMPLANT
DRSG TEGADERM 4X4.75 (GAUZE/BANDAGES/DRESSINGS) ×8 IMPLANT
DRSG TELFA 3X8 NADH (GAUZE/BANDAGES/DRESSINGS) ×4 IMPLANT
ELECT BLADE 6.5 EXT (BLADE) ×4 IMPLANT
ELECT CAUTERY BLADE 6.4 (BLADE) ×4 IMPLANT
ELECT REM PT RETURN 9FT ADLT (ELECTROSURGICAL) ×4
ELECTRODE REM PT RTRN 9FT ADLT (ELECTROSURGICAL) ×2 IMPLANT
FILTER LAP SMOKE EVAC STRL (MISCELLANEOUS) ×4 IMPLANT
GLOVE BIO SURGEON STRL SZ7 (GLOVE) ×12 IMPLANT
GLOVE BIO SURGEON STRL SZ7.5 (GLOVE) ×12 IMPLANT
GLOVE INDICATOR 8.0 STRL GRN (GLOVE) ×8 IMPLANT
GOWN STRL REUS W/ TWL LRG LVL3 (GOWN DISPOSABLE) ×12 IMPLANT
GOWN STRL REUS W/TWL LRG LVL3 (GOWN DISPOSABLE) ×12
HANDLE YANKAUER SUCT BULB TIP (MISCELLANEOUS) ×4 IMPLANT
HOLDER FOLEY CATH W/STRAP (MISCELLANEOUS) ×4 IMPLANT
IRRIGATION STRYKERFLOW (MISCELLANEOUS) IMPLANT
IRRIGATOR STRYKERFLOW (MISCELLANEOUS)
IV LACTATED RINGERS 1000ML (IV SOLUTION) IMPLANT
KIT PINK PAD W/HEAD ARE REST (MISCELLANEOUS) ×4
KIT PINK PAD W/HEAD ARM REST (MISCELLANEOUS) ×2 IMPLANT
KIT RM TURNOVER STRD PROC AR (KITS) ×4 IMPLANT
LABEL OR SOLS (LABEL) ×4 IMPLANT
NDL INSUFF ACCESS 14 VERSASTEP (NEEDLE) ×4 IMPLANT
NEEDLE HYPO 25X1 1.5 SAFETY (NEEDLE) ×4 IMPLANT
NS IRRIG 500ML POUR BTL (IV SOLUTION) ×4 IMPLANT
PACK COLON CLEAN CLOSURE (MISCELLANEOUS) ×4 IMPLANT
PACK LAP CHOLECYSTECTOMY (MISCELLANEOUS) ×4 IMPLANT
PAD PREP 24X41 OB/GYN DISP (PERSONAL CARE ITEMS) IMPLANT
PENCIL ELECTRO HAND CTR (MISCELLANEOUS) ×8 IMPLANT
PROT DEXTUS HAND ACCESS (MISCELLANEOUS)
RELOAD PROXIMATE 75MM BLUE (ENDOMECHANICALS) ×4 IMPLANT
RETAINER VISCERA MED (MISCELLANEOUS) IMPLANT
RETRACTOR FIXED LENGTH SML (MISCELLANEOUS) IMPLANT
RETRACTOR WOUND ALXS 18CM MED (MISCELLANEOUS) ×2 IMPLANT
RTRCTR WOUND ALEXIS O 18CM MED (MISCELLANEOUS) ×4
SCISSORS METZENBAUM CVD 33 (INSTRUMENTS) IMPLANT
SEAL FOR SCOPE WARMER C3101 (MISCELLANEOUS) IMPLANT
SET YANKAUER POOLE SUCT (MISCELLANEOUS) ×4 IMPLANT
SHEARS HARMONIC ACE PLUS 36CM (ENDOMECHANICALS) ×4 IMPLANT
SPONGE LAP 18X18 5 PK (GAUZE/BANDAGES/DRESSINGS) ×4 IMPLANT
STAPLER PROXIMATE 75MM BLUE (STAPLE) ×4 IMPLANT
STRIP CLOSURE SKIN 1/2X4 (GAUZE/BANDAGES/DRESSINGS) ×3 IMPLANT
SUT PROLENE 0 CT 1 30 (SUTURE) ×12 IMPLANT
SUT SILK 2 0 (SUTURE)
SUT SILK 2-0 30XBRD TIE 12 (SUTURE) IMPLANT
SUT SILK 3-0 (SUTURE) ×4 IMPLANT
SUT VIC AB 2-0 BRD 54 (SUTURE) ×4 IMPLANT
SUT VIC AB 2-0 CT1 27 (SUTURE) ×4
SUT VIC AB 2-0 CT1 TAPERPNT 27 (SUTURE) ×4 IMPLANT
SUT VIC AB 3-0 54X BRD REEL (SUTURE) ×2 IMPLANT
SUT VIC AB 3-0 BRD 54 (SUTURE) ×2
SUT VIC AB 3-0 SH 27 (SUTURE) ×2
SUT VIC AB 3-0 SH 27X BRD (SUTURE) ×2 IMPLANT
SUT VIC AB 4-0 FS2 27 (SUTURE) ×8 IMPLANT
SYR CONTROL 10ML (SYRINGE) ×4 IMPLANT
TROCAR XCEL NON-BLD 11X100MML (ENDOMECHANICALS) ×4 IMPLANT
TROCAR XCEL UNIV SLVE 11M 100M (ENDOMECHANICALS) ×8 IMPLANT
TUBING INSUF HEATED (TUBING) ×4 IMPLANT
WATER STERILE IRR 1000ML POUR (IV SOLUTION) ×4 IMPLANT

## 2016-06-05 NOTE — Anesthesia Postprocedure Evaluation (Signed)
Anesthesia Post Note  Patient: YLONDA STORR  Procedure(s) Performed: Procedure(s) (LRB): LAPAROSCOPIC RIGHT COLECTOMY (Right) HERNIA REPAIR UMBILICAL ADULT  Patient location during evaluation: PACU Anesthesia Type: General Level of consciousness: awake and alert and oriented Pain management: pain level controlled Vital Signs Assessment: post-procedure vital signs reviewed and stable Respiratory status: spontaneous breathing, nonlabored ventilation and respiratory function stable Cardiovascular status: blood pressure returned to baseline and stable Postop Assessment: no signs of nausea or vomiting Anesthetic complications: no     Last Vitals:  Vitals:   06/05/16 1158 06/05/16 1205  BP: (!) 113/42 (!) 120/45  Pulse: (!) 50 (!) 56  Resp: 15 20  Temp: 36.3 C     Last Pain:  Vitals:   06/05/16 1136  TempSrc:   PainSc: Asleep                 Zade Falkner

## 2016-06-05 NOTE — Op Note (Signed)
Preoperative diagnosis: Near projecting colon cancer, hepatic flexure.  Postoperative diagnosis: Same, repair of umbilical hernia.  Operative procedure: Laparoscopically assisted right hemicolectomy with ileotransverse colostomy.  Operating surgeon: Hervey Ard, M.D.   First assistant: Arvilla Meres, RNFA  Anesthesia: Gen. endotracheal.  Estimated blood loss: 25 mL.  IV fluids: 600 mL  Urine output: 500 mL.  Clinical note: This 83 year old woman had been treated for iron deficiency anemia for about 18 months. Prior stool Hemoccults in November 2016 had been negative. She recently underwent colonoscopy with identification of a cancer in the hepatic flexure that was obstructing to retrograde scope passage. PET scan showed no evidence of metastatic disease. She was felt to be a candidate for right colectomy to minimize the risk for colonic obstruction.  The patient received formal bowel prep, received Invanz prior to this surgery and had SCD stockings for DVT prevention.  Operative note: With the patient under adequate general anesthesia the abdomen was prepped with ChloraPrep and draped after a Foley catheter was placed by the nurse. A vertical incision was made at the upper edge of the umbilicus and the 10 mm Step trocar was placed to the fascial defect. Pneumoperitoneum was established and there was no evidence of injury from initial port placement. This port was then swapped for an 11 mm XL port to prevent dislodgment. An 11 mm XL port was placed in the epigastrium under direct vision and an additional port was placed in the left upper quadrant under direct vision. The previously instilled ink was identified in the hepatic flexure. No evidence of peritoneal metastases. The surface of the liver was unremarkable. The stomach was distended and was decompressed with an orogastric tube by the nurse anesthetist.  Making use of Harmonic scalpel the omentum was freed from the right colon. This  was then swept along the white line of Toldt to free the right colon. The duodenum was visualized and protected. The entire right colon could be swept to the left of the midline. There was a little bleeding from an ovarian vessel and this was observed and no further bleeding was noted. The abdomen was desufflated and an 8 cm incision was made extending up from the umbilicus. The skin was incised sharply and the remaining tissue dissection completed with electrocautery. The Alexis wound protector was placed, and the right colon brought out through the wound. A wet lap was placed into the right paracolic gutter. The mesentery was scored and then taken down making use of the Harmonic scalpel. The right colon artery and vein as well as the right branch of the middle colic vessel were controlled with 2-0 silk ties. A side-to-side functional end-to-end anastomosis was created by bringing the distal ileum and mid transverse colon together with interrupted 3-0 silk sutures. The GIA stapler was fired and examination of the staple line showed excellent hemostasis. A transverse application of the KPT-46 was used to complete the anastomosis. The ends and crotch were reinforced with 3-0 Vicryl figure-of-eight sutures. The mesenteric defect was closed with a running 3-0 Vicryl suture. The abdomen was irrigated and good hemostasis was noted. The lap placed in the right paracolic gutter was removed and inspection showed no evidence of bleeding from the ovarian vessel. With the sponge tape placement count correct and after palpation of the liver was found to be unremarkable the fascia was closed with interrupted 0 Surgilon figure-of-eight sutures. The umbilical adipose tissue was reapproximated with a 2-0 Vicryl figure-of-eight suture and the adipose tissue closed with a running  2-0 Vicryl suture. The skin was closed with a running 4-0 Vicryl septic suture. Benzoin, Steri-Strips, Telfa dressings and a dressing was applied.  The  patient tolerated the procedure well and was taken to recovery in stable condition.

## 2016-06-05 NOTE — Anesthesia Post-op Follow-up Note (Cosign Needed)
Anesthesia QCDR form completed.        

## 2016-06-05 NOTE — H&P (Signed)
No change in clinical condition or exam. For right hemicolectomy to remove obstructing colon cancer.

## 2016-06-05 NOTE — Progress Notes (Signed)
Spoke with Dr. Margaretmary Eddy about patient's current BP and pulse. Pt has metoprolol ordered to be given tonight - questioning if it should be held or given. MD gave order to hold metoprolol at this time.

## 2016-06-05 NOTE — Transfer of Care (Signed)
Immediate Anesthesia Transfer of Care Note  Patient: Alexis Haas  Procedure(s) Performed: Procedure(s): LAPAROSCOPIC RIGHT COLECTOMY (Right) HERNIA REPAIR UMBILICAL ADULT  Patient Location: PACU  Anesthesia Type:General  Level of Consciousness: sedated  Airway & Oxygen Therapy: Patient Spontanous Breathing and Patient connected to face mask oxygen  Post-op Assessment: Report given to RN and Post -op Vital signs reviewed and stable  Post vital signs: Reviewed and stable  Last Vitals:  Vitals:   06/05/16 0716 06/05/16 1113  BP: (!) 148/53 (!) 112/49  Pulse: (!) 50 66  Resp: 16 19  Temp: 36.6 C 48.3 C    Complications: No apparent anesthesia complications

## 2016-06-05 NOTE — Anesthesia Preprocedure Evaluation (Signed)
Anesthesia Evaluation  Patient identified by MRN, date of birth, ID band Patient awake    Reviewed: Allergy & Precautions, NPO status , Patient's Chart, lab work & pertinent test results  History of Anesthesia Complications Negative for: history of anesthetic complications  Airway Mallampati: II  TM Distance: >3 FB Neck ROM: Full    Dental  (+) Edentulous Upper, Missing   Pulmonary neg pulmonary ROS, neg sleep apnea, neg COPD,    breath sounds clear to auscultation- rhonchi (-) wheezing      Cardiovascular hypertension, Pt. on medications + CAD and + CABG (2005 2v)   Rhythm:Regular Rate:Normal - Systolic murmurs and - Diastolic murmurs    Neuro/Psych negative neurological ROS  negative psych ROS   GI/Hepatic Neg liver ROS, PUD, GERD  ,  Endo/Other  negative endocrine ROS  Renal/GU Renal InsufficiencyRenal disease     Musculoskeletal  (+) Arthritis ,   Abdominal (+) - obese,   Peds  Hematology  (+) anemia ,   Anesthesia Other Findings Past Medical History: No date: Anemia No date: Arthritis No date: GERD (gastroesophageal reflux disease) No date: Heart disease No date: Hypercholesteremia No date: Hypertension No date: Insomnia   Reproductive/Obstetrics                             Anesthesia Physical  Anesthesia Plan  ASA: III  Anesthesia Plan: General   Post-op Pain Management:    Induction: Intravenous  Airway Management Planned: Oral ETT  Additional Equipment:   Intra-op Plan:   Post-operative Plan:   Informed Consent: I have reviewed the patients History and Physical, chart, labs and discussed the procedure including the risks, benefits and alternatives for the proposed anesthesia with the patient or authorized representative who has indicated his/her understanding and acceptance.   Dental advisory given  Plan Discussed with: CRNA and  Anesthesiologist  Anesthesia Plan Comments:         Anesthesia Quick Evaluation

## 2016-06-05 NOTE — Consult Note (Signed)
Reason for Consult: No chief complaint on file.  Referring Physician: lillionna, nabi is an 83 y.o. female.  HPI: Patient is a 83 year old female with chronic medical history of iron deficiency anemia followed up by gastroenterology, recent colonoscopy with a diagnosis of ascending colon cancer. PET scan was negative for metastases. Patient is admitted to the surgical service for colectomy and hospitalist team is consulted for medical management. Patient is resting comfortably during my examination. Patient  denies any pain, chest pain or shortness of breath Son is at bedside.  Past Medical History:  Diagnosis Date  . Anemia   . Arthritis   . Cancer ([place]) 04/15/2016   ADENOCARCINOMA. transverse colon  . Chronic kidney disease    LABS 3/18 COMPARED WITH 1 YEAR AGO  . Coronary artery disease   . Fall 2018  . GERD (gastroesophageal reflux disease)   . Heart disease   . Hypercholesteremia   . Hypertension   . Insomnia     Past Surgical History:  Procedure Laterality Date  . CARDIAC SURGERY  2005   bypass  . COLONOSCOPY WITH PROPOFOL N/A 04/15/2016   Procedure: COLONOSCOPY WITH PROPOFOL;  Surgeon: Lucilla Lame, MD;  Location: [facility] ENDOSCOPY;  Service: Endoscopy;  Laterality: N/A;  . CORONARY ARTERY BYPASS GRAFT  2005   x 2  . ESOPHAGOGASTRODUODENOSCOPY (EGD) WITH PROPOFOL N/A 04/15/2016   Procedure: ESOPHAGOGASTRODUODENOSCOPY (EGD) WITH PROPOFOL;  Surgeon: Lucilla Lame, MD;  Location: [facility] ENDOSCOPY;  Service: Endoscopy;  Laterality: N/A;  . toe removal    . TONSILLECTOMY     age 69    Family history; cardiac conditions runs in her family.  Social History:  reports that she has never smoked. She has never used smokeless tobacco. She reports that she does not drink alcohol or use drugs.  Allergies:  Allergies  Allergen Reactions  . Amoxicillin Rash    Has patient had a PCN reaction causing immediate rash, facial/tongue/throat swelling, SOB or lightheadedness with  hypotension:Yes Has patient had a PCN reaction causing severe rash involving mucus membranes or skin necrosis:unsure Has patient had a PCN reaction that required hospitalization:No Has patient had a PCN reaction occurring within the last 10 years:No If all of the above answers are "NO", then may proceed with Cephalosporin use.     Medications: I have reviewed the patient's current medications.  No results found for this or any previous visit (from the past 48 hour(s)).  No results found.  ROS:  CONSTITUTIONAL: Denies fevers, chills. Denies any fatigue, weakness.  EYES: Denies blurry vision, double vision, eye pain. EARS, NOSE, THROAT: Denies tinnitus, ear pain, hearing loss. RESPIRATORY: Denies cough, wheeze, shortness of breath.  CARDIOVASCULAR: Denies chest pain, palpitations, edema.  GASTROINTESTINAL:  patient just had abdominal surgery .Denies nausea, vomiting, diarrhea, abdominal pain. Denies bright red blood per rectum. GENITOURINARY: Denies dysuria, hematuria. ENDOCRINE: Denies nocturia or thyroid problems. HEMATOLOGIC AND LYMPHATIC: Denies easy bruising or bleeding. SKIN: Denies rash or lesion. MUSCULOSKELETAL: Denies pain in neck, back, shoulder, knees, hips or arthritic symptoms.  NEUROLOGIC: Denies paralysis, paresthesias.  PSYCHIATRIC: Denies anxiety or depressive symptoms. Blood pressure (!) 109/46, pulse (!) 44, temperature 97.6 F (36.4 C), temperature source Oral, resp. rate 20, height 5' 3.5" (1.613 m), weight 76.2 kg (168 lb), SpO2 98 %.   PHYSICAL EXAMINATION:  GENERAL: Well-nourished, well-developed , currently in no acute distress.  HEAD: Normocephalic, atraumatic.  EYES: Pupils equal, round, and reactive to light. Extraocular muscles intact. No scleral icterus.  MOUTH:  Moist mucosal membranes. Dentition intact. No abscess noted. EARS, NOSE, THROAT: Clear without exudates. No external lesions.  NECK: Supple. No thyromegaly. No nodules. No JVD.  PULMONARY:  Clear to auscultation bilaterally without wheezes, rales, or rhonchi. No use of accessory muscles. Good respiratory effort. CHEST: Nontender to palpation.  CARDIOVASCULAR: S1, S2, regular rate and rhythm. No murmurs, rubs, or gallops.  GASTROINTESTINAL: midline incision with a clear honeycomb dressing. Soft,  No bowel sounds.  MUSCULOSKELETAL: No swelling, clubbing, edema. Range of motion full in all extremities. NEUROLOGIC: No gross focal neurological deficits. Sensation intact. Reflexes intact. SKIN: No ulcerations, lesions, rash, cyanosis. Skin warm, dry. Turgor intact. PSYCHIATRIC: Mood, affect within normal limits. Patient awake, alert, oriented x 3. Insight and judgment intact.   Assessment/Plan:  #Essential hypertension continue home medication metoprolol and titrate as needed #Chronic kidney disease monitor renal function closely. Recent creatinine on 05/07/2016 is at 1.5 with a GFR 33 will consider nephrology consult if needed. Avoid nephrotoxins Repeat a.m. Labs #Chronic iron deficiency anemia probably secondary to colon cancer and status post colectomy. Monitor CBC closely and if needed we will provide blood transfusions #History of gastric ulcer-PPI #History of coronary artery disease-patient is asymptomatic. Continue aspirin 81 mg, statin and beta blockers #History of colon cancer status post right hemi-colectomy with the interview transverse colostomy, management per surgery   DVT prophylaxis with Lovenox Thank you for consulting hospitalist team for medical management  Patient is full code, daughter Opal Sidles is the healthcare power of attorney   [place] THIS PATIENT: 70minutes.   Note: This dictation was prepared with Dragon dictation along with smaller phrase technology. Any transcriptional errors that result from this process are unintentional.   @[facility] @ Pager - [phone] 06/05/2016, 3:07 PM

## 2016-06-05 NOTE — Anesthesia Procedure Notes (Addendum)
Procedure Name: Intubation Date/Time: 06/05/2016 8:49 AM Performed by: Doreen Salvage Pre-anesthesia Checklist: Patient identified, Patient being monitored, Timeout performed, Emergency Drugs available and Suction available Patient Re-evaluated:Patient Re-evaluated prior to inductionOxygen Delivery Method: Circle system utilized Preoxygenation: Pre-oxygenation with 100% oxygen Intubation Type: IV induction Ventilation: Mask ventilation without difficulty Laryngoscope Size: Mac and 3 Grade View: Grade I Tube type: Oral Tube size: 7.0 mm Number of attempts: 1 Airway Equipment and Method: Stylet Placement Confirmation: ETT inserted through vocal cords under direct vision,  positive ETCO2 and breath sounds checked- equal and bilateral Secured at: 22 (at the gums) cm Tube secured with: Tape Dental Injury: Teeth and Oropharynx as per pre-operative assessment

## 2016-06-06 LAB — CBC WITH DIFFERENTIAL/PLATELET
Basophils Absolute: 0 10*3/uL (ref 0–0.1)
Basophils Relative: 0 %
EOS PCT: 0 %
Eosinophils Absolute: 0 10*3/uL (ref 0–0.7)
HCT: 28.3 % — ABNORMAL LOW (ref 35.0–47.0)
HEMOGLOBIN: 9.4 g/dL — AB (ref 12.0–16.0)
LYMPHS ABS: 0.7 10*3/uL — AB (ref 1.0–3.6)
LYMPHS PCT: 9 %
MCH: 27.7 pg (ref 26.0–34.0)
MCHC: 33.3 g/dL (ref 32.0–36.0)
MCV: 83.2 fL (ref 80.0–100.0)
Monocytes Absolute: 0.8 10*3/uL (ref 0.2–0.9)
Monocytes Relative: 10 %
NEUTROS ABS: 6.5 10*3/uL (ref 1.4–6.5)
NEUTROS PCT: 81 %
PLATELETS: 193 10*3/uL (ref 150–440)
RBC: 3.4 MIL/uL — AB (ref 3.80–5.20)
RDW: 18.8 % — ABNORMAL HIGH (ref 11.5–14.5)
WBC: 8 10*3/uL (ref 3.6–11.0)

## 2016-06-06 MED ORDER — PRAVASTATIN SODIUM 40 MG PO TABS: 40.0000 mg | ORAL_TABLET | Freq: Every day | ORAL | Status: DC

## 2016-06-06 MED ORDER — METOPROLOL TARTRATE 25 MG PO TABS
12.5000 mg | ORAL_TABLET | Freq: Two times a day (BID) | ORAL | Status: DC
  Administered 2016-06-06 – 2016-06-09 (×6): 12.5 mg via ORAL
  Filled 2016-06-06 (×6): qty 1

## 2016-06-06 MED ORDER — ASPIRIN EC 81 MG PO TBEC
81.0000 mg | DELAYED_RELEASE_TABLET | Freq: Every day | ORAL | Status: DC
  Administered 2016-06-06: 81 mg via ORAL

## 2016-06-06 NOTE — Progress Notes (Addendum)
[place] [facility] [place] NAME: Alexis Haas    MR#:  [id number]  DATE OF BIRTH:  [birth year]  SUBJECTIVE:  Patient is postop colon resection for colon mass. Denies any complaints. Tolerating clear liquid  REVIEW OF SYSTEMS:   Review of Systems  Constitutional: Negative for chills, fever and weight loss.  HENT: Negative for ear discharge, ear pain and nosebleeds.   Eyes: Negative for blurred vision, pain and discharge.  Respiratory: Negative for sputum production, shortness of breath, wheezing and stridor.   Cardiovascular: Negative for chest pain, palpitations, orthopnea and PND.  Gastrointestinal: Negative for abdominal pain, diarrhea, nausea and vomiting.  Genitourinary: Negative for frequency and urgency.  Musculoskeletal: Negative for back pain and joint pain.  Neurological: Positive for weakness. Negative for sensory change, speech change and focal weakness.  Psychiatric/Behavioral: Negative for depression and hallucinations. The patient is not nervous/anxious.    Tolerating Diet: Clear liquid Tolerating PT: Pending  DRUG ALLERGIES:   Allergies  Allergen Reactions  . Amoxicillin Rash    Has patient had a PCN reaction causing immediate rash, facial/tongue/throat swelling, SOB or lightheadedness with hypotension:Yes Has patient had a PCN reaction causing severe rash involving mucus membranes or skin necrosis:unsure Has patient had a PCN reaction that required hospitalization:No Has patient had a PCN reaction occurring within the last 10 years:No If all of the above answers are "NO", then may proceed with Cephalosporin use.     VITALS:  Blood pressure (!) 136/37, pulse (!) 59, temperature 98.1 F (36.7 C), temperature source Oral, resp. rate 16, height 5' 3.5" (1.613 m), weight 76.2 kg (168 lb), SpO2 95 %.  PHYSICAL EXAMINATION:   Physical Exam  GENERAL:  83 y.o.-year-old patient lying in the bed with no acute distress.   EYES: Pupils equal, round, reactive to light and accommodation. No scleral icterus. Extraocular muscles intact.  HEENT: Head atraumatic, normocephalic. Oropharynx and nasopharynx clear.  NECK:  Supple, no jugular venous distention. No thyroid enlargement, no tenderness.  LUNGS: Normal breath sounds bilaterally, no wheezing, rales, rhonchi. No use of accessory muscles of respiration.  CARDIOVASCULAR: S1, S2 normal. No murmurs, rubs, or gallops.  ABDOMEN: Soft, nontender, distended.Occasional Bowel sounds present. No organomegaly or mass. Surgical incision EXTREMITIES: No cyanosis, clubbing or edema b/l.    NEUROLOGIC: Cranial nerves II through XII are intact. No focal Motor or sensory deficits b/l.   PSYCHIATRIC:  patient is alert and oriented x 3.  SKIN: No obvious rash, lesion, or ulcer.   LABORATORY PANEL:  CBC  Recent Labs Lab 06/06/16 0448  WBC 8.0  HGB 9.4*  HCT 28.3*  PLT 193    Chemistries   Recent Labs Lab 06/05/16 1533  NA 135  K 3.6  CL 103  CO2 19*  GLUCOSE 166*  BUN 26*  CREATININE 1.19*  CALCIUM 8.3*  AST 20  ALT 13*  ALKPHOS 47  BILITOT 0.6   Cardiac Enzymes No results for input(s): TROPONINI in the last 168 hours. RADIOLOGY:  No results found. ASSESSMENT AND PLAN:   83 year old female with chronic medical history of iron deficiency anemia followed up by gastroenterology, recent colonoscopy with a diagnosis of ascending colon cancer. PET scan was negative for metastases. Patient is admitted to the surgical service for colectomy and hospitalist team is consulted for medical management  #Essential hypertension continue home medication metoprolol  -hold HCTZ due to elevated creat  #Chronic kidney disease monitor renal function closely. Recent creatinine on 05/07/2016  is at 1.3 with a GFR 33 Avoid nephrotoxins  -1.36--1.50--1.19  #Chronic iron deficiency anemia probably secondary to colon cancer and status post colectomy. POD #1 -CLD PT to  see Doing well  -#History of gastric ulcer-PPI  #History of coronary artery disease-patient is asymptomatic. - Continue aspirin 81 mg, statin and beta blockers  PT to see  CSW for d/c planning  Spoke with dter  Case discussed with Care Management/Social Worker. Management plans discussed with the patient, family and they are in agreement.  CODE STATUS: full  DVT Prophylaxis: lovenox  TOTAL TIME TAKING CARE OF THIS PATIENT: 30 minutes.  >50% time spent on counselling and coordination of care  POSSIBLE D/C IN 1-2 DAYS, DEPENDING ON CLINICAL CONDITION.  Note: This dictation was prepared with Dragon dictation along with smaller phrase technology. Any transcriptional errors that result from this process are unintentional.  Alexis Haas M.D on 06/06/2016 at 9:44 AM  Between 7am to 6pm - Pager - [phone]  After 6pm go to [email or web address] - password [email or web address] [email or web address]  Office  [phone]  CC: Primary care physician; Lynnell Jude, MD

## 2016-06-06 NOTE — Progress Notes (Signed)
Called Dr. Posey Pronto to let her know that the patients BP and HR were low and asked whether to give metoprolol or hold.  She gave a verbal order to change dose to 12.5mg  BID and it was ok to give this am.

## 2016-06-06 NOTE — Clinical Social Work Note (Signed)
Clinical Social Work Assessment  Patient Details  Name: Alexis Haas MRN: [id number] Date of Birth: 03/07/1917  Date of referral:  06/06/16               Reason for consult:  Facility Placement                Permission sought to share information with:  Family Supports Permission granted to share information::  Yes, Verbal Permission Granted  Name::        Agency::     Relationship::     Contact Information:     Housing/Transportation Living arrangements for the past 2 months:  Single Family Home Source of Information:  Patient Patient Interpreter Needed:  None Criminal Activity/Legal Involvement Pertinent to Current Situation/Hospitalization:  No - Comment as needed Significant Relationships:   (neice) Lives with:   (neice) Do you feel safe going back to the place where you live?  Yes Need for family participation in patient care:  Yes (Comment)  Care giving concerns:  Patient resides at home and her niece resides with her.   Social Worker assessment / plan:  CSW met with patient this afternoon in her room and she had family visiting. CSW explained role and purpose of visit. Patient stated that she felt that she was doing well enough to go home at discharge and family in the room confirmed her niece would be staying with her 24/7 and that other family members and friends would be in and out. Patient voiced no concerns or questions at this time.  Employment status:  Retired Insurance information:  Managed Medicare PT Recommendations:    Information / Referral to community resources:     Patient/Family's Response to care:  Patient expressed appreciation for CSW visit and time.  Patient/Family's Understanding of and Emotional Response to Diagnosis, Current Treatment, and Prognosis:  Patient has good insight into what her limitations and abilities are and she is happy about returning home when time.  Emotional Assessment Appearance:  Appears younger than stated  age Attitude/Demeanor/Rapport:   (pleasant and cooperative) Affect (typically observed):  Happy, Hopeful, Calm, Pleasant Orientation:  Oriented to Self, Oriented to Place, Oriented to  Time, Oriented to Situation Alcohol / Substance use:  Not Applicable Psych involvement (Current and /or in the community):  No (Comment)  Discharge Needs  Concerns to be addressed:  No discharge needs identified Readmission within the last 30 days:  No Current discharge risk:  None Barriers to Discharge:  No Barriers Identified    , LCSW 06/06/2016, 2:12 PM  

## 2016-06-06 NOTE — Progress Notes (Signed)
Called Dr. Jamal Collin to let him know that the patient has not been able to void since we pulled the foley this morning.  I did a bladder scan which showed the patient had 950cc in her bladder.  He acknowledged and gave a verbal order to place the foley back in and leave over night and remove early in the morning.

## 2016-06-06 NOTE — Evaluation (Signed)
Physical Therapy Evaluation Patient Details Name: Alexis Haas MRN: [id number] DOB: Jan 24, 1918 Today's Date: 06/06/2016   History of Present Illness  Pt is a 83 year old female with chronic medical history of iron deficiency anemia followed up by gastroenterology, recent colonoscopy with a diagnosis of ascending colon cancer. PET scan was negative for metastases. Patient was admitted to the surgical service for colectomy. Pt is now s/p laparoscopically assisted R hemicolectomy with ileotransverse colostomy.  Pt's PMH includes CKD, CAD.    Clinical Impression  Patient is s/p above surgery resulting in functional limitations due to the deficits listed below (see PT Problem List). Alexis Haas was Ind using RW PTA.  She currently requires min guard assist for transfers and ambulation for safe technique and proper management of RW.  She will have 24/7 assist/supervision available from her niece at d/c. Patient will benefit from skilled PT to increase their independence and safety with mobility to allow discharge to the venue listed below.      Follow Up Recommendations Home health PT;Supervision for mobility/OOB    Equipment Recommendations  None recommended by PT    Recommendations for Other Services       Precautions / Restrictions Precautions Precautions: Fall;Other (comment) Precaution Comments: abdominal incision Restrictions Weight Bearing Restrictions: No      Mobility  Bed Mobility               General bed mobility comments: Pt sitting in chair upon PT arrival  Transfers Overall transfer level: Needs assistance Equipment used: Rolling walker (2 wheeled) Transfers: Sit to/from Stand Sit to Stand: Min guard         General transfer comment: Pt demonstrates safe technique for sit>stand but requires cues to back up all the way to the chair keeping RW with her before attempting to sit.  Ambulation/Gait Ambulation/Gait assistance: Min guard Ambulation Distance  (Feet): 230 Feet Assistive device: Rolling walker (2 wheeled) Gait Pattern/deviations: Step-through pattern;Decreased stride length;Trunk flexed Gait velocity: decreased Gait velocity interpretation: Below normal speed for age/gender General Gait Details: Pt pushes RW too far ahead of her and demonstrates flexed posture.  She improves this with verbal cues but is unable to maintain without additional verbal cues.  Slow but steady gait.  Stairs Stairs: Yes Stairs assistance: Min guard Stair Management: Two rails;Forwards;Step to pattern Number of Stairs: 3 General stair comments: Min guard for safety due to mild instability. Pt reports h/o chronic Bil knee arthritis so she performs with step to pattern.   Wheelchair Mobility    Modified Rankin (Stroke Patients Only)       Balance Overall balance assessment: Needs assistance;History of Falls Sitting-balance support: No upper extremity supported;Feet supported Sitting balance-Leahy Scale: Good     Standing balance support: Bilateral upper extremity supported;During functional activity Standing balance-Leahy Scale: Poor Standing balance comment: Relies on RW for support with static and dynamic activities                             Pertinent Vitals/Pain Pain Assessment: No/denies pain    Home Living Family/patient expects to be discharged to:: Private residence Living Arrangements: Alone Available Help at Discharge: Family;Available 24 hours/day (niece to stay with pt 24/7 at d/c) Type of Home: House Home Access: Stairs to enter Entrance Stairs-Rails: Left;Right;Can reach both Entrance Stairs-Number of Steps: 3 Home Layout: One level Home Equipment: Grab bars - tub/shower;Transport chair;Cane - single point;Walker - 2 wheels  Prior Function Level of Independence: Independent with assistive device(s)         Comments: Pt ind ambulating with RW at all times PTA.  She reports 7 falls in the past 2 years  which prompted her to begin using RW.  She lives alone and is ind with bathing, dressing, cooking, cleaning.  She does have a maid that comes 1x/month for full house clean.  Pt still drives but she typically does her grocery shopping with her daughter who lives in [place].     Hand Dominance        Extremity/Trunk Assessment   Upper Extremity Assessment Upper Extremity Assessment:  (Strength grossly 4-/5 BUEs)    Lower Extremity Assessment Lower Extremity Assessment:  (Strength grossly 4-/5 BLEs)    Cervical / Trunk Assessment Cervical / Trunk Assessment: Other exceptions Cervical / Trunk Exceptions: abdominal incision with bandage s/p surgical prcedure listed above  Communication   Communication: No difficulties  Cognition Arousal/Alertness: Awake/alert Behavior During Therapy: WFL for tasks assessed/performed Overall Cognitive Status: Within Functional Limits for tasks assessed                                        General Comments      Exercises Other Exercises Other Exercises: Encouraged pt to practice upright posture when sitting or standing for proper healing of surgical site.  Pt verbalized understanding.   Assessment/Plan    PT Assessment Patient needs continued PT services  PT Problem List Decreased strength;Decreased range of motion;Decreased balance;Decreased knowledge of use of DME;Decreased safety awareness;Pain       PT Treatment Interventions DME instruction;Gait training;Stair training;Functional mobility training;Therapeutic activities;Therapeutic exercise;Balance training;Patient/family education;Modalities    PT Goals (Current goals can be found in the Care Plan section)  Acute Rehab PT Goals Patient Stated Goal: to go home PT Goal Formulation: With patient Time For Goal Achievement: 06/20/16 Potential to Achieve Goals: Good    Frequency Min 2X/week   Barriers to discharge        Co-evaluation               End of  Session Equipment Utilized During Treatment: Gait belt Activity Tolerance: Patient tolerated treatment well Patient left: in chair;with call bell/phone within reach;with chair alarm set;with family/visitor present Nurse Communication: Mobility status PT Visit Diagnosis: Muscle weakness (generalized) (M62.81);Unsteadiness on feet (R26.81)    Time: 3716-9678 PT Time Calculation (min) (ACUTE ONLY): 19 min   Charges:   PT Evaluation $PT Eval Low Complexity: 1 Procedure     PT G Codes:        Collie Siad PT, DPT 06/06/2016, 3:18 PM

## 2016-06-06 NOTE — Progress Notes (Signed)
Initial Nutrition Assessment  DOCUMENTATION CODES:   Not applicable  INTERVENTION:  No nutrition interventions warranted at this time. Patient not malnourished and reports her appetite remains good. Will follow to see if any nutrition issues arise.  NUTRITION DIAGNOSIS:   Inadequate oral intake related to acute illness as evidenced by other (see comment) (CLD s/p hemicolectomy).  GOAL:   Patient will meet greater than or equal to 90% of their needs  MONITOR:   PO intake, Diet advancement, Labs, Weight trends, I & O's  REASON FOR ASSESSMENT:   Malnutrition Screening Tool    ASSESSMENT:   83 year old female with PMHx of GERD, HTN, hypercholesteremia, CAD, CKD, colon cancer now s/p laparascopically assited right hemicolectomy with ileotransverse colostomy on 4/26.   Spoke with patient and her daughter at bedside. Patient reports she has a good appetite now and PTA. She found out she had colon cancer on 04/15/2016 and has since been following a low residue diet under direction of the GI physician who performed her colonoscopy. She reports eating 100% of three meals per day. She usually has a small breakfast of grits with coffee. For lunch she has a sandwich. For dinner she has rice and chicken or soup. Patient lives alone. Patient denies any N/V or abdominal pain. Reports she has not had a BM yet since operation yesterday.  UBW 174 lbs per patient. She reports she slowly lost some weight over 2 years. This weight loss not evident in chart. For the past year patient has been fairly weight stable.  Meal Completion: 100% of CLD today  Medications reviewed and include: pantoprazole, D5-LR @ 75 ml/hr (1.8 L, 90 grams dextrose, 306 kcal daily).   Labs reviewed: CO2 19, BUN 26, Creatinine 1.19.   Nutrition-Focused physical exam completed. Findings are mild-moderate fat depletion in upper arm region, mild-moderate muscle depletion in patellar and anterior thigh regions, and no edema.    Discussed with RN.  Diet Order:  Diet clear liquid Room service appropriate? Yes; Fluid consistency: Thin  Skin:  Reviewed, no issues  Last BM:  06/05/2016  Height:   Ht Readings from Last 1 Encounters:  06/05/16 5' 3.5" (1.613 m)    Weight:   Wt Readings from Last 1 Encounters:  06/05/16 168 lb (76.2 kg)    Ideal Body Weight:  53.4 kg  BMI:  Body mass index is 29.29 kg/m.  Estimated Nutritional Needs:   Kcal:  1365-1590 (MSJ x 1.2-1.4)  Protein:  76-90 grams (1-1.2 grams/kg)  Fluid:  1.4-1.6 L/day  EDUCATION NEEDS:   No education needs identified at this time  Willey Blade, MS, RD, LDN Pager: [phone] After Hours Pager: [phone]

## 2016-06-06 NOTE — Progress Notes (Signed)
Afebrile. VSS. ((Runs low diastolic BP in office.)> Minimal pain, Norco yesterday. No nausea. Lungs: Clear. Cardio: RR. ABD: Minimal distension, soft, non-tender. Dressings: Dry. Extrem: Soft. Labs: reviewed. U/O: Excellent. Plan: Continue clear liquids until appetite develops. Ambulate today. SS to assess re: short term rehab.

## 2016-06-07 MED ORDER — HYDROCHLOROTHIAZIDE 25 MG PO TABS
25.0000 mg | ORAL_TABLET | Freq: Every day | ORAL | Status: DC
  Administered 2016-06-07 – 2016-06-09 (×3): 25 mg via ORAL
  Filled 2016-06-07 (×3): qty 1

## 2016-06-07 MED ORDER — BISACODYL 10 MG RE SUPP
10.0000 mg | Freq: Once | RECTAL | Status: DC
  Filled 2016-06-07: qty 1

## 2016-06-07 NOTE — Progress Notes (Signed)
[facility] [place] NAME: Alexis Haas    MR#:  [id number]  DATE OF BIRTH:  [birth year]  SUBJECTIVE:  Patient is postop colon resection for colon mass. Denies any complaints. Tolerating clear liquid POD #2 Patient out of the chair, wants to eat some solid food Passing gas  REVIEW OF SYSTEMS:   Review of Systems  Constitutional: Negative for chills, fever and weight loss.  HENT: Negative for ear discharge, ear pain and nosebleeds.   Eyes: Negative for blurred vision, pain and discharge.  Respiratory: Negative for sputum production, shortness of breath, wheezing and stridor.   Cardiovascular: Negative for chest pain, palpitations, orthopnea and PND.  Gastrointestinal: Negative for abdominal pain, diarrhea, nausea and vomiting.  Genitourinary: Negative for frequency and urgency.  Musculoskeletal: Negative for back pain and joint pain.  Neurological: Positive for weakness. Negative for sensory change, speech change and focal weakness.  Psychiatric/Behavioral: Negative for depression and hallucinations. The patient is not nervous/anxious.    Tolerating Diet: Clear liquid Tolerating PT: Home health PT  DRUG ALLERGIES:   Allergies  Allergen Reactions  . Amoxicillin Rash    Has patient had a PCN reaction causing immediate rash, facial/tongue/throat swelling, SOB or lightheadedness with hypotension:Yes Has patient had a PCN reaction causing severe rash involving mucus membranes or skin necrosis:unsure Has patient had a PCN reaction that required hospitalization:No Has patient had a PCN reaction occurring within the last 10 years:No If all of the above answers are "NO", then may proceed with Cephalosporin use.     VITALS:  Blood pressure (!) 169/75, pulse 70, temperature 98.5 F (36.9 C), temperature source Oral, resp. rate 18, height 5' 3.5" (1.613 m), weight 76.2 kg (168 lb), SpO2 97 %.  PHYSICAL EXAMINATION:   Physical  Exam  GENERAL:  83 y.o.-year-old patient lying in the bed with no acute distress.  EYES: Pupils equal, round, reactive to light and accommodation. No scleral icterus. Extraocular muscles intact.  HEENT: Head atraumatic, normocephalic. Oropharynx and nasopharynx clear.  NECK:  Supple, no jugular venous distention. No thyroid enlargement, no tenderness.  LUNGS: Normal breath sounds bilaterally, no wheezing, rales, rhonchi. No use of accessory muscles of respiration.  CARDIOVASCULAR: S1, S2 normal. No murmurs, rubs, or gallops.  ABDOMEN: Soft, nontender, distended.Occasional Bowel sounds present. No organomegaly or mass. Surgical incision EXTREMITIES: No cyanosis, clubbing or edema b/l.    NEUROLOGIC: Cranial nerves II through XII are intact. No focal Motor or sensory deficits b/l.   PSYCHIATRIC:  patient is alert and oriented x 3.  SKIN: No obvious rash, lesion, or ulcer.   LABORATORY PANEL:  CBC  Recent Labs Lab 06/06/16 0448  WBC 8.0  HGB 9.4*  HCT 28.3*  PLT 193    Chemistries   Recent Labs Lab 06/05/16 1533  NA 135  K 3.6  CL 103  CO2 19*  GLUCOSE 166*  BUN 26*  CREATININE 1.19*  CALCIUM 8.3*  AST 20  ALT 13*  ALKPHOS 47  BILITOT 0.6   Cardiac Enzymes No results for input(s): TROPONINI in the last 168 hours. RADIOLOGY:  No results found. ASSESSMENT AND PLAN:   83 year old female with chronic medical history of iron deficiency anemia followed up by gastroenterology, recent colonoscopy with a diagnosis of ascending colon cancer. PET scan was negative for metastases. Patient is admitted to the surgical service for colectomy and hospitalist team is consulted for medical management  #Essential hypertension continue home medication metoprolol ,, Hydrochlorothiazide   #  Chronic kidney disease monitor renal function closely. Recent creatinine on 05/07/2016 is at 1.3 with a GFR 33 Avoid nephrotoxins  -1.36--1.50--1.19  #Chronic iron deficiency anemia probably  secondary to colon cancer and status post colectomy. POD #1 -CLD PT --- recommended home health PT  Doing well  -#History of gastric ulcer-PPI  #History of coronary artery disease-patient is asymptomatic. - Continue aspirin 81 mg, statin and beta blockers  CSW for d/c planning  Spoke with dters in the room  Case discussed with Care Management/Social Worker. Management plans discussed with the patient, family and they are in agreement.  CODE STATUS: full  DVT Prophylaxis: lovenox  TOTAL TIME TAKING CARE OF THIS PATIENT: 30 minutes.  >50% time spent on counselling and coordination of care  POSSIBLE D/C IN 1-2 DAYS, DEPENDING ON CLINICAL CONDITION.  Note: This dictation was prepared with Dragon dictation along with smaller phrase technology. Any transcriptional errors that result from this process are unintentional.  [name] M.D on 06/07/2016 at 10:58 AM  Between 7am to 6pm - Pager - [phone]  After 6pm go to [email or web address] - password [email or web address] [facility]  Office  [phone]  CC: Primary care physician; Lynnell Jude, MD

## 2016-06-07 NOTE — Progress Notes (Signed)
Patient ID: MADDUX FIRST, female   DOB: 09/25/1917, 83 y.o.   MRN: [id number] Yesterday the patient was unable to void after the Foley was removed. She had over 900 mL residual in her bladder and the Foley was reinserted. Patient states that she has had a little trouble with the urination even before her surgery but it has not been addressed before. She is   passing very little flatus. She did have a very small liquid green stool yesterday. No nausea or vomiting She is afebrile and vital signs are stable Abdomen is moderately distended but soft with active bowel sounds Incisions clean Lungs are clear Overall stable Plan: We'll try removing Foley this morning and see if she has a trouble voiding again in which case we'll request urology evaluation. Dulcolax suppository once this morning. Continue with clear liquids until her abdominal distention is better and her bowels functioning a little better.

## 2016-06-07 NOTE — Progress Notes (Signed)
Patient complained of dizziness. Vital signs taken lying and sitting.  in addition, bladder scan was 961.  Per Dr. Posey Pronto I & O cath prn.  Lauris Poag, RN 06/07/16 1352

## 2016-06-08 MED ORDER — HYDRALAZINE HCL 20 MG/ML IJ SOLN
10.0000 mg | INTRAMUSCULAR | Status: DC | PRN
Start: 2016-06-08 — End: 2016-06-09
  Administered 2016-06-08 (×2): 10 mg via INTRAVENOUS
  Filled 2016-06-08 (×2): qty 1

## 2016-06-08 MED ORDER — DEXTROSE IN LACTATED RINGERS 5 % IV SOLN
INTRAVENOUS | Status: DC
  Administered 2016-06-08 – 2016-06-09 (×2): via INTRAVENOUS

## 2016-06-08 NOTE — Progress Notes (Signed)
Patient ID: Alexis Haas, female   DOB: September 04, 1917, 83 y.o.   MRN: [id number] Patient with no complaints. Had a good bowel movement yesterday. Vital signs stable Lungs are clear Abdomen is soft with the good bowel sounds today and distention is much decreased Incisions clean Still having trouble voiding and required in and out cath when necessary Overall she's making good progress Will advance diet and discontinue IV if diet well-tolerated

## 2016-06-08 NOTE — Progress Notes (Signed)
[name] NAME: Alexis Haas    MR#:  [id number]  DATE OF BIRTH:  05-10-1917  SUBJECTIVE:  Patient is postop colon resection for colon mass. Denies any complaints. Tolerating clear liquid POD #3 Patient out of the chair, had good BM  REVIEW OF SYSTEMS:   Review of Systems  Constitutional: Negative for chills, fever and weight loss.  HENT: Negative for ear discharge, ear pain and nosebleeds.   Eyes: Negative for blurred vision, pain and discharge.  Respiratory: Negative for sputum production, shortness of breath, wheezing and stridor.   Cardiovascular: Negative for chest pain, palpitations, orthopnea and PND.  Gastrointestinal: Negative for abdominal pain, diarrhea, nausea and vomiting.  Genitourinary: Negative for frequency and urgency.  Musculoskeletal: Negative for back pain and joint pain.  Neurological: Positive for weakness. Negative for sensory change, speech change and focal weakness.  Psychiatric/Behavioral: Negative for depression and hallucinations. The patient is not nervous/anxious.    Tolerating Diet: Clear liquid Tolerating PT: Home health PT  DRUG ALLERGIES:   Allergies  Allergen Reactions  . Amoxicillin Rash    Has patient had a PCN reaction causing immediate rash, facial/tongue/throat swelling, SOB or lightheadedness with hypotension:Yes Has patient had a PCN reaction causing severe rash involving mucus membranes or skin necrosis:unsure Has patient had a PCN reaction that required hospitalization:No Has patient had a PCN reaction occurring within the last 10 years:No If all of the above answers are "NO", then may proceed with Cephalosporin use.     VITALS:  Blood pressure (!) 153/50, pulse 65, temperature 98.4 F (36.9 C), temperature source Oral, resp. rate 16, height 5' 3.5" (1.613 m), weight 76.2 kg (168 lb), SpO2 97 %.  PHYSICAL EXAMINATION:   Physical Exam  GENERAL:  83 y.o.-year-old  patient lying in the bed with no acute distress.  EYES: Pupils equal, round, reactive to light and accommodation. No scleral icterus. Extraocular muscles intact.  HEENT: Head atraumatic, normocephalic. Oropharynx and nasopharynx clear.  NECK:  Supple, no jugular venous distention. No thyroid enlargement, no tenderness.  LUNGS: Normal breath sounds bilaterally, no wheezing, rales, rhonchi. No use of accessory muscles of respiration.  CARDIOVASCULAR: S1, S2 normal. No murmurs, rubs, or gallops.  ABDOMEN: Soft, nontender, distended.Occasional Bowel sounds present. No organomegaly or mass. Surgical incision EXTREMITIES: No cyanosis, clubbing or edema b/l.    NEUROLOGIC: Cranial nerves II through XII are intact. No focal Motor or sensory deficits b/l.   PSYCHIATRIC:  patient is alert and oriented x 3.  SKIN: No obvious rash, lesion, or ulcer.   LABORATORY PANEL:  CBC  Recent Labs Lab 06/06/16 0448  WBC 8.0  HGB 9.4*  HCT 28.3*  PLT 193    Chemistries   Recent Labs Lab 06/05/16 1533  NA 135  K 3.6  CL 103  CO2 19*  GLUCOSE 166*  BUN 26*  CREATININE 1.19*  CALCIUM 8.3*  AST 20  ALT 13*  ALKPHOS 47  BILITOT 0.6   Cardiac Enzymes No results for input(s): TROPONINI in the last 168 hours. RADIOLOGY:  No results found. ASSESSMENT AND PLAN:   83 year old female with chronic medical history of iron deficiency anemia followed up by gastroenterology, recent colonoscopy with a diagnosis of ascending colon cancer. PET scan was negative for metastases. Patient is admitted to the surgical service for colectomy and hospitalist team is consulted for medical management  #Essential hypertension  -continue metoprolol, Hydrochlorothiazide   #Chronic kidney disease monitor renal function  closely. Recent creatinine on 05/07/2016 is at 1.3 with a GFR 33 Avoid nephrotoxins  -1.36--1.50--1.19  #Chronic iron deficiency anemia probably secondary to colon cancer and status post colectomy. POD #  3 -CLD---advancing diet per surgery PT --- recommended home health PT  Doing well  -#History of gastric ulcer-PPI  #History of coronary artery disease-patient is asymptomatic. - Continue aspirin 81 mg, statin and beta blockers  CSW for d/c planning  Spoke with dters in the room  Will sign off call if needed. Thank you  Case discussed with Care Management/Social Worker. Management plans discussed with the patient, family and they are in agreement.  CODE STATUS: full  DVT Prophylaxis: lovenox  TOTAL TIME TAKING CARE OF THIS PATIENT: 30 minutes.  >50% time spent on counselling and coordination of care  POSSIBLE D/C IN 1-2 DAYS, DEPENDING ON CLINICAL CONDITION.  Note: This dictation was prepared with Dragon dictation along with smaller phrase technology. Any transcriptional errors that result from this process are unintentional.  Shayden Gingrich M.D on 06/08/2016 at 12:32 PM  Between 7am to 6pm - Pager - [phone]  After 6pm go to [email or web address] - password [email or web address] [facility]  Office  [phone]  CC: Primary care physician; Lynnell Jude, MD

## 2016-06-09 MED ORDER — HYDROCODONE-ACETAMINOPHEN 5-325 MG PO TABS: 1.0000 | ORAL_TABLET | ORAL | 0 refills | Status: DC | PRN

## 2016-06-09 MED ORDER — METOPROLOL TARTRATE 25 MG PO TABS: 25.0000 mg | ORAL_TABLET | Freq: Two times a day (BID) | ORAL | Status: DC

## 2016-06-09 NOTE — Final Progress Note (Signed)
Discussed w/ medicine and CSW. OK for discharge.

## 2016-06-09 NOTE — Care Management (Signed)
Patient status post colon resection.  Patient to discharge today.  Patient lives at home alone, however a family member will be staying with her after discharge.  Patient has 2 walkers and a cane in the home for ambulation.  Adult children live locally for support.  PCP [facility].  Pharmacy [facility].  PT has recommended home health PT.  Patient was provided home health agency preference.  Patient states that she does not have an agency preference.  Referral made to [facility] with [facility].  RNCM signing off.

## 2016-06-09 NOTE — Care Management Important Message (Signed)
Important Message  Patient Details  Name: Alexis Haas MRN: [id number] Date of Birth: July 23, 1917   Medicare Important Message Given:  Yes    Beverly Sessions, RN 06/09/2016, 2:13 PM

## 2016-06-09 NOTE — Progress Notes (Signed)
Pt to be discharged per MD order. IV removed. Instructions reviewed with pt and family. HH/Pt set up via [place] with CM. Pt to be discharged in wheelchair.

## 2016-06-09 NOTE — Progress Notes (Signed)
AVSS. Tolerating diet well. Had some unusual visual disturbances, not truly postural in nature. No SOB/CP/ abdominal pain. BM x 2 yesterday. Lungs: Clear. Cardio: RR. ABD: Soft. BS: Normal. Extrem: Soft. Wounds: Clean. Plan: Discuss discharge w/ CSW and medicine.

## 2016-06-10 ENCOUNTER — Telehealth: Payer: Self-pay | Admitting: General Surgery

## 2016-06-10 NOTE — Telephone Encounter (Signed)
Daughter notified of pathology results. Dr. Mike Gip will discuss w/ patient and family at f/u about potential for oral chemo agent vs observation.  Patient doing well by daughter's report: no pain, + BM, voiding well, ambulating in the home.  F/U next week as scheduled.

## 2016-06-11 DIAGNOSIS — Z7982 Long term (current) use of aspirin: Secondary | ICD-10-CM | POA: Diagnosis not present

## 2016-06-11 DIAGNOSIS — Z483 Aftercare following surgery for neoplasm: Secondary | ICD-10-CM | POA: Diagnosis not present

## 2016-06-11 DIAGNOSIS — D631 Anemia in chronic kidney disease: Secondary | ICD-10-CM | POA: Diagnosis not present

## 2016-06-11 DIAGNOSIS — D500 Iron deficiency anemia secondary to blood loss (chronic): Secondary | ICD-10-CM | POA: Diagnosis not present

## 2016-06-11 DIAGNOSIS — I2510 Atherosclerotic heart disease of native coronary artery without angina pectoris: Secondary | ICD-10-CM | POA: Diagnosis not present

## 2016-06-11 DIAGNOSIS — I129 Hypertensive chronic kidney disease with stage 1 through stage 4 chronic kidney disease, or unspecified chronic kidney disease: Secondary | ICD-10-CM | POA: Diagnosis not present

## 2016-06-11 DIAGNOSIS — N189 Chronic kidney disease, unspecified: Secondary | ICD-10-CM | POA: Diagnosis not present

## 2016-06-11 DIAGNOSIS — M1711 Unilateral primary osteoarthritis, right knee: Secondary | ICD-10-CM | POA: Diagnosis not present

## 2016-06-11 DIAGNOSIS — K219 Gastro-esophageal reflux disease without esophagitis: Secondary | ICD-10-CM | POA: Diagnosis not present

## 2016-06-11 DIAGNOSIS — M1712 Unilateral primary osteoarthritis, left knee: Secondary | ICD-10-CM | POA: Diagnosis not present

## 2016-06-11 DIAGNOSIS — C182 Malignant neoplasm of ascending colon: Secondary | ICD-10-CM | POA: Diagnosis not present

## 2016-06-11 LAB — SURGICAL PATHOLOGY

## 2016-06-11 NOTE — Discharge Summary (Signed)
Physician Discharge Summary  Patient ID: Alexis Haas MRN: [id number] DOB/AGE: Feb 06, 1918 83 y.o.  Admit date: 06/05/2016 Discharge date: 06/11/2016  Admission Diagnoses: Colon cancer  Discharge Diagnoses:  Active Problems:   Cancer of ascending colon [facility])   Discharged Condition: good  Hospital Course: Patient underwent laparoscopically assisted right hemicolectomy on the day of admission. She was started on a clear liquid diet which she tolerated well advanced with soft diet. Scant pain after surgery. Early ambulation. Lovenox for DVT prevention. Medical assessment for ongoing management of hypertension.  Consults: Hospitalist  Significant Diagnostic Studies: Pathology showed a T3, N1a near obstructing carcinoma of the hepatic flexure. No microsatellite instability.  Treatments: surgery: Right hemicolectomy.  Discharge Exam: Blood pressure (!) 166/62, pulse 66, temperature 98 F (36.7 C), temperature source Oral, resp. rate 20, height 5' 3.5" (1.613 m), weight 168 lb (76.2 kg), SpO2 98 %. General appearance: alert Resp: clear to auscultation bilaterally Cardio: regular rate and rhythm, S1, S2 normal, no murmur, click, rub or gallop GI: soft, non-tender; bowel sounds normal; no masses,  no organomegaly Neurologic: Alert and oriented X 3, normal strength and tone. Normal symmetric reflexes. Normal coordination and gait Incision/Wound: Clean and dry  Disposition: 01-Home or Self Care  Discharge Instructions    Diet - low sodium heart healthy    Complete by:  As directed    Discharge instructions    Complete by:  As directed    Diet as tolerated.  Tylenol: If needed for soreness.  Aleve: Once a day if needed.  Norco (Hydrocodone): If needed for pain.  Start with one tablet.  Laxative of choice if needed.  OK to shower.   Increase activity slowly    Complete by:  As directed      Allergies as of 06/09/2016      Reactions   Amoxicillin Rash   Has patient had a PCN  reaction causing immediate rash, facial/tongue/throat swelling, SOB or lightheadedness with hypotension:Yes Has patient had a PCN reaction causing severe rash involving mucus membranes or skin necrosis:unsure Has patient had a PCN reaction that required hospitalization:No Has patient had a PCN reaction occurring within the last 10 years:No If all of the above answers are "NO", then may proceed with Cephalosporin use.      Medication List    STOP taking these medications   ALEVE PM 220-25 MG Tabs Generic drug:  Naproxen Sod-Diphenhydramine   metroNIDAZOLE 500 MG tablet Commonly known as:  FLAGYL   neomycin 500 MG tablet Commonly known as:  MYCIFRADIN   polyethylene glycol powder powder Commonly known as:  GLYCOLAX/MIRALAX     TAKE these medications   aspirin EC 81 MG tablet Take 81 mg by mouth daily.   hydrochlorothiazide 25 MG tablet Commonly known as:  HYDRODIURIL Take 25 mg by mouth daily.   HYDROcodone-acetaminophen 5-325 MG tablet Commonly known as:  NORCO/VICODIN Take 1-2 tablets by mouth every 4 (four) hours as needed for moderate pain.   lovastatin 40 MG tablet Commonly known as:  MEVACOR Take 40 mg by mouth at bedtime.   metoprolol tartrate 25 MG tablet Commonly known as:  LOPRESSOR Take 25 mg by mouth 2 (two) times daily.   naproxen sodium 220 MG tablet Commonly known as:  ANAPROX Take 220 mg by mouth daily as needed (for pain).   omeprazole 20 MG capsule Commonly known as:  PRILOSEC Take 20 mg by mouth at bedtime.      Follow-up Information    Robert Bellow,  MD Follow up in 1 week(s).   Specialties:  General Surgery, Radiology Why:  Monday, May 7 @ 4:15.PM Contact information: [address] [place] [address] [phone]           Signed: Robert Bellow 06/11/2016, 4:53 PM

## 2016-06-12 DIAGNOSIS — D500 Iron deficiency anemia secondary to blood loss (chronic): Secondary | ICD-10-CM | POA: Diagnosis not present

## 2016-06-12 DIAGNOSIS — M1711 Unilateral primary osteoarthritis, right knee: Secondary | ICD-10-CM | POA: Diagnosis not present

## 2016-06-12 DIAGNOSIS — M1712 Unilateral primary osteoarthritis, left knee: Secondary | ICD-10-CM | POA: Diagnosis not present

## 2016-06-12 DIAGNOSIS — I2510 Atherosclerotic heart disease of native coronary artery without angina pectoris: Secondary | ICD-10-CM | POA: Diagnosis not present

## 2016-06-12 DIAGNOSIS — I129 Hypertensive chronic kidney disease with stage 1 through stage 4 chronic kidney disease, or unspecified chronic kidney disease: Secondary | ICD-10-CM | POA: Diagnosis not present

## 2016-06-12 DIAGNOSIS — N189 Chronic kidney disease, unspecified: Secondary | ICD-10-CM | POA: Diagnosis not present

## 2016-06-12 DIAGNOSIS — Z7982 Long term (current) use of aspirin: Secondary | ICD-10-CM | POA: Diagnosis not present

## 2016-06-12 DIAGNOSIS — D631 Anemia in chronic kidney disease: Secondary | ICD-10-CM | POA: Diagnosis not present

## 2016-06-12 DIAGNOSIS — K219 Gastro-esophageal reflux disease without esophagitis: Secondary | ICD-10-CM | POA: Diagnosis not present

## 2016-06-12 DIAGNOSIS — Z483 Aftercare following surgery for neoplasm: Secondary | ICD-10-CM | POA: Diagnosis not present

## 2016-06-12 DIAGNOSIS — C182 Malignant neoplasm of ascending colon: Secondary | ICD-10-CM | POA: Diagnosis not present

## 2016-06-16 ENCOUNTER — Ambulatory Visit (INDEPENDENT_AMBULATORY_CARE_PROVIDER_SITE_OTHER): Payer: PPO | Admitting: General Surgery

## 2016-06-16 ENCOUNTER — Encounter: Payer: Self-pay | Admitting: General Surgery

## 2016-06-16 VITALS — BP 120/64 | HR 54 | Resp 14 | Ht 63.0 in | Wt 166.0 lb

## 2016-06-16 DIAGNOSIS — C182 Malignant neoplasm of ascending colon: Secondary | ICD-10-CM

## 2016-06-16 NOTE — Patient Instructions (Signed)
Return in one month.  

## 2016-06-16 NOTE — Progress Notes (Signed)
Patient ID: Alexis Haas, female   DOB: November 22, 1917, 83 y.o.   MRN: [id number]  Chief Complaint  Patient presents with  . Routine Post Op    HPI Alexis Haas is a 83 y.o. female here today for her post op right colectomy done on 06/05/2016. Patient states she is doing well. Patient reports an family confirms that she's been eating well. No difficulty with constipation. Occasional increased episodes of urinary incontinence. The patient did require repeat bladder catheterization in the 24-48 hour window after surgery for retention. She is here today with her son, Lowanda Cashaw and daughter Theodora Blow.   Marland KitchenHPI  Past Medical History:  Diagnosis Date  . Anemia   . Arthritis   . Cancer ([place]) 04/15/2016   ADENOCARCINOMA. transverse colon  . Chronic kidney disease    LABS 3/18 COMPARED WITH 1 YEAR AGO  . Coronary artery disease   . Fall 2018  . GERD (gastroesophageal reflux disease)   . Heart disease   . Hypercholesteremia   . Hypertension   . Insomnia     Past Surgical History:  Procedure Laterality Date  . CARDIAC SURGERY  2005   bypass  . COLONOSCOPY WITH PROPOFOL N/A 04/15/2016   Procedure: COLONOSCOPY WITH PROPOFOL;  Surgeon: Lucilla Lame, MD;  Location: [facility] ENDOSCOPY;  Service: Endoscopy;  Laterality: N/A;  . CORONARY ARTERY BYPASS GRAFT  2005   x 2  . ESOPHAGOGASTRODUODENOSCOPY (EGD) WITH PROPOFOL N/A 04/15/2016   Procedure: ESOPHAGOGASTRODUODENOSCOPY (EGD) WITH PROPOFOL;  Surgeon: Lucilla Lame, MD;  Location: [facility] ENDOSCOPY;  Service: Endoscopy;  Laterality: N/A;  . LAPAROSCOPIC RIGHT COLECTOMY Right 06/05/2016   Procedure: LAPAROSCOPIC RIGHT COLECTOMY;  Surgeon: Robert Bellow, MD;  Location: [facility] ORS;  Service: General;  Laterality: Right;  . toe removal    . TONSILLECTOMY     age 67  . UMBILICAL HERNIA REPAIR  06/05/2016   Procedure: HERNIA REPAIR UMBILICAL ADULT;  Surgeon: Robert Bellow, MD;  Location: [facility] ORS;  Service: General;;    No family history on  file.  Social History Social History  Substance Use Topics  . Smoking status: Never Smoker  . Smokeless tobacco: Never Used  . Alcohol use No    Allergies  Allergen Reactions  . Amoxicillin Rash    Has patient had a PCN reaction causing immediate rash, facial/tongue/throat swelling, SOB or lightheadedness with hypotension:Yes Has patient had a PCN reaction causing severe rash involving mucus membranes or skin necrosis:unsure Has patient had a PCN reaction that required hospitalization:No Has patient had a PCN reaction occurring within the last 10 years:No If all of the above answers are "NO", then may proceed with Cephalosporin use.     Current Outpatient Prescriptions  Medication Sig Dispense Refill  . aspirin EC 81 MG tablet Take 81 mg by mouth daily.    . hydrochlorothiazide (HYDRODIURIL) 25 MG tablet Take 25 mg by mouth daily.    Marland Kitchen lovastatin (MEVACOR) 40 MG tablet Take 40 mg by mouth at bedtime.    . metoprolol tartrate (LOPRESSOR) 25 MG tablet Take 25 mg by mouth 2 (two) times daily.    . naproxen sodium (ANAPROX) 220 MG tablet Take 220 mg by mouth daily as needed (for pain).     Marland Kitchen omeprazole (PRILOSEC) 20 MG capsule Take 20 mg by mouth at bedtime.      No current facility-administered medications for this visit.     Review of Systems Review of Systems  Constitutional: Negative.   Respiratory: Negative.  Cardiovascular: Negative.     Blood pressure 120/64, pulse (!) 54, resp. rate 14, height '5\' 3"'  (1.6 m), weight 166 lb (75.3 kg). The patient's weight is down 5 pounds from her preop value on 05/21/2016.  Physical Exam Physical Exam  Constitutional: She is oriented to person, place, and time. She appears well-developed and well-nourished.  Cardiovascular: Normal rate, regular rhythm and normal heart sounds.   Pulmonary/Chest: Effort normal and breath sounds normal.  Abdominal: Normal appearance.    Neurological: She is alert and oriented to person, place, and  time.  Skin: Skin is warm and dry.    Data Reviewed Pathology review: DIAGNOSIS:  A. RIGHT COLON; LAPAROSCOPIC-ASSISTED RIGHT HEMICOLECTOMY:  - ADENOCARCINOMA, MODERATELY DIFFERENTIATED.  - ONE OF TWENTY-EIGHT NODES INVOLVED BY METASTASIS (1/28).  - UNREMARKABLE OMENTAL TISSUE.  - APPENDIX WITH FIBROUS OBLITERATION OF THE DISTAL TIP.  - THE SURGICAL MARGINS ARE NEGATIVE.  MICROSATELLITE INSTABILITY IMMUNOHISTOCHEMISTRY  MISMATCH REPAIR PROTEINS:   MLH1: Intact nuclear expression  MSH2: Intact nuclear expression  MSH6: Intact nuclear expression  PMS2: Intact nuclear expression   Interpretation: No loss of nuclear expression of mismatch repair  proteins: Low probability of MSI-H.   Assessment    Doing exceptionally well now 11 days post right hemicolectomy.    Plan    The patient has an appointment on 06/25/2016 with medical oncology to review options for additional treatment.    A repeat CEA would be appropriate at the time of her medical oncology assessment to determine if her CEA level falls as anticipated.  Patient to return in one month.     HPI, Physical Exam, Assessment and Plan have been scribed under the direction and in the presence of Hervey Ard, MD.  Gaspar Cola, CMA  I have completed the exam and reviewed the above documentation for accuracy and completeness.  I agree with the above.  Haematologist has been used and any errors in dictation or transcription are unintentional.  Hervey Ard, M.D., F.A.C.S.  Robert Bellow 06/16/2016, 6:08 PM

## 2016-06-25 ENCOUNTER — Telehealth: Payer: Self-pay | Admitting: *Deleted

## 2016-06-25 ENCOUNTER — Inpatient Hospital Stay: Payer: PPO

## 2016-06-25 ENCOUNTER — Inpatient Hospital Stay: Payer: PPO | Attending: Hematology and Oncology | Admitting: Hematology and Oncology

## 2016-06-25 VITALS — BP 129/69 | HR 54 | Temp 98.0°F | Resp 18 | Wt 165.0 lb

## 2016-06-25 DIAGNOSIS — K219 Gastro-esophageal reflux disease without esophagitis: Secondary | ICD-10-CM | POA: Diagnosis not present

## 2016-06-25 DIAGNOSIS — E7800 Pure hypercholesterolemia, unspecified: Secondary | ICD-10-CM | POA: Diagnosis not present

## 2016-06-25 DIAGNOSIS — K259 Gastric ulcer, unspecified as acute or chronic, without hemorrhage or perforation: Secondary | ICD-10-CM | POA: Diagnosis not present

## 2016-06-25 DIAGNOSIS — C184 Malignant neoplasm of transverse colon: Secondary | ICD-10-CM

## 2016-06-25 DIAGNOSIS — I129 Hypertensive chronic kidney disease with stage 1 through stage 4 chronic kidney disease, or unspecified chronic kidney disease: Secondary | ICD-10-CM | POA: Diagnosis not present

## 2016-06-25 DIAGNOSIS — N182 Chronic kidney disease, stage 2 (mild): Secondary | ICD-10-CM

## 2016-06-25 DIAGNOSIS — C182 Malignant neoplasm of ascending colon: Secondary | ICD-10-CM

## 2016-06-25 DIAGNOSIS — I2510 Atherosclerotic heart disease of native coronary artery without angina pectoris: Secondary | ICD-10-CM | POA: Diagnosis not present

## 2016-06-25 DIAGNOSIS — G4700 Insomnia, unspecified: Secondary | ICD-10-CM | POA: Diagnosis not present

## 2016-06-25 DIAGNOSIS — K449 Diaphragmatic hernia without obstruction or gangrene: Secondary | ICD-10-CM | POA: Diagnosis not present

## 2016-06-25 DIAGNOSIS — D500 Iron deficiency anemia secondary to blood loss (chronic): Secondary | ICD-10-CM | POA: Diagnosis not present

## 2016-06-25 DIAGNOSIS — Z79899 Other long term (current) drug therapy: Secondary | ICD-10-CM | POA: Diagnosis not present

## 2016-06-25 DIAGNOSIS — Z951 Presence of aortocoronary bypass graft: Secondary | ICD-10-CM | POA: Insufficient documentation

## 2016-06-25 DIAGNOSIS — Z9049 Acquired absence of other specified parts of digestive tract: Secondary | ICD-10-CM | POA: Diagnosis not present

## 2016-06-25 DIAGNOSIS — Z7982 Long term (current) use of aspirin: Secondary | ICD-10-CM | POA: Diagnosis not present

## 2016-06-25 DIAGNOSIS — M1990 Unspecified osteoarthritis, unspecified site: Secondary | ICD-10-CM | POA: Diagnosis not present

## 2016-06-25 LAB — CBC WITH DIFFERENTIAL/PLATELET
Basophils Absolute: 0.1 10*3/uL (ref 0–0.1)
Basophils Relative: 1 %
Eosinophils Absolute: 0.4 10*3/uL (ref 0–0.7)
Eosinophils Relative: 7 %
HCT: 34 % — ABNORMAL LOW (ref 35.0–47.0)
Hemoglobin: 11.4 g/dL — ABNORMAL LOW (ref 12.0–16.0)
Lymphocytes Relative: 18 %
Lymphs Abs: 1.2 10*3/uL (ref 1.0–3.6)
MCH: 27.8 pg (ref 26.0–34.0)
MCHC: 33.4 g/dL (ref 32.0–36.0)
MCV: 83.2 fL (ref 80.0–100.0)
Monocytes Absolute: 0.7 10*3/uL (ref 0.2–0.9)
Monocytes Relative: 11 %
Neutro Abs: 4.1 10*3/uL (ref 1.4–6.5)
Neutrophils Relative %: 63 %
Platelets: 255 10*3/uL (ref 150–440)
RBC: 4.08 MIL/uL (ref 3.80–5.20)
RDW: 16.9 % — ABNORMAL HIGH (ref 11.5–14.5)
WBC: 6.5 10*3/uL (ref 3.6–11.0)

## 2016-06-25 LAB — BASIC METABOLIC PANEL
Anion gap: 11 (ref 5–15)
BUN: 42 mg/dL — ABNORMAL HIGH (ref 6–20)
CO2: 24 mmol/L (ref 22–32)
Calcium: 8.6 mg/dL — ABNORMAL LOW (ref 8.9–10.3)
Chloride: 100 mmol/L — ABNORMAL LOW (ref 101–111)
Creatinine, Ser: 1.55 mg/dL — ABNORMAL HIGH (ref 0.44–1.00)
GFR calc Af Amer: 31 mL/min — ABNORMAL LOW (ref >60)
GFR calc non Af Amer: 27 mL/min — ABNORMAL LOW (ref >60)
Glucose, Bld: 96 mg/dL (ref 65–99)
Potassium: 3.9 mmol/L (ref 3.5–5.1)
Sodium: 135 mmol/L (ref 135–145)

## 2016-06-25 LAB — FERRITIN: Ferritin: 21 ng/mL (ref 11–307)

## 2016-06-25 NOTE — Telephone Encounter (Signed)
Called patient and LVM that hct is improved.  Creatnine is elevated.  Patient advised to increase her fluids.

## 2016-06-25 NOTE — Progress Notes (Signed)
[facility] day:  06/25/2016  Chief Complaint: Alexis Haas is a 83 y.o. female with adenocarcinoma of the transverse colon and associated iron deficiency anemia who is seen assessment after interval colectomy.  HPI:  The patient was last seen in the medical oncology clinic on 05/21/2016.  At that time, PET scan was reviewed.  She was scheduled to meet with Dr. Bary Castilla to discuss surgery.  She underwent laparoscopic assisted right hemicolectomy with ileotransverse colostomy on 06/05/2016.  Pathology revealed a 3.7 cm moderately differentaited adenocarcinoma.  Tumor invaded the muscularis propria into the pericolorectal tissue.  One of 28 lymph nodes were positive.  Margins were negative.  Pathologic stage was  T3N1a (stage III).  MSI/MMR was intact.  Symptomatically, she is feeling back to normal. She is eating. She notes arthritis in her knees.  She denies any melena or hematochezia.   Past Medical History:  Diagnosis Date  . Anemia   . Arthritis   . Cancer ([place]) 04/15/2016   ADENOCARCINOMA. transverse colon  . Chronic kidney disease    LABS 3/18 COMPARED WITH 1 YEAR AGO  . Coronary artery disease   . Fall 2018  . GERD (gastroesophageal reflux disease)   . Heart disease   . Hypercholesteremia   . Hypertension   . Insomnia     Past Surgical History:  Procedure Laterality Date  . CARDIAC SURGERY  2005   bypass  . COLONOSCOPY WITH PROPOFOL N/A 04/15/2016   Procedure: COLONOSCOPY WITH PROPOFOL;  Surgeon: Lucilla Lame, MD;  Location: [facility] ENDOSCOPY;  Service: Endoscopy;  Laterality: N/A;  . CORONARY ARTERY BYPASS GRAFT  2005   x 2  . ESOPHAGOGASTRODUODENOSCOPY (EGD) WITH PROPOFOL N/A 04/15/2016   Procedure: ESOPHAGOGASTRODUODENOSCOPY (EGD) WITH PROPOFOL;  Surgeon: Lucilla Lame, MD;  Location: [facility] ENDOSCOPY;  Service: Endoscopy;  Laterality: N/A;  . LAPAROSCOPIC RIGHT COLECTOMY Right 06/05/2016   Procedure: LAPAROSCOPIC RIGHT COLECTOMY;   Surgeon: Robert Bellow, MD;  Location: [facility] ORS;  Service: General;  Laterality: Right;  . toe removal    . TONSILLECTOMY     age 10  . UMBILICAL HERNIA REPAIR  06/05/2016   Procedure: HERNIA REPAIR UMBILICAL ADULT;  Surgeon: Robert Bellow, MD;  Location: [facility] ORS;  Service: General;;    No family history on file.  Social History:  reports that she has never smoked. She has never used smokeless tobacco. She reports that she does not drink alcohol or use drugs.  She states that she has worked her whole life (since the age of 49).  She works at [employer] part time.  She is head of the shoe department.  She lives in [place] at [place].  She lives alone.  Her husband died 38 years ago.  Her daughter is Theodora Blow (home: [phone]; work: [phone]).  The patient is accompanied by her daughter  And oldest son, Chrissie Noa, today.  Allergies:  Allergies  Allergen Reactions  . Amoxicillin Rash    Has patient had a PCN reaction causing immediate rash, facial/tongue/throat swelling, SOB or lightheadedness with hypotension:Yes Has patient had a PCN reaction causing severe rash involving mucus membranes or skin necrosis:unsure Has patient had a PCN reaction that required hospitalization:No Has patient had a PCN reaction occurring within the last 10 years:No If all of the above answers are "NO", then may proceed with Cephalosporin use.     Current Medications: Current Outpatient Prescriptions  Medication Sig Dispense Refill  . aspirin EC 81 MG tablet  Take 81 mg by mouth daily.    . hydrochlorothiazide (HYDRODIURIL) 25 MG tablet Take 25 mg by mouth daily.    Marland Kitchen lovastatin (MEVACOR) 40 MG tablet Take 40 mg by mouth at bedtime.    . metoprolol tartrate (LOPRESSOR) 25 MG tablet Take 25 mg by mouth 2 (two) times daily.    . naproxen sodium (ANAPROX) 220 MG tablet Take 220 mg by mouth daily as needed (for pain).     Marland Kitchen omeprazole (PRILOSEC) 20 MG capsule Take 20 mg by mouth at bedtime.       No current facility-administered medications for this visit.     Review of Systems:  GENERAL:  Feels "alright".  No fevers or sweats.  Weight up 1 pound. PERFORMANCE STATUS (ECOG):  1 HEENT:  No visual changes, sore throat, mouth sores or tenderness. Lungs: No shortness of breath or cough.  No hemoptysis. Cardiac:  No chest pain, palpitations, orthopnea, or PND. GI:  Constipation.  No nausea, vomiting, diarrhea, melena or hematochezia. GU:  No urgency, frequency, dysuria, or hematuria. Musculoskeletal:  No back pain.  Arthritis in left knee (wears brace).  Can't lift right arm fully up secondary to arthritis.  No muscle tenderness. Extremities:  No pain or swelling. Skin:  No rashes or skin changes. Neuro:  Forgetful.  No headache, numbness or weakness, balance or coordination issues. Endocrine:  No diabetes, thyroid issues, hot flashes or night sweats. Psych:  No mood changes, depression or anxiety. Pain:  No focal pain. Review of systems:  All other systems reviewed and found to be negative.  Physical Exam: Blood pressure 129/69, pulse (!) 54, temperature 98 F (36.7 C), temperature source Tympanic, resp. rate 18, weight 165 lb (74.8 kg). GENERAL:  Elderly woman sitting comfortably in the exam room in no acute distress.  She has a rolling walker at her side. MENTAL STATUS:  Alert and oriented to person, place and time. HEAD:  Curly gray hair. Normocephalic, face symmetric, no Cushingoid features. EYES:  Glasses.  Blue eyes.  No conjunctivitis or scleral icterus. EXTREMITIES: Arthritic changes in hands.  Left knee brace.  Chronic 1+ bilateral ankle edema (left > right).  NEUROLOGICAL: Unremarkable. PSYCH:  Appropriate.   No visits with results within 3 Day(s) from this visit.  Latest known visit with results is:  Admission on 06/05/2016, Discharged on 06/09/2016  Component Date Value Ref Range Status  . SURGICAL PATHOLOGY 06/05/2016    Final-Edited                    Value:Surgical Pathology THIS IS AN ADDENDUM REPORT CASE: [id number] PATIENT: Alexis Haas Surgical Pathology Report Addendum  Reason for Addendum #1:  Immunohistochemistry results  SPECIMEN SUBMITTED: A. Colon, right  CLINICAL HISTORY: None provided  PRE-OPERATIVE DIAGNOSIS: Right colon cancer  POST-OPERATIVE DIAGNOSIS: Same as pre-op     DIAGNOSIS: A. RIGHT COLON; LAPAROSCOPIC-ASSISTED RIGHT HEMICOLECTOMY: - ADENOCARCINOMA, MODERATELY DIFFERENTIATED. - ONE OF TWENTY-EIGHT NODES INVOLVED BY METASTASIS (1/28). - UNREMARKABLE OMENTAL TISSUE. - APPENDIX  WITH FIBROUS OBLITERATION OF THE DISTAL TIP. - THE SURGICAL MARGINS ARE NEGATIVE. - SEE SUMMARY BELOW.  Surgical Pathology Cancer Case Summary  COLON AND RECTUM: Procedure: Laparoscopic assisted right hemicolectomy Tumor Site:  Right colon Tumor Size: Greatest dimension: 3.7 cm Macroscopic Tumor Perforation: Not specified Histologic Type:  Adenocarcinoma Histologic Gra                         de:  G2, moderately differentiated Tumor  Extension: Tumor invades through muscularis propria into pericolorectal tissue Margins:  All margins are uninvolved by invasive carcinoma, high-grade dysplasia, intramucosal adenocarcinoma, and adenoma Treatment Effect:  No known presurgical therapy Lymphovascular Invasion: Not identified Perineural Invasion: Not identified Tumor Deposits: Not identified Regional Lymph Nodes: Number involved: 1; Number examined: 28 Pathologic Stage Classification (pTNM, AJCC 8th Edition): pT3 pN1a TNM Descriptors: Not applicable  GROSS DESCRIPTION:  A. Labeled: right colon Type of procedure: right colectomy Measurement: ileum-6.3 centimeter long and 2.5 cm in diameter; colon 17.6 cm long and 10 cm in circumference; appendix 5.9 cm long and 0.5 cm in diameter Specimen integrity (rectal resections): received open Orientation: radial-orange Remaining external surface-blue Number of mass(es):  1 Location of mass(es): 8.5 cm distal to ileocecal                          valve Size(s) of mass(es): annular, 3.5 x 3.7 cm with adjacent surgical ink Description of mass(es): annular centrally depressed tan to red Bowel circumference at site of mass(es): 3.7 cm Gross depth of invasion: completely effacing muscle wall with adherent and retracted fibrous tissue Macroscopic perforation: cannot be determined, received open Margins:      Proximal: 13.1 cm Distal: 5.6 cm Circumferential (Radial): 3.0 cm Mesenteric: 3.0 cm  Luminal obstruction: 70% Other remarkable findings: also unattached 21.3 x 9.0 x 0.5 cm fragment of omentum Lymph nodes: identified Block summary: 1-en face proximal margin 2-en face distal margin 3-perpendicular mesenteric/radial margin 4-8-representative tumor 9-representative cross-sections and longitudinal tip of appendix 10-6 lymph nodes 11-6 lymph nodes 12-6 lymph node 13-5 lymph node 14-4 lymph node 15-sectioned and submitted grossly positive lymph node 16-representative omentum  Final Diagnosis performed b                         y Delorse Lek, MD.  Electronically signed 06/10/2016 12:56:21PM   The electronic signature indicates that the named Attending Pathologist has evaluated the specimen  Technical component performed at [facility], [address], [place], [place] [address] Lab: [phone] Dir: Darrick Penna. Evette Doffing, MD  Professional component performed at [facility], [facility], [place], [place], [place] [address] Lab: [phone] Dir: Dellia Nims. Rubinas, MD   ADDENDUM:  MICROSATELLITE INSTABILITY IMMUNOHISTOCHEMISTRY MISMATCH REPAIR PROTEINS:  MLH1: Intact nuclear expression MSH2: Intact nuclear expression MSH6: Intact nuclear expression PMS2: Intact nuclear expression  Interpretation: No loss of nuclear expression of mismatch repair proteins: Low probability of MSI-H.  Note: IHC slides were prepared by [facility] [place], [place] and interpreted by Dr. Luana Shu. All controls stained appropriately.      Addendum #1 performed b                         y Delorse Lek, MD.  Electronically signed 06/11/2016 2:04:40PM    Technical component performed at [facility], [address], [place], [place] [address] Lab: [phone] Dir: Darrick Penna. Evette Doffing, MD  Professional component performed at [facility], [facility], [place], [place], [place] [address] Lab: [phone] Dir: Dellia Nims. Rubinas, MD    . Sodium 06/05/2016 135  135 - 145 mmol/L Final  . Potassium 06/05/2016 3.6  3.5 - 5.1 mmol/L Final  . Chloride 06/05/2016 103  101 - 111 mmol/L Final  . CO2 06/05/2016 19* 22 - 32 mmol/L Final  . Glucose, Bld 06/05/2016 166* 65 - 99 mg/dL Final  .  BUN 06/05/2016 26* 6 - 20 mg/dL Final  . Creatinine, Ser 06/05/2016 1.19* 0.44 - 1.00 mg/dL Final  . Calcium 06/05/2016 8.3* 8.9 - 10.3 mg/dL Final  . Total Protein 06/05/2016 6.1* 6.5 - 8.1 g/dL Final  . Albumin 06/05/2016 3.4* 3.5 - 5.0 g/dL Final  . AST 06/05/2016 20  15 - 41 U/L Final  . ALT 06/05/2016 13* 14 - 54 U/L Final  . Alkaline Phosphatase 06/05/2016 47  38 - 126 U/L Final  . Total Bilirubin 06/05/2016 0.6  0.3 - 1.2 mg/dL Final  . GFR calc non Af Amer 06/05/2016 37* >60 mL/min Final  . GFR calc Af Amer 06/05/2016 43* >60 mL/min Final   Comment: (NOTE) The eGFR has been calculated using the CKD EPI equation. This calculation has not been validated in all clinical situations. eGFR's persistently <60 mL/min signify possible Chronic Kidney Disease.   . Anion gap 06/05/2016 13  5 - 15 Final  . WBC 06/06/2016 8.0  3.6 - 11.0 K/uL Final  . RBC 06/06/2016 3.40* 3.80 - 5.20 MIL/uL Final  . Hemoglobin 06/06/2016 9.4* 12.0 - 16.0 g/dL Final  . HCT 06/06/2016 28.3* 35.0 - 47.0 % Final  . MCV 06/06/2016 83.2  80.0 - 100.0 fL Final  . MCH 06/06/2016 27.7  26.0 - 34.0 pg Final  . MCHC 06/06/2016 33.3   32.0 - 36.0 g/dL Final  . RDW 06/06/2016 18.8* 11.5 - 14.5 % Final  . Platelets 06/06/2016 193  150 - 440 K/uL Final  . Neutrophils Relative % 06/06/2016 81  % Final  . Neutro Abs 06/06/2016 6.5  1.4 - 6.5 K/uL Final  . Lymphocytes Relative 06/06/2016 9  % Final  . Lymphs Abs 06/06/2016 0.7* 1.0 - 3.6 K/uL Final  . Monocytes Relative 06/06/2016 10  % Final  . Monocytes Absolute 06/06/2016 0.8  0.2 - 0.9 K/uL Final  . Eosinophils Relative 06/06/2016 0  % Final  . Eosinophils Absolute 06/06/2016 0.0  0 - 0.7 K/uL Final  . Basophils Relative 06/06/2016 0  % Final  . Basophils Absolute 06/06/2016 0.0  0 - 0.1 K/uL Final    Assessment:  Alexis Haas is a 83 y.o. 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  Pathology revealed a 3.7 cm moderately differentiated adenocarcinoma.  Tumor invaded the muscularis propria into the pericolorectal tissue.  One of 28 lymph nodes were positive.  Margins were negative.  Pathologic stage was  T3N1a (stage III).  MSI/MMR was intact.  PET scan on 05/19/2016 revealed markedly hypermetabolic lesion in the distal ascending colon extending into the proximal hepatic flexure, consistent with the known primary malignancy.  There was a tiny focus of FDG uptake in the medial right liver, just minimally above the background expected mottled hepatic uptake. CT imaging suggests there may be a tiny 9 mm hypoattenuating focus at this location. While not definite, imaging features to raise concern for the possibility of metastatic disease in the liver.  CEA was 6.4 on 05/07/2016.  She presented with chronic iron deficiency anemia secondary to blood loss.  Invasive procedures (EGD or colonoscopy) were initially deferred secondary to her age.  Guaiac cards were negative (12/2014 and 10/2015) then positive (01/2016).  Diet is modest.  EGD on 04/15/2016 revealed a medium size  hiatal hernia, nonbleeding gastric ulcer with no stigmata of bleeding.  Oral iron is ineffective.  She received 2 units PRBCs on 10/11/2015 for a hematocrit  of 21.1 and hemoglobin 6.4.  She has received Feraheme on several occasions:  01/11/2015, 02/01/2015, 03/15/2015, 03/22/2015, 10/16/2015, 01/09/2016, 03/05/2016, and on 05/09/2016.    Ferritin has been monitored: 5 on 01/02/2015, 36 on 01/31/2105, 26 on 03/08/2015, 184 on 05/02/2015, 7 on 10/10/2015, 91 on 11/07/2015, 7 on 01/02/2016, 14 on 03/03/2016, 33 on 04/07/2016, and 12 on 05/07/2016.  She has chronic mild renal insufficiency.  Creatinine is 1.55 (CrCl 24 ml/min) today.  Symptomatically, she is feeling back to normal.  She denies any melena or hematochezia.  Exam is unremarkable.   Plan: 1.  Discuss diagnosis, staging, and management of colon cancer.  She has stage III disease based on + lymph node .  Discuss unclear significance of focus of tiny 9 mm lesion in medial right liver.  This may reflect metastatic disease.  Discuss plan for follow-up imaging.  Discuss thoughts about systemic chemotherapy.  Xeloda is contraindicated with CrCl < 30 mm/min.  Given her age and renal function, we discussed deferring treatment.  Patient was in agreement. 2.  Labs today:  CBC with diff, BMP, CEA, ferritin. 3.  RTC in 1 month for MD assessment and +/- labs.   Lequita Asal, MD  06/25/2016, 3:31 PM

## 2016-06-25 NOTE — Telephone Encounter (Signed)
-----   Message from Lequita Asal, MD sent at 06/25/2016  4:49 PM EDT ----- Regarding: Please call patient/daughter and FAX to PCP  Hematocrit better.  Creatinine increased.    Increase fluids.  M  ----- Message ----- From: Interface, Lab In [place] Sent: 06/25/2016   4:29 PM To: Lequita Asal, MD

## 2016-06-25 NOTE — Progress Notes (Signed)
Patient offers no concerns today. 

## 2016-06-26 DIAGNOSIS — D631 Anemia in chronic kidney disease: Secondary | ICD-10-CM | POA: Diagnosis not present

## 2016-06-26 DIAGNOSIS — I129 Hypertensive chronic kidney disease with stage 1 through stage 4 chronic kidney disease, or unspecified chronic kidney disease: Secondary | ICD-10-CM | POA: Diagnosis not present

## 2016-06-26 DIAGNOSIS — I2510 Atherosclerotic heart disease of native coronary artery without angina pectoris: Secondary | ICD-10-CM | POA: Diagnosis not present

## 2016-06-26 DIAGNOSIS — Z7982 Long term (current) use of aspirin: Secondary | ICD-10-CM | POA: Diagnosis not present

## 2016-06-26 DIAGNOSIS — K219 Gastro-esophageal reflux disease without esophagitis: Secondary | ICD-10-CM | POA: Diagnosis not present

## 2016-06-26 DIAGNOSIS — M1712 Unilateral primary osteoarthritis, left knee: Secondary | ICD-10-CM | POA: Diagnosis not present

## 2016-06-26 DIAGNOSIS — D500 Iron deficiency anemia secondary to blood loss (chronic): Secondary | ICD-10-CM | POA: Diagnosis not present

## 2016-06-26 DIAGNOSIS — N189 Chronic kidney disease, unspecified: Secondary | ICD-10-CM | POA: Diagnosis not present

## 2016-06-26 DIAGNOSIS — C182 Malignant neoplasm of ascending colon: Secondary | ICD-10-CM | POA: Diagnosis not present

## 2016-06-26 DIAGNOSIS — M1711 Unilateral primary osteoarthritis, right knee: Secondary | ICD-10-CM | POA: Diagnosis not present

## 2016-06-26 DIAGNOSIS — Z483 Aftercare following surgery for neoplasm: Secondary | ICD-10-CM | POA: Diagnosis not present

## 2016-06-26 LAB — CEA: CEA: 4.8 ng/mL — ABNORMAL HIGH (ref 0.0–4.7)

## 2016-07-17 ENCOUNTER — Encounter: Payer: Self-pay | Admitting: General Surgery

## 2016-07-17 ENCOUNTER — Ambulatory Visit (INDEPENDENT_AMBULATORY_CARE_PROVIDER_SITE_OTHER): Payer: PPO | Admitting: General Surgery

## 2016-07-17 VITALS — BP 132/74 | HR 68 | Resp 14 | Ht 63.0 in | Wt 163.0 lb

## 2016-07-17 DIAGNOSIS — C182 Malignant neoplasm of ascending colon: Secondary | ICD-10-CM

## 2016-07-17 NOTE — Progress Notes (Signed)
Patient ID: Alexis Haas, female   DOB: 21-May-1917, 83 y.o.   MRN: [id number]  Chief Complaint  Patient presents with  . Routine Post Op    HPI Alexis Haas is a 83 y.o. female here today for her one month follow up right colectomy done on 06/05/2016. Patient states she is doing well.  HPI  Past Medical History:  Diagnosis Date  . Anemia   . Arthritis   . Cancer ([place]) 04/15/2016   T3, N1a adenocarcinoma of the hepatic flexure. No mismatch repair gene abnormality.   . Chronic kidney disease    LABS 3/18 COMPARED WITH 1 YEAR AGO  . Coronary artery disease   . Fall 2018  . GERD (gastroesophageal reflux disease)   . Heart disease   . Hypercholesteremia   . Hypertension   . Insomnia     Past Surgical History:  Procedure Laterality Date  . CARDIAC SURGERY  2005   bypass  . COLONOSCOPY WITH PROPOFOL N/A 04/15/2016   Procedure: COLONOSCOPY WITH PROPOFOL;  Surgeon: Lucilla Lame, MD;  Location: [facility] ENDOSCOPY;  Service: Endoscopy;  Laterality: N/A;  . CORONARY ARTERY BYPASS GRAFT  2005   x 2  . ESOPHAGOGASTRODUODENOSCOPY (EGD) WITH PROPOFOL N/A 04/15/2016   Procedure: ESOPHAGOGASTRODUODENOSCOPY (EGD) WITH PROPOFOL;  Surgeon: Lucilla Lame, MD;  Location: [facility] END[facility];  Service: Endoscopy;  Laterality: N/A;  . LAPAROSCOPIC RIGHT COLECTOMY Right 06/05/2016   Procedure: LAPAROSCOPIC RIGHT COLECTOMY;  Surgeon: Robert Bellow, MD;  Location: [facility] ORS;  Service: General;  Laterality: Right;  . toe removal    . TONSILLECTOMY     age 73  . UMBILICAL HERNIA REPAIR  06/05/2016   Procedure: HERNIA REPAIR UMBILICAL ADULT;  Surgeon: Robert Bellow, MD;  Location: [facility] ORS;  Service: General;;    No family history on file.  Social History Social History  Substance Use Topics  . Smoking status: Never Smoker  . Smokeless tobacco: Never Used  . Alcohol use No    Allergies  Allergen Reactions  . Amoxicillin Rash    Has patient had a PCN reaction causing immediate rash,  facial/tongue/throat swelling, SOB or lightheadedness with hypotension:Yes Has patient had a PCN reaction causing severe rash involving mucus membranes or skin necrosis:unsure Has patient had a PCN reaction that required hospitalization:No Has patient had a PCN reaction occurring within the last 10 years:No If all of the above answers are "NO", then may proceed with Cephalosporin use.     Current Outpatient Prescriptions  Medication Sig Dispense Refill  . aspirin EC 81 MG tablet Take 81 mg by mouth daily.    . hydrochlorothiazide (HYDRODIURIL) 25 MG tablet Take 25 mg by mouth daily.    Marland Kitchen lovastatin (MEVACOR) 40 MG tablet Take 40 mg by mouth at bedtime.    . metoprolol tartrate (LOPRESSOR) 25 MG tablet Take 25 mg by mouth 2 (two) times daily.    . naproxen sodium (ANAPROX) 220 MG tablet Take 220 mg by mouth daily as needed (for pain).     Marland Kitchen omeprazole (PRILOSEC) 20 MG capsule Take 20 mg by mouth at bedtime.      No current facility-administered medications for this visit.     Review of Systems Review of Systems  Constitutional: Negative.   Respiratory: Negative.   Cardiovascular: Negative.     Blood pressure 132/74, pulse 68, resp. rate 14, height _0  (1.6 m), weight 163 lb (73.9 kg).  The patient's weight is 8 pounds below her preop value. 4 pounds  above her January 2018 value.  Physical Exam Physical Exam  Constitutional: She is oriented to person, place, and time. She appears well-developed and well-nourished.  Cardiovascular: Normal rate, regular rhythm and normal heart sounds.   Pulmonary/Chest: Effort normal and breath sounds normal.  Abdominal: Soft. Normal appearance and bowel sounds are normal. There is no tenderness.    Neurological: She is alert and oriented to person, place, and time.  Skin: Skin is warm and dry.    Data Reviewed 06/05/2016: Pathologic Stage Classification (pTNM, AJCC 8th Edition): pT3 pN1a  1/28 nodes positive.  CEA fell from 6.4 prior to  surgery to 4.8 ng/mL 3 weeks after surgery.  Assessment    Doing well status post right hemicolectomy for near obstructing carcinoma.    Plan    The patient is doing very well. She is ambulating quite readily with her walker. Family reports good appetite.  Weight is around baseline.  The patient has a follow-up appointment with Dr. Mike Gip next week to review pathology and to discuss whether adjuvant therapy would be appropriate.     Patient to return as needed.  HPI, Physical Exam, Assessment and Plan have been scribed under the direction and in the presence of Hervey Ard, MD.  Gaspar Cola, CMA  I have completed the exam and reviewed the above documentation for accuracy and completeness.  I agree with the above.  Haematologist has been used and any errors in dictation or transcription are unintentional.  Hervey Ard, M.D., F.A.C.S.   Robert Bellow 07/18/2016, 9:47 PM

## 2016-07-17 NOTE — Patient Instructions (Signed)
Return as needed

## 2016-07-18 ENCOUNTER — Encounter: Payer: Self-pay | Admitting: General Surgery

## 2016-07-23 ENCOUNTER — Inpatient Hospital Stay: Payer: PPO | Attending: Hematology and Oncology | Admitting: Hematology and Oncology

## 2016-07-23 ENCOUNTER — Inpatient Hospital Stay: Payer: PPO

## 2016-07-23 ENCOUNTER — Ambulatory Visit
Admission: RE | Admit: 2016-07-23 | Discharge: 2016-07-23 | Disposition: A | Discharge status: Home / Self Care | Payer: PPO | Source: Ambulatory Visit | Attending: Hematology and Oncology | Admitting: Hematology and Oncology

## 2016-07-23 VITALS — BP 152/61 | HR 50 | Temp 97.4°F | Resp 18 | Wt 169.3 lb

## 2016-07-23 DIAGNOSIS — I2510 Atherosclerotic heart disease of native coronary artery without angina pectoris: Secondary | ICD-10-CM | POA: Diagnosis not present

## 2016-07-23 DIAGNOSIS — D500 Iron deficiency anemia secondary to blood loss (chronic): Secondary | ICD-10-CM | POA: Insufficient documentation

## 2016-07-23 DIAGNOSIS — C182 Malignant neoplasm of ascending colon: Secondary | ICD-10-CM

## 2016-07-23 DIAGNOSIS — N182 Chronic kidney disease, stage 2 (mild): Secondary | ICD-10-CM | POA: Diagnosis not present

## 2016-07-23 DIAGNOSIS — R600 Localized edema: Secondary | ICD-10-CM | POA: Diagnosis not present

## 2016-07-23 DIAGNOSIS — G4700 Insomnia, unspecified: Secondary | ICD-10-CM | POA: Insufficient documentation

## 2016-07-23 DIAGNOSIS — M7122 Synovial cyst of popliteal space [Baker], left knee: Secondary | ICD-10-CM | POA: Insufficient documentation

## 2016-07-23 DIAGNOSIS — Z79899 Other long term (current) drug therapy: Secondary | ICD-10-CM | POA: Insufficient documentation

## 2016-07-23 DIAGNOSIS — Z7982 Long term (current) use of aspirin: Secondary | ICD-10-CM | POA: Insufficient documentation

## 2016-07-23 DIAGNOSIS — C184 Malignant neoplasm of transverse colon: Secondary | ICD-10-CM

## 2016-07-23 DIAGNOSIS — N184 Chronic kidney disease, stage 4 (severe): Secondary | ICD-10-CM

## 2016-07-23 DIAGNOSIS — K219 Gastro-esophageal reflux disease without esophagitis: Secondary | ICD-10-CM | POA: Diagnosis not present

## 2016-07-23 DIAGNOSIS — E7800 Pure hypercholesterolemia, unspecified: Secondary | ICD-10-CM | POA: Diagnosis not present

## 2016-07-23 DIAGNOSIS — M7989 Other specified soft tissue disorders: Secondary | ICD-10-CM | POA: Diagnosis not present

## 2016-07-23 DIAGNOSIS — K259 Gastric ulcer, unspecified as acute or chronic, without hemorrhage or perforation: Secondary | ICD-10-CM | POA: Diagnosis not present

## 2016-07-23 DIAGNOSIS — K449 Diaphragmatic hernia without obstruction or gangrene: Secondary | ICD-10-CM | POA: Diagnosis not present

## 2016-07-23 DIAGNOSIS — I129 Hypertensive chronic kidney disease with stage 1 through stage 4 chronic kidney disease, or unspecified chronic kidney disease: Secondary | ICD-10-CM

## 2016-07-23 DIAGNOSIS — K769 Liver disease, unspecified: Secondary | ICD-10-CM | POA: Diagnosis not present

## 2016-07-23 DIAGNOSIS — Z951 Presence of aortocoronary bypass graft: Secondary | ICD-10-CM | POA: Diagnosis not present

## 2016-07-23 LAB — BASIC METABOLIC PANEL
Anion gap: 8 (ref 5–15)
BUN: 25 mg/dL — ABNORMAL HIGH (ref 6–20)
CO2: 27 mmol/L (ref 22–32)
Calcium: 8.6 mg/dL — ABNORMAL LOW (ref 8.9–10.3)
Chloride: 102 mmol/L (ref 101–111)
Creatinine, Ser: 1.33 mg/dL — ABNORMAL HIGH (ref 0.44–1.00)
GFR calc Af Amer: 38 mL/min — ABNORMAL LOW (ref >60)
GFR calc non Af Amer: 33 mL/min — ABNORMAL LOW (ref >60)
Glucose, Bld: 102 mg/dL — ABNORMAL HIGH (ref 65–99)
Potassium: 4.1 mmol/L (ref 3.5–5.1)
Sodium: 137 mmol/L (ref 135–145)

## 2016-07-23 LAB — CBC WITH DIFFERENTIAL/PLATELET
Basophils Absolute: 0 10*3/uL (ref 0–0.1)
Basophils Relative: 1 %
Eosinophils Absolute: 0.2 10*3/uL (ref 0–0.7)
Eosinophils Relative: 4 %
HCT: 35.7 % (ref 35.0–47.0)
Hemoglobin: 11.7 g/dL — ABNORMAL LOW (ref 12.0–16.0)
Lymphocytes Relative: 22 %
Lymphs Abs: 1.2 10*3/uL (ref 1.0–3.6)
MCH: 27.2 pg (ref 26.0–34.0)
MCHC: 32.8 g/dL (ref 32.0–36.0)
MCV: 82.8 fL (ref 80.0–100.0)
Monocytes Absolute: 0.5 10*3/uL (ref 0.2–0.9)
Monocytes Relative: 10 %
Neutro Abs: 3.5 10*3/uL (ref 1.4–6.5)
Neutrophils Relative %: 63 %
Platelets: 220 10*3/uL (ref 150–440)
RBC: 4.31 MIL/uL (ref 3.80–5.20)
RDW: 16.2 % — ABNORMAL HIGH (ref 11.5–14.5)
WBC: 5.5 10*3/uL (ref 3.6–11.0)

## 2016-07-23 LAB — FERRITIN: Ferritin: 14 ng/mL (ref 11–307)

## 2016-07-23 NOTE — Progress Notes (Signed)
Patient offers no complaints today.  Patient did fall in her home this past week.

## 2016-07-23 NOTE — Progress Notes (Signed)
[facility] day:  07/23/2016  Chief Complaint: Alexis Haas is a 83 y.o. female with stage III colon cancer and iron deficiency anemia who is seen for 1 month assessment.  HPI:  The patient was last seen in the medical oncology clinic on 06/25/2016.  At that time, she was seen for initial assessment following laparoscopic assisted right hemicolectomy with ileotransverse colostomy.  One of 28 lymph nodes were positive.  Margins were negative.  Pathologic stage was T3N1a (stage III).   Labs at last visit, labs included a hematocrit 34.0, hemoglobin 11.4, and MCV 83.2. Ferritin was 21. Basic metabolic panel included a creatinine of 1.55 with an estimated creatinine clearance of  27 ml/min.  CEA was 4.8.  Preop creatinine was 1.19.  During the interm, she has felt "back to myself".  She is eating well.  She saw Dr Bary Castilla and will see Dr Clemmie Krill next week.  She states that she has finally retired.   Past Medical History:  Diagnosis Date  . Anemia   . Arthritis   . Cancer ([place]) 04/15/2016   T3, N1a adenocarcinoma of the hepatic flexure. No mismatch repair gene abnormality.   . Chronic kidney disease    LABS 3/18 COMPARED WITH 1 YEAR AGO  . Coronary artery disease   . Fall 2018  . GERD (gastroesophageal reflux disease)   . Heart disease   . Hypercholesteremia   . Hypertension   . Insomnia     Past Surgical History:  Procedure Laterality Date  . CARDIAC SURGERY  2005   bypass  . COLONOSCOPY WITH PROPOFOL N/A 04/15/2016   Procedure: COLONOSCOPY WITH PROPOFOL;  Surgeon: Lucilla Lame, MD;  Location: [facility] ENDOSCOPY;  Service: Endoscopy;  Laterality: N/A;  . CORONARY ARTERY BYPASS GRAFT  2005   x 2  . ESOPHAGOGASTRODUODENOSCOPY (EGD) WITH PROPOFOL N/A 04/15/2016   Procedure: ESOPHAGOGASTRODUODENOSCOPY (EGD) WITH PROPOFOL;  Surgeon: Lucilla Lame, MD;  Location: [facility] ENDOSCOPY;  Service: Endoscopy;  Laterality: N/A;  . LAPAROSCOPIC RIGHT COLECTOMY  Right 06/05/2016   Procedure: LAPAROSCOPIC RIGHT COLECTOMY;  Surgeon: Robert Bellow, MD;  Location: [facility] ORS;  Service: General;  Laterality: Right;  . toe removal    . TONSILLECTOMY     age 88  . UMBILICAL HERNIA REPAIR  06/05/2016   Procedure: HERNIA REPAIR UMBILICAL ADULT;  Surgeon: Robert Bellow, MD;  Location: [facility] ORS;  Service: General;;    No family history on file.  Social History:  reports that she has never smoked. She has never used smokeless tobacco. She reports that she does not drink alcohol or use drugs.  She states that she has worked her whole life (since the age of 76).  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  She lives in [place] at [place].  She lives alone.  Her husband died 66 years ago.  Her daughter is Theodora Blow (home: [phone]; work: [phone]).  The patient is accompanied by her daughter today.  Allergies:  Allergies  Allergen Reactions  . Amoxicillin Rash    Has patient had a PCN reaction causing immediate rash, facial/tongue/throat swelling, SOB or lightheadedness with hypotension:Yes Has patient had a PCN reaction causing severe rash involving mucus membranes or skin necrosis:unsure Has patient had a PCN reaction that required hospitalization:No Has patient had a PCN reaction occurring within the last 10 years:No If all of the above answers are "NO", then may  proceed with Cephalosporin use.     Current Medications: Current Outpatient Prescriptions  Medication Sig Dispense Refill  . aspirin EC 81 MG tablet Take 81 mg by mouth daily.    . hydrochlorothiazide (HYDRODIURIL) 25 MG tablet Take 25 mg by mouth daily.    Marland Kitchen lovastatin (MEVACOR) 40 MG tablet Take 40 mg by mouth at bedtime.    . metoprolol tartrate (LOPRESSOR) 25 MG tablet Take 25 mg by mouth 2 (two) times daily.    . naproxen sodium (ANAPROX) 220 MG tablet Take 220 mg by mouth daily as needed (for pain).     Marland Kitchen omeprazole  (PRILOSEC) 20 MG capsule Take 20 mg by mouth at bedtime.      No current facility-administered medications for this visit.     Review of Systems:  GENERAL:  Feels "back to self".  No fevers or sweats.  Weight up 4 pounds. PERFORMANCE STATUS (ECOG):  1 HEENT:  No visual changes, sore throat, mouth sores or tenderness. Lungs: No shortness of breath or cough.  No hemoptysis. Cardiac:  No chest pain, palpitations, orthopnea, or PND. GI:  Eating well.  No nausea, vomiting, diarrhea, constipation, melena or hematochezia. GU:  No urgency, frequency, dysuria, or hematuria. Musculoskeletal:  No back pain.  Arthritis in left knee (wears brace).  Can't lift right arm fully up secondary to arthritis.  No muscle tenderness. Extremities:  No pain or swelling. Skin:  No rashes or skin changes. Neuro:  Forgetful.  No headache, numbness or weakness, balance or coordination issues. Endocrine:  No diabetes, thyroid issues, hot flashes or night sweats. Psych:  No mood changes, depression or anxiety. Pain:  No focal pain. Review of systems:  All other systems reviewed and found to be negative.  Physical Exam: Blood pressure (!) 152/61, pulse (!) 50, temperature 97.4 F (36.3 C), temperature source Tympanic, resp. rate 18, weight 169 lb 5 oz (76.8 kg). GENERAL:  Elderly woman sitting comfortably in the exam room in no acute distress.  She has a rolling walker at her side. MENTAL STATUS:  Alert and oriented to person, place and time. HEAD:  Curly gray hair.  Resolved right forehead hematoma and ecchymosis.  Normocephalic, face symmetric, no Cushingoid features. EYES:  Glasses.  Blue eyes.  Pupils equal round and reactive to light and accomodation.  No conjunctivitis or scleral icterus. ENT:  Oropharynx clear without lesion.  Dentures.  Tongue normal. Mucous membranes moist.  RESPIRATORY:  Clear to auscultation without rales, wheezes or rhonchi. CARDIOVASCULAR:  Regular rate and rhythm without murmur, rub or  gallop.  No JVD. ABDOMEN:  Soft, non-tender, with active bowel sounds, and no hepatosplenomegaly.  No masses. SKIN:  No rashes, ulcers or lesions. EXTREMITIES: Arthritic changes in hands.  Left knee brace.  2+ pitting left lower extremity edema.  No skin discoloration or tenderness.  No palpable cords. LYMPH NODES: No palpable cervical, supraclavicular, axillary or inguinal adenopathy  NEUROLOGICAL: Unremarkable. PSYCH:  Appropriate.   Appointment on 07/23/2016  Component Date Value Ref Range Status  . Sodium 07/23/2016 137  135 - 145 mmol/L Final  . Potassium 07/23/2016 4.1  3.5 - 5.1 mmol/L Final  . Chloride 07/23/2016 102  101 - 111 mmol/L Final  . CO2 07/23/2016 27  22 - 32 mmol/L Final  . Glucose, Bld 07/23/2016 102* 65 - 99 mg/dL Final  . BUN 07/23/2016 25* 6 - 20 mg/dL Final  . Creatinine, Ser 07/23/2016 1.33* 0.44 - 1.00 mg/dL Final  . Calcium 07/23/2016 8.6* 8.9 -  10.3 mg/dL Final  . GFR calc non Af Amer 07/23/2016 33* >60 mL/min Final  . GFR calc Af Amer 07/23/2016 38* >60 mL/min Final   Comment: (NOTE) The eGFR has been calculated using the CKD EPI equation. This calculation has not been validated in all clinical situations. eGFR's persistently <60 mL/min signify possible Chronic Kidney Disease.   . Anion gap 07/23/2016 8  5 - 15 Final  . WBC 07/23/2016 5.5  3.6 - 11.0 K/uL Final  . RBC 07/23/2016 4.31  3.80 - 5.20 MIL/uL Final  . Hemoglobin 07/23/2016 11.7* 12.0 - 16.0 g/dL Final  . HCT 07/23/2016 35.7  35.0 - 47.0 % Final  . MCV 07/23/2016 82.8  80.0 - 100.0 fL Final  . MCH 07/23/2016 27.2  26.0 - 34.0 pg Final  . MCHC 07/23/2016 32.8  32.0 - 36.0 g/dL Final  . RDW 07/23/2016 16.2* 11.5 - 14.5 % Final  . Platelets 07/23/2016 220  150 - 440 K/uL Final  . Neutrophils Relative % 07/23/2016 63  % Final  . Neutro Abs 07/23/2016 3.5  1.4 - 6.5 K/uL Final  . Lymphocytes Relative 07/23/2016 22  % Final  . Lymphs Abs 07/23/2016 1.2  1.0 - 3.6 K/uL Final  . Monocytes  Relative 07/23/2016 10  % Final  . Monocytes Absolute 07/23/2016 0.5  0.2 - 0.9 K/uL Final  . Eosinophils Relative 07/23/2016 4  % Final  . Eosinophils Absolute 07/23/2016 0.2  0 - 0.7 K/uL Final  . Basophils Relative 07/23/2016 1  % Final  . Basophils Absolute 07/23/2016 0.0  0 - 0.1 K/uL Final    Assessment:  ANAYS DETORE is a 83 y.o. 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 Pathology revealed a 3.7 cm moderately differentiated adenocarcinoma. Tumor invaded the muscularis propria into the pericolorectal tissue. One of 28 lymph nodes were positive. Margins were negative. Pathologic stage was T3N1a (stage III). MSI/MMR was intact.  PET scan on 05/19/2016 revealed markedly hypermetabolic lesion in the distal ascending colon extending into the proximal hepatic flexure, consistent with the known primary malignancy.  There was a tiny focus of FDG uptake in the medial right liver, just minimally above the background expected mottled hepatic uptake. CT imaging suggests there may be a tiny 9 mm hypoattenuating focus at this location. While not definite, imaging features to raise concern for the possibility of metastatic disease in the liver.  CEA was 6.4 on 05/07/2016.  She presented with chronic iron deficiency anemia secondary to blood loss.  Invasive procedures (EGD or colonoscopy) were initially deferred secondary to her age.  Guaiac cards were negative (12/2014 and 10/2015) then positive (01/2016).  Diet is modest.  EGD on 04/15/2016 revealed a medium size hiatal hernia, nonbleeding gastric ulcer with no stigmata of bleeding.  Oral iron is ineffective.  She received 2 units PRBCs on 10/11/2015 for a hematocrit of 21.1 and hemoglobin 6.4.  She has received Feraheme on several occasions:  01/11/2015, 02/01/2015, 03/15/2015, 03/22/2015, 10/16/2015, 01/09/2016, 03/05/2016, and on  05/09/2016.    Ferritin has been monitored: 5 on 01/02/2015, 36 on 01/31/2105, 26 on 03/08/2015, 184 on 05/02/2015, 7 on 10/10/2015, 91 on 11/07/2015, 7 on 01/02/2016, 14 on 03/03/2016, 33 on 04/07/2016, and 12 on 05/07/2016.  She has chronic mild renal insufficiency.  Creatinine is 1.55 (CrCl 24 ml/min) today.  Symptomatically, she is back to normal.  She denies any melena or hematochezia.  She is eating well  and gaining weight.  Exam reveals pitting edema in the left lower extremity.  Plan: 1.  Labs today:  CBC with diff, ferritin. 2.  Review diagnosis of stage III colon cancer.  Review unclear significance of tiny 9 mm lesion in medial right liver.  This may reflect metastatic disease.  Discuss plan for follow-up imaging.  Discuss patient's thoughts about treatment.  Decision made for observation based on patient's age and quality of life issues.  3.  LLE duplex today:  r/o DVT. 4.  RTC on 09/10/2016 for MD assess and labs (CBC with diff, CMP, CEA, and ferritin).  Addendum:  Left lower extremity duplex revealed no evidence of DVT.   Lequita Asal, MD  07/23/2016, 1:53 PM

## 2016-07-24 ENCOUNTER — Telehealth: Payer: Self-pay | Admitting: *Deleted

## 2016-07-24 NOTE — Telephone Encounter (Signed)
-----   Message from Lequita Asal, MD sent at 07/23/2016  5:05 PM EDT ----- Regarding: Please call patient  Iron stores a little lower.  Eat iron rich foods.  Consider oral iron with OJ or vitamin C.  M  ----- Message ----- From: Interface, Lab In [place] Sent: 07/23/2016   1:28 PM To: Lequita Asal, MD

## 2016-07-24 NOTE — Telephone Encounter (Signed)
Called patient to inform her that iron stores are a little low.  She should  Include iron rich foods in her diet.  Also recommended oral iron but instructed her take it with OJ or Vitamin C.  Patient verbalized understanding.

## 2016-07-27 ENCOUNTER — Encounter: Payer: Self-pay | Admitting: Hematology and Oncology

## 2016-07-28 DIAGNOSIS — H6123 Impacted cerumen, bilateral: Secondary | ICD-10-CM | POA: Diagnosis not present

## 2016-07-28 DIAGNOSIS — R5383 Other fatigue: Secondary | ICD-10-CM | POA: Diagnosis not present

## 2016-07-28 DIAGNOSIS — H9193 Unspecified hearing loss, bilateral: Secondary | ICD-10-CM | POA: Diagnosis not present

## 2016-07-28 DIAGNOSIS — H539 Unspecified visual disturbance: Secondary | ICD-10-CM | POA: Diagnosis not present

## 2016-07-28 DIAGNOSIS — M170 Bilateral primary osteoarthritis of knee: Secondary | ICD-10-CM | POA: Diagnosis not present

## 2016-07-28 DIAGNOSIS — Z6830 Body mass index (BMI) 30.0-30.9, adult: Secondary | ICD-10-CM | POA: Diagnosis not present

## 2016-07-28 DIAGNOSIS — R600 Localized edema: Secondary | ICD-10-CM | POA: Diagnosis not present

## 2016-08-11 DIAGNOSIS — Z4689 Encounter for fitting and adjustment of other specified devices: Secondary | ICD-10-CM | POA: Diagnosis not present

## 2016-08-11 DIAGNOSIS — Z6829 Body mass index (BMI) 29.0-29.9, adult: Secondary | ICD-10-CM | POA: Diagnosis not present

## 2016-08-11 DIAGNOSIS — M1711 Unilateral primary osteoarthritis, right knee: Secondary | ICD-10-CM | POA: Diagnosis not present

## 2016-08-11 DIAGNOSIS — H9193 Unspecified hearing loss, bilateral: Secondary | ICD-10-CM | POA: Diagnosis not present

## 2016-08-11 DIAGNOSIS — N39498 Other specified urinary incontinence: Secondary | ICD-10-CM | POA: Diagnosis not present

## 2016-09-10 ENCOUNTER — Inpatient Hospital Stay: Payer: PPO

## 2016-09-10 ENCOUNTER — Other Ambulatory Visit: Payer: PPO

## 2016-09-10 ENCOUNTER — Encounter: Payer: Self-pay | Admitting: Hematology and Oncology

## 2016-09-10 ENCOUNTER — Inpatient Hospital Stay: Payer: PPO | Attending: Hematology and Oncology | Admitting: Hematology and Oncology

## 2016-09-10 ENCOUNTER — Ambulatory Visit: Payer: PPO | Admitting: Hematology and Oncology

## 2016-09-10 VITALS — BP 163/65 | HR 43 | Temp 97.7°F | Resp 18 | Wt 173.1 lb

## 2016-09-10 DIAGNOSIS — K219 Gastro-esophageal reflux disease without esophagitis: Secondary | ICD-10-CM | POA: Insufficient documentation

## 2016-09-10 DIAGNOSIS — R600 Localized edema: Secondary | ICD-10-CM | POA: Diagnosis not present

## 2016-09-10 DIAGNOSIS — E7800 Pure hypercholesterolemia, unspecified: Secondary | ICD-10-CM | POA: Insufficient documentation

## 2016-09-10 DIAGNOSIS — Z79899 Other long term (current) drug therapy: Secondary | ICD-10-CM | POA: Insufficient documentation

## 2016-09-10 DIAGNOSIS — I129 Hypertensive chronic kidney disease with stage 1 through stage 4 chronic kidney disease, or unspecified chronic kidney disease: Secondary | ICD-10-CM | POA: Diagnosis not present

## 2016-09-10 DIAGNOSIS — K449 Diaphragmatic hernia without obstruction or gangrene: Secondary | ICD-10-CM | POA: Diagnosis not present

## 2016-09-10 DIAGNOSIS — C182 Malignant neoplasm of ascending colon: Secondary | ICD-10-CM

## 2016-09-10 DIAGNOSIS — G4700 Insomnia, unspecified: Secondary | ICD-10-CM | POA: Diagnosis not present

## 2016-09-10 DIAGNOSIS — N182 Chronic kidney disease, stage 2 (mild): Secondary | ICD-10-CM | POA: Diagnosis not present

## 2016-09-10 DIAGNOSIS — Z951 Presence of aortocoronary bypass graft: Secondary | ICD-10-CM | POA: Insufficient documentation

## 2016-09-10 DIAGNOSIS — D500 Iron deficiency anemia secondary to blood loss (chronic): Secondary | ICD-10-CM | POA: Insufficient documentation

## 2016-09-10 DIAGNOSIS — K259 Gastric ulcer, unspecified as acute or chronic, without hemorrhage or perforation: Secondary | ICD-10-CM | POA: Diagnosis not present

## 2016-09-10 DIAGNOSIS — C184 Malignant neoplasm of transverse colon: Secondary | ICD-10-CM

## 2016-09-10 DIAGNOSIS — Z7982 Long term (current) use of aspirin: Secondary | ICD-10-CM | POA: Insufficient documentation

## 2016-09-10 DIAGNOSIS — Z933 Colostomy status: Secondary | ICD-10-CM | POA: Insufficient documentation

## 2016-09-10 DIAGNOSIS — I2510 Atherosclerotic heart disease of native coronary artery without angina pectoris: Secondary | ICD-10-CM | POA: Diagnosis not present

## 2016-09-10 LAB — COMPREHENSIVE METABOLIC PANEL
ALT: 13 U/L — ABNORMAL LOW (ref 14–54)
AST: 18 U/L (ref 15–41)
Albumin: 4.2 g/dL (ref 3.5–5.0)
Alkaline Phosphatase: 59 U/L (ref 38–126)
Anion gap: 8 (ref 5–15)
BUN: 31 mg/dL — ABNORMAL HIGH (ref 6–20)
CO2: 26 mmol/L (ref 22–32)
Calcium: 8.8 mg/dL — ABNORMAL LOW (ref 8.9–10.3)
Chloride: 101 mmol/L (ref 101–111)
Creatinine, Ser: 1.25 mg/dL — ABNORMAL HIGH (ref 0.44–1.00)
GFR calc Af Amer: 40 mL/min — ABNORMAL LOW (ref >60)
GFR calc non Af Amer: 35 mL/min — ABNORMAL LOW (ref >60)
Glucose, Bld: 99 mg/dL (ref 65–99)
Potassium: 3.6 mmol/L (ref 3.5–5.1)
Sodium: 135 mmol/L (ref 135–145)
Total Bilirubin: 0.6 mg/dL (ref 0.3–1.2)
Total Protein: 7.1 g/dL (ref 6.5–8.1)

## 2016-09-10 LAB — CBC WITH DIFFERENTIAL/PLATELET
Basophils Absolute: 0 10*3/uL (ref 0–0.1)
Basophils Relative: 1 %
Eosinophils Absolute: 0.3 10*3/uL (ref 0–0.7)
Eosinophils Relative: 6 %
HCT: 37.5 % (ref 35.0–47.0)
Hemoglobin: 12.5 g/dL (ref 12.0–16.0)
Lymphocytes Relative: 21 %
Lymphs Abs: 1.1 10*3/uL (ref 1.0–3.6)
MCH: 28.2 pg (ref 26.0–34.0)
MCHC: 33.3 g/dL (ref 32.0–36.0)
MCV: 84.6 fL (ref 80.0–100.0)
Monocytes Absolute: 0.6 10*3/uL (ref 0.2–0.9)
Monocytes Relative: 12 %
Neutro Abs: 3.2 10*3/uL (ref 1.4–6.5)
Neutrophils Relative %: 60 %
Platelets: 227 10*3/uL (ref 150–440)
RBC: 4.43 MIL/uL (ref 3.80–5.20)
RDW: 16.9 % — ABNORMAL HIGH (ref 11.5–14.5)
WBC: 5.3 10*3/uL (ref 3.6–11.0)

## 2016-09-10 LAB — FERRITIN: Ferritin: 14 ng/mL (ref 11–307)

## 2016-09-10 NOTE — Progress Notes (Signed)
Patient offers no complaints today. 

## 2016-09-10 NOTE — Progress Notes (Signed)
[facility] day:  09/10/2016  Chief Complaint: Alexis Haas is a 83 y.o. female with stage III colon cancer and iron deficiency anemia who is seen for 6 week assessment.  HPI:  The patient was last seen in the medical oncology clinic on 07/23/2016.  At that time, she was doing well.  She denied any complaint.  Hematocrit was 35.7, hemoglobin 11.7, and MCV 82.8.  Ferritin was 14.  Creatinine had improved to 1.33.  Exam at last visit and revealed a swollen left lower extremity. Duplex study was negative.  During the interm, she has felt "alright".  She states that she would feel better if she could walk better.  She is getting around in her walker.  She denies any abdominal complaints.   Past Medical History:  Diagnosis Date  . Anemia   . Arthritis   . Cancer ([place]) 04/15/2016   T3, N1a adenocarcinoma of the hepatic flexure. No mismatch repair gene abnormality.   . Chronic kidney disease    LABS 3/18 COMPARED WITH 1 YEAR AGO  . Coronary artery disease   . Fall 2018  . GERD (gastroesophageal reflux disease)   . Heart disease   . Hypercholesteremia   . Hypertension   . Insomnia     Past Surgical History:  Procedure Laterality Date  . CARDIAC SURGERY  2005   bypass  . COLONOSCOPY WITH PROPOFOL N/A 04/15/2016   Procedure: COLONOSCOPY WITH PROPOFOL;  Surgeon: Lucilla Lame, MD;  Location: [facility] ENDOSCOPY;  Service: Endoscopy;  Laterality: N/A;  . CORONARY ARTERY BYPASS GRAFT  2005   x 2  . ESOPHAGOGASTRODUODENOSCOPY (EGD) WITH PROPOFOL N/A 04/15/2016   Procedure: ESOPHAGOGASTRODUODENOSCOPY (EGD) WITH PROPOFOL;  Surgeon: Lucilla Lame, MD;  Location: [facility] END[facility]COPY;  Service: Endoscopy;  Laterality: N/A;  . LAPAROSCOPIC RIGHT COLECTOMY Right 06/05/2016   Procedure: LAPAROSCOPIC RIGHT COLECTOMY;  Surgeon: Robert Bellow, MD;  Location: [facility] ORS;  Service: General;  Laterality: Right;  . toe removal    . TONSILLECTOMY     age 70  . UMBILICAL  HERNIA REPAIR  06/05/2016   Procedure: HERNIA REPAIR UMBILICAL ADULT;  Surgeon: Robert Bellow, MD;  Location: [facility] ORS;  Service: General;;    History reviewed. No pertinent family history.  Social History:  reports that she has never smoked. She has never used smokeless tobacco. She reports that she does not drink alcohol or use drugs.  She states that she has worked her whole life (since the age of 60).  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  She lives in [place] at [place].  She lives alone.  Her husband died 43 years ago.  Her daughter is Theodora Blow (home: [phone]; work: [phone]).  The patient is alone today.  Allergies:  Allergies  Allergen Reactions  . Amoxicillin Rash    Has patient had a PCN reaction causing immediate rash, facial/tongue/throat swelling, SOB or lightheadedness with hypotension:Yes Has patient had a PCN reaction causing severe rash involving mucus membranes or skin necrosis:unsure Has patient had a PCN reaction that required hospitalization:No Has patient had a PCN reaction occurring within the last 10 years:No If all of the above answers are "NO", then may proceed with Cephalosporin use.     Current Medications: Current Outpatient Prescriptions  Medication Sig Dispense Refill  . aspirin EC 81 MG tablet Take 81 mg by mouth daily.    . hydrochlorothiazide (  HYDRODIURIL) 25 MG tablet Take 25 mg by mouth daily.    Marland Kitchen lovastatin (MEVACOR) 40 MG tablet Take 40 mg by mouth at bedtime.    . metoprolol tartrate (LOPRESSOR) 25 MG tablet Take 25 mg by mouth 2 (two) times daily.    . naproxen sodium (ANAPROX) 220 MG tablet Take 220 mg by mouth daily as needed (for pain).     Marland Kitchen omeprazole (PRILOSEC) 20 MG capsule Take 20 mg by mouth at bedtime.      No current facility-administered medications for this visit.     Review of Systems:  GENERAL:  Feels "alright".  No fevers or sweats.  Weight up 4  pounds. PERFORMANCE STATUS (ECOG):  1 HEENT:  No visual changes, sore throat, mouth sores or tenderness. Lungs: No shortness of breath or cough.  No hemoptysis. Cardiac:  No chest pain, palpitations, orthopnea, or PND. GI:  Eating well.  No nausea, vomiting, diarrhea, constipation, melena or hematochezia. GU:  No urgency, frequency, dysuria, or hematuria. Musculoskeletal:  No back pain.  Arthritis in left knee (wears brace).  Can't lift right arm fully up secondary to arthritis.  No muscle tenderness. Extremities:  No pain or swelling. Skin:  No rashes or skin changes. Neuro:  Forgetful.  No headache, numbness or weakness, balance or coordination issues. Endocrine:  No diabetes, thyroid issues, hot flashes or night sweats. Psych:  No mood changes, depression or anxiety. Pain:  No focal pain. Review of systems:  All other systems reviewed and found to be negative.  Physical Exam: Blood pressure (!) 163/65, pulse (!) 43, temperature 97.7 F (36.5 C), temperature source Tympanic, resp. rate 18, weight 173 lb 1 oz (78.5 kg). GENERAL:  Elderly woman sitting comfortably in the exam room in no acute distress.  She has a rolling walker at her side. MENTAL STATUS:  Alert and oriented to person, place and time. HEAD:  Curly gray hair.  Normocephalic, face symmetric, no Cushingoid features. EYES:  Glasses.  Blue eyes.  Pupils equal round and reactive to light and accomodation.  No conjunctivitis or scleral icterus. ENT:  Oropharynx clear without lesion.  Dentures.  Tongue normal. Mucous membranes moist.  RESPIRATORY:  Clear to auscultation without rales, wheezes or rhonchi. CARDIOVASCULAR:  Regular rate and rhythm without murmur, rub or gallop.  No JVD. ABDOMEN:  Soft, non-tender, with active bowel sounds, and no hepatosplenomegaly.  No masses. SKIN:  No rashes, ulcers or lesions. EXTREMITIES: Arthritic changes in hands.  Left knee brace.  2+ left and 1+ right lower extremity edema.  No skin  discoloration or tenderness.  No palpable cords. LYMPH NODES: No palpable cervical, supraclavicular, axillary or inguinal adenopathy  NEUROLOGICAL: Unremarkable. PSYCH:  Appropriate.   Appointment on 09/10/2016  Component Date Value Ref Range Status  . WBC 09/10/2016 5.3  3.6 - 11.0 K/uL Final  . RBC 09/10/2016 4.43  3.80 - 5.20 MIL/uL Final  . Hemoglobin 09/10/2016 12.5  12.0 - 16.0 g/dL Final  . HCT 09/10/2016 37.5  35.0 - 47.0 % Final  . MCV 09/10/2016 84.6  80.0 - 100.0 fL Final  . MCH 09/10/2016 28.2  26.0 - 34.0 pg Final  . MCHC 09/10/2016 33.3  32.0 - 36.0 g/dL Final  . RDW 09/10/2016 16.9* 11.5 - 14.5 % Final  . Platelets 09/10/2016 227  150 - 440 K/uL Final  . Neutrophils Relative % 09/10/2016 60  % Final  . Neutro Abs 09/10/2016 3.2  1.4 - 6.5 K/uL Final  . Lymphocytes Relative 09/10/2016  21  % Final  . Lymphs Abs 09/10/2016 1.1  1.0 - 3.6 K/uL Final  . Monocytes Relative 09/10/2016 12  % Final  . Monocytes Absolute 09/10/2016 0.6  0.2 - 0.9 K/uL Final  . Eosinophils Relative 09/10/2016 6  % Final  . Eosinophils Absolute 09/10/2016 0.3  0 - 0.7 K/uL Final  . Basophils Relative 09/10/2016 1  % Final  . Basophils Absolute 09/10/2016 0.0  0 - 0.1 K/uL Final  . Sodium 09/10/2016 135  135 - 145 mmol/L Final  . Potassium 09/10/2016 3.6  3.5 - 5.1 mmol/L Final  . Chloride 09/10/2016 101  101 - 111 mmol/L Final  . CO2 09/10/2016 26  22 - 32 mmol/L Final  . Glucose, Bld 09/10/2016 99  65 - 99 mg/dL Final  . BUN 09/10/2016 31* 6 - 20 mg/dL Final  . Creatinine, Ser 09/10/2016 1.25* 0.44 - 1.00 mg/dL Final  . Calcium 09/10/2016 8.8* 8.9 - 10.3 mg/dL Final  . Total Protein 09/10/2016 7.1  6.5 - 8.1 g/dL Final  . Albumin 09/10/2016 4.2  3.5 - 5.0 g/dL Final  . AST 09/10/2016 18  15 - 41 U/L Final  . ALT 09/10/2016 13* 14 - 54 U/L Final  . Alkaline Phosphatase 09/10/2016 59  38 - 126 U/L Final  . Total Bilirubin 09/10/2016 0.6  0.3 - 1.2 mg/dL Final  . GFR calc non Af Amer  09/10/2016 35* >60 mL/min Final  . GFR calc Af Amer 09/10/2016 40* >60 mL/min Final   Comment: (NOTE) The eGFR has been calculated using the CKD EPI equation. This calculation has not been validated in all clinical situations. eGFR's persistently <60 mL/min signify possible Chronic Kidney Disease.   . Anion gap 09/10/2016 8  5 - 15 Final    Assessment:  JATAYA WANN is a 83 y.o. 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 Pathology revealed a 3.7 cm moderately differentiated adenocarcinoma. Tumor invaded the muscularis propria into the pericolorectal tissue. One of 28 lymph nodes were positive. Margins were negative. Pathologic stage was T3N1a (stage III). MSI/MMR was intact.  PET scan on 05/19/2016 revealed markedly hypermetabolic lesion in the distal ascending colon extending into the proximal hepatic flexure, consistent with the known primary malignancy.  There was a tiny focus of FDG uptake in the medial right liver, just minimally above the background expected mottled hepatic uptake. CT imaging suggests there may be a tiny 9 mm hypoattenuating focus at this location. While not definite, imaging features to raise concern for the possibility of metastatic disease in the liver.    CEA has been followed:  6.4 on 05/07/2016, 4.8 on 06/25/2016, and 5.4 on 09/10/2016.  She presented with chronic iron deficiency anemia secondary to blood loss.  Invasive procedures (EGD or colonoscopy) were initially deferred secondary to her age.  Guaiac cards were negative (12/2014 and 10/2015) then positive (01/2016).  Diet is modest.  EGD on 04/15/2016 revealed a medium size hiatal hernia, nonbleeding gastric ulcer with no stigmata of bleeding.  Oral iron is ineffective.  She received 2 units PRBCs on 10/11/2015 for a hematocrit of 21.1 and hemoglobin 6.4.  She has received Feraheme on several  occasions:  01/11/2015, 02/01/2015, 03/15/2015, 03/22/2015, 10/16/2015, 01/09/2016, 03/05/2016, and on 05/09/2016.    Ferritin has been monitored: 5 on 01/02/2015, 36 on 01/31/2105, 26 on 03/08/2015, 184 on 05/02/2015, 7 on 10/10/2015, 91 on 11/07/2015, 7 on 01/02/2016, 14 on 03/03/2016, 33  on 04/07/2016,12 on 05/07/2016, 21 on 06/25/2016, 14 on 07/23/2016, and 14 on 09/10/2016.  She has chronic mild renal insufficiency.  Creatinine is 1.25 (CrCl 35 ml/min) today.  She has chronic lower extremity edema (left > right).  Left lower extremity duplex on 07/23/2016 revealed no evidence of DVT.  Symptomatically, she feels "alright".  She is eating well.  She denies any bowel symptoms.  Exam is stable.  Hematocrit is 37.5.  Plan: 1.  Labs today:  CBC with diff, CMP, CEA, ferritin. 2.  Discuss patient's thoughts about follow-up imaging re:9 mm liver medial right liver lesion.  Patient uncertain.  Discuss avoidance of IV contrast secondary to renal function.  Patient considering follow-up PET scan if symptoms or significant increase in CEA. 3.  RN to call patient with lab results. 4.  RTC in 3 months for MD assessment and labs (CBC with diff, CMP, CEA, ferritin).   Lequita Asal, MD  09/10/2016, 11:19 AM

## 2016-09-11 LAB — CEA: CEA: 5.4 ng/mL — ABNORMAL HIGH (ref 0.0–4.7)

## 2016-12-08 ENCOUNTER — Other Ambulatory Visit: Payer: Self-pay | Admitting: *Deleted

## 2016-12-16 NOTE — Progress Notes (Signed)
[facility] day:  12/17/2016  Chief Complaint: Alexis Haas is a 83 y.o. female with stage III colon cancer and iron deficiency anemia who is seen for a 3 month assessment.  HPI:  The patient was last seen in the medical oncology clinic on 09/10/2016.  At that time, she was doing "alright".  Patient stated that she would feel better if she could walk better.  Patient required the use of a walker for ambulation.  CBC was unremarkable.  CMP revealed improving renal function; BUN 31 with a creatinine of 1.25.  CEA was elevated at 5.4.  Ferritin continued to be low at 14.  We discussed the significance of the elevated CEA given her history of stage III colon cancer.  We discussed reimaging to follow-up on the 9 mm medial right liver lesion.  Patient was uncertain at that time.  She was considering a follow-up PET scan if symptoms or significant increase in CEA.  In the interim, she notes no new concerns.  She notes fluid on her left knee.  She denies any melena, hematochezia or hematuria.   Past Medical History:  Diagnosis Date  . Anemia   . Arthritis   . Cancer ([place]) 04/15/2016   T3, N1a adenocarcinoma of the hepatic flexure. No mismatch repair gene abnormality.   . Chronic kidney disease    LABS 3/18 COMPARED WITH 1 YEAR AGO  . Coronary artery disease   . Fall 2018  . GERD (gastroesophageal reflux disease)   . Heart disease   . Hypercholesteremia   . Hypertension   . Insomnia     Past Surgical History:  Procedure Laterality Date  . CARDIAC SURGERY  2005   bypass  . CORONARY ARTERY BYPASS GRAFT  2005   x 2  . toe removal    . TONSILLECTOMY     age 9    No family history on file.  Social History:  reports that  has never smoked. she has never used smokeless tobacco. She reports that she does not drink alcohol or use drugs.  She states that she has worked her whole life (since the age of 14).  She worked at [employer] part time.  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  She lives in [place] at [place].  She lives alone.  Her husband died 14 years ago.  Her daughter is Alexis Haas (home: [phone]; work: [phone]).  The patient is alone today.  Allergies:  Allergies  Allergen Reactions  . Amoxicillin Rash    Has patient had a PCN reaction causing immediate rash, facial/tongue/throat swelling, SOB or lightheadedness with hypotension:Yes Has patient had a PCN reaction causing severe rash involving mucus membranes or skin necrosis:unsure Has patient had a PCN reaction that required hospitalization:No Has patient had a PCN reaction occurring within the last 10 years:No If all of the above answers are "NO", then may proceed with Cephalosporin use.     Current Medications: Current Outpatient Medications  Medication Sig Dispense Refill  . aspirin EC 81 MG tablet Take 81 mg by mouth daily.    . hydrochlorothiazide (HYDRODIURIL) 25 MG tablet Take 25 mg by mouth daily.    Marland Kitchen lovastatin (MEVACOR) 40 MG tablet Take 40 mg by mouth at bedtime.    . metoprolol tartrate (LOPRESSOR) 25 MG tablet Take 25 mg by mouth 2 (two) times daily.    . naproxen sodium (ANAPROX) 220 MG tablet Take 220  mg by mouth daily as needed (for pain).     Marland Kitchen omeprazole (PRILOSEC) 20 MG capsule Take 20 mg by mouth at bedtime.      No current facility-administered medications for this visit.     Review of Systems:  GENERAL:  Feels "alright".  No fevers or sweats.  Weight down 2 pounds. PERFORMANCE STATUS (ECOG):  1 HEENT:  No visual changes, sore throat, mouth sores or tenderness. Lungs: No shortness of breath or cough.  No hemoptysis. Cardiac:  No chest pain, palpitations, orthopnea, or PND. GI:  Eating well.  No nausea, vomiting, diarrhea, constipation, melena or hematochezia. GU:  No urgency, frequency, dysuria, or hematuria. Musculoskeletal:  No back pain.  Arthritis in left knee (wears brace).  Can't lift right arm fully up  secondary to arthritis (no change).  No muscle tenderness. Extremities:  No pain or swelling. Skin:  No rashes or skin changes. Neuro:  Forgetful.  No headache, numbness or weakness, balance or coordination issues. Endocrine:  No diabetes, thyroid issues, hot flashes or night sweats. Psych:  No mood changes, depression or anxiety. Pain:  No focal pain. Review of systems:  All other systems reviewed and found to be negative.  Physical Exam: Blood pressure (!) 179/75, pulse (!) 59, temperature (!) 97 F (36.1 C), temperature source Tympanic, resp. rate 18, weight 171 lb 4.8 oz (77.7 kg). GENERAL:  Elderly woman sitting comfortably in the exam room in no acute distress.  She has a rolling walker at her side. MENTAL STATUS:  Alert and oriented to person, place and time. HEAD:  Curly gray hair.  Normocephalic, face symmetric, no Cushingoid features. EYES:  Glasses.  Blue eyes.  Pupils equal round and reactive to light and accomodation.  No conjunctivitis or scleral icterus. ENT:  Oropharynx clear without lesion.  Dentures.  Tongue normal. Mucous membranes moist.  RESPIRATORY:  Clear to auscultation without rales, wheezes or rhonchi. CARDIOVASCULAR:  Regular rate and rhythm without murmur, rub or gallop.  No JVD. ABDOMEN:  Soft, non-tender, with active bowel sounds, and no hepatosplenomegaly.  No masses. SKIN:  No rashes, ulcers or lesions. EXTREMITIES: Arthritic changes in hands.  Left knee brace.  2+ left and 1+ right lower extremity edema.  No skin discoloration or tenderness.  No palpable cords. LYMPH NODES: No palpable cervical, supraclavicular, axillary or inguinal adenopathy  NEUROLOGICAL: Unremarkable. PSYCH:  Appropriate.   Appointment on 12/17/2016  Component Date Value Ref Range Status  . Ferritin 12/17/2016 24  11 - 307 ng/mL Final  . CEA 12/17/2016 5.6* 0.0 - 4.7 ng/mL Final   Comment: (NOTE)       Roche ECLIA methodology       Nonsmokers  <3.9                                      Smokers     <5.6 Performed At: [facility] [place], [place] [id number] Rush Farmer MD NF:[id number]   . Sodium 12/17/2016 137  135 - 145 mmol/L Final  . Potassium 12/17/2016 3.9  3.5 - 5.1 mmol/L Final  . Chloride 12/17/2016 103  101 - 111 mmol/L Final  . CO2 12/17/2016 23  22 - 32 mmol/L Final  . Glucose, Bld 12/17/2016 124* 65 - 99 mg/dL Final  . BUN 12/17/2016 40* 6 - 20 mg/dL Final  . Creatinine, Ser 12/17/2016 1.44* 0.44 - 1.00 mg/dL Final  .  Calcium 12/17/2016 8.9  8.9 - 10.3 mg/dL Final  . Total Protein 12/17/2016 7.8  6.5 - 8.1 g/dL Final  . Albumin 12/17/2016 4.3  3.5 - 5.0 g/dL Final  . AST 12/17/2016 21  15 - 41 U/L Final  . ALT 12/17/2016 14  14 - 54 U/L Final  . Alkaline Phosphatase 12/17/2016 57  38 - 126 U/L Final  . Total Bilirubin 12/17/2016 0.5  0.3 - 1.2 mg/dL Final  . GFR calc non Af Amer 12/17/2016 29* >60 mL/min Final  . GFR calc Af Amer 12/17/2016 34* >60 mL/min Final   Comment: (NOTE) The eGFR has been calculated using the CKD EPI equation. This calculation has not been validated in all clinical situations. eGFR's persistently <60 mL/min signify possible Chronic Kidney Disease.   . Anion gap 12/17/2016 11  5 - 15 Final  . WBC 12/17/2016 5.9  3.6 - 11.0 K/uL Final  . RBC 12/17/2016 4.48  3.80 - 5.20 MIL/uL Final  . Hemoglobin 12/17/2016 13.0  12.0 - 16.0 g/dL Final  . HCT 12/17/2016 39.2  35.0 - 47.0 % Final  . MCV 12/17/2016 87.4  80.0 - 100.0 fL Final  . MCH 12/17/2016 29.0  26.0 - 34.0 pg Final  . MCHC 12/17/2016 33.2  32.0 - 36.0 g/dL Final  . RDW 12/17/2016 15.5* 11.5 - 14.5 % Final  . Platelets 12/17/2016 214  150 - 440 K/uL Final  . Neutrophils Relative % 12/17/2016 68  % Final  . Neutro Abs 12/17/2016 4.0  1.4 - 6.5 K/uL Final  . Lymphocytes Relative 12/17/2016 19  % Final  . Lymphs Abs 12/17/2016 1.1  1.0 - 3.6 K/uL Final  . Monocytes Relative 12/17/2016 8  % Final  . Monocytes Absolute 12/17/2016 0.5  0.2 - 0.9  K/uL Final  . Eosinophils Relative 12/17/2016 4  % Final  . Eosinophils Absolute 12/17/2016 0.3  0 - 0.7 K/uL Final  . Basophils Relative 12/17/2016 1  % Final  . Basophils Absolute 12/17/2016 0.0  0 - 0.1 K/uL Final    Assessment:  Alexis Haas is a 83 y.o. 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 Pathology revealed a 3.7 cm moderately differentiated adenocarcinoma. Tumor invaded the muscularis propria into the pericolorectal tissue. One of 28 lymph nodes were positive. Margins were negative. Pathologic stage was T3N1a (stage III). MSI/MMR was intact.  PET scan on 05/19/2016 revealed markedly hypermetabolic lesion in the distal ascending colon extending into the proximal hepatic flexure, consistent with the known primary malignancy.  There was a tiny focus of FDG uptake in the medial right liver, just minimally above the background expected mottled hepatic uptake. CT imaging suggests there may be a tiny 9 mm hypoattenuating focus at this location. While not definite, imaging features to raise concern for the possibility of metastatic disease in the liver.    CEA has been followed:  6.4 on 05/07/2016, 4.8 on 06/25/2016, 5.4 on 09/10/2016, and 5.6 on 12/17/2016.  She presented with chronic iron deficiency anemia secondary to blood loss.  Invasive procedures (EGD or colonoscopy) were initially deferred secondary to her age.  Guaiac cards were negative (12/2014 and 10/2015) then positive (01/2016).  Diet is modest.  EGD on 04/15/2016 revealed a medium size hiatal hernia, nonbleeding gastric ulcer with no stigmata of bleeding.  Oral iron is ineffective.  She received 2 units PRBCs on 10/11/2015 for a hematocrit of 21.1 and hemoglobin 6.4.  She has received Feraheme on several occasions:  01/11/2015, 02/01/2015, 03/15/2015, 03/22/2015, 10/16/2015, 01/09/2016, 03/05/2016, and on  05/09/2016.    Ferritin has been monitored: 5 on 01/02/2015, 36 on 01/31/2105, 26 on 03/08/2015, 184 on 05/02/2015, 7 on 10/10/2015, 91 on 11/07/2015, 7 on 01/02/2016, 14 on 03/03/2016, 33 on 04/07/2016,12 on 05/07/2016, 21 on 06/25/2016, 14 on 07/23/2016, 14 on 09/10/2016, and 24 on 12/17/2016.  She has chronic mild renal insufficiency.  Creatinine is 1.44 (CrCl 29 ml/min) today.  She has chronic lower extremity edema (left > right).  Left lower extremity duplex on 07/23/2016 revealed no evidence of DVT.  Symptomatically, she feels "alright".  She denies any bowel symptoms.  Exam is stable.  Hematocrit is 39.2.  Plan: 1.  Labs today:  CBC with diff, CMP, CEA, ferritin. 2.  Discuss patient's thoughts about follow-up imaging.  Patient considering follow-up PET scan if symptoms or significant increase in CEA.  Discuss reimaging if CEA >= 8.0. 3.  RTC in 3 months for MD assessment and labs (CBC with diff, CMP, CEA, ferritin).   Lequita Asal, MD  12/17/2016, 5:44 PM

## 2016-12-17 ENCOUNTER — Other Ambulatory Visit: Payer: Self-pay | Admitting: *Deleted

## 2016-12-17 ENCOUNTER — Inpatient Hospital Stay: Payer: PPO | Attending: Hematology and Oncology | Admitting: Hematology and Oncology

## 2016-12-17 ENCOUNTER — Other Ambulatory Visit: Payer: PPO

## 2016-12-17 ENCOUNTER — Inpatient Hospital Stay: Payer: PPO

## 2016-12-17 ENCOUNTER — Ambulatory Visit: Payer: PPO | Admitting: Hematology and Oncology

## 2016-12-17 VITALS — BP 179/75 | HR 59 | Temp 97.0°F | Resp 18 | Wt 171.3 lb

## 2016-12-17 DIAGNOSIS — Z933 Colostomy status: Secondary | ICD-10-CM | POA: Insufficient documentation

## 2016-12-17 DIAGNOSIS — R600 Localized edema: Secondary | ICD-10-CM

## 2016-12-17 DIAGNOSIS — Z951 Presence of aortocoronary bypass graft: Secondary | ICD-10-CM | POA: Diagnosis not present

## 2016-12-17 DIAGNOSIS — K449 Diaphragmatic hernia without obstruction or gangrene: Secondary | ICD-10-CM | POA: Diagnosis not present

## 2016-12-17 DIAGNOSIS — C189 Malignant neoplasm of colon, unspecified: Secondary | ICD-10-CM

## 2016-12-17 DIAGNOSIS — I129 Hypertensive chronic kidney disease with stage 1 through stage 4 chronic kidney disease, or unspecified chronic kidney disease: Secondary | ICD-10-CM | POA: Diagnosis not present

## 2016-12-17 DIAGNOSIS — Z79899 Other long term (current) drug therapy: Secondary | ICD-10-CM | POA: Insufficient documentation

## 2016-12-17 DIAGNOSIS — C184 Malignant neoplasm of transverse colon: Secondary | ICD-10-CM

## 2016-12-17 DIAGNOSIS — N182 Chronic kidney disease, stage 2 (mild): Secondary | ICD-10-CM | POA: Diagnosis not present

## 2016-12-17 DIAGNOSIS — E7800 Pure hypercholesterolemia, unspecified: Secondary | ICD-10-CM | POA: Insufficient documentation

## 2016-12-17 DIAGNOSIS — K259 Gastric ulcer, unspecified as acute or chronic, without hemorrhage or perforation: Secondary | ICD-10-CM | POA: Diagnosis not present

## 2016-12-17 DIAGNOSIS — Z7982 Long term (current) use of aspirin: Secondary | ICD-10-CM | POA: Diagnosis not present

## 2016-12-17 DIAGNOSIS — G4700 Insomnia, unspecified: Secondary | ICD-10-CM | POA: Diagnosis not present

## 2016-12-17 DIAGNOSIS — C182 Malignant neoplasm of ascending colon: Secondary | ICD-10-CM

## 2016-12-17 DIAGNOSIS — D500 Iron deficiency anemia secondary to blood loss (chronic): Secondary | ICD-10-CM

## 2016-12-17 DIAGNOSIS — K219 Gastro-esophageal reflux disease without esophagitis: Secondary | ICD-10-CM | POA: Diagnosis not present

## 2016-12-17 DIAGNOSIS — I2510 Atherosclerotic heart disease of native coronary artery without angina pectoris: Secondary | ICD-10-CM | POA: Insufficient documentation

## 2016-12-17 DIAGNOSIS — Z9049 Acquired absence of other specified parts of digestive tract: Secondary | ICD-10-CM | POA: Insufficient documentation

## 2016-12-17 LAB — COMPREHENSIVE METABOLIC PANEL
ALT: 14 U/L (ref 14–54)
AST: 21 U/L (ref 15–41)
Albumin: 4.3 g/dL (ref 3.5–5.0)
Alkaline Phosphatase: 57 U/L (ref 38–126)
Anion gap: 11 (ref 5–15)
BUN: 40 mg/dL — ABNORMAL HIGH (ref 6–20)
CO2: 23 mmol/L (ref 22–32)
Calcium: 8.9 mg/dL (ref 8.9–10.3)
Chloride: 103 mmol/L (ref 101–111)
Creatinine, Ser: 1.44 mg/dL — ABNORMAL HIGH (ref 0.44–1.00)
GFR calc Af Amer: 34 mL/min — ABNORMAL LOW (ref >60)
GFR calc non Af Amer: 29 mL/min — ABNORMAL LOW (ref >60)
Glucose, Bld: 124 mg/dL — ABNORMAL HIGH (ref 65–99)
Potassium: 3.9 mmol/L (ref 3.5–5.1)
Sodium: 137 mmol/L (ref 135–145)
Total Bilirubin: 0.5 mg/dL (ref 0.3–1.2)
Total Protein: 7.8 g/dL (ref 6.5–8.1)

## 2016-12-17 LAB — CBC WITH DIFFERENTIAL/PLATELET
Basophils Absolute: 0 10*3/uL (ref 0–0.1)
Basophils Relative: 1 %
Eosinophils Absolute: 0.3 10*3/uL (ref 0–0.7)
Eosinophils Relative: 4 %
HCT: 39.2 % (ref 35.0–47.0)
Hemoglobin: 13 g/dL (ref 12.0–16.0)
Lymphocytes Relative: 19 %
Lymphs Abs: 1.1 10*3/uL (ref 1.0–3.6)
MCH: 29 pg (ref 26.0–34.0)
MCHC: 33.2 g/dL (ref 32.0–36.0)
MCV: 87.4 fL (ref 80.0–100.0)
Monocytes Absolute: 0.5 10*3/uL (ref 0.2–0.9)
Monocytes Relative: 8 %
Neutro Abs: 4 10*3/uL (ref 1.4–6.5)
Neutrophils Relative %: 68 %
Platelets: 214 10*3/uL (ref 150–440)
RBC: 4.48 MIL/uL (ref 3.80–5.20)
RDW: 15.5 % — ABNORMAL HIGH (ref 11.5–14.5)
WBC: 5.9 10*3/uL (ref 3.6–11.0)

## 2016-12-17 LAB — FERRITIN: Ferritin: 24 ng/mL (ref 11–307)

## 2016-12-17 NOTE — Progress Notes (Signed)
Patient states she is not sleeping well.  Wants to know what she can take to make her sleep.  Says she does not sleep during the day.

## 2016-12-18 LAB — CEA: CEA: 5.6 ng/mL — ABNORMAL HIGH (ref 0.0–4.7)

## 2016-12-21 ENCOUNTER — Encounter: Payer: Self-pay | Admitting: Hematology and Oncology

## 2016-12-30 DIAGNOSIS — Z23 Encounter for immunization: Secondary | ICD-10-CM | POA: Diagnosis not present

## 2017-03-18 ENCOUNTER — Encounter: Payer: Self-pay | Admitting: Hematology and Oncology

## 2017-03-18 ENCOUNTER — Inpatient Hospital Stay: Payer: Medicare HMO | Attending: Hematology and Oncology | Admitting: Hematology and Oncology

## 2017-03-18 ENCOUNTER — Inpatient Hospital Stay: Payer: Medicare HMO

## 2017-03-18 VITALS — BP 177/71 | HR 56 | Temp 97.2°F | Resp 20 | Wt 177.0 lb

## 2017-03-18 DIAGNOSIS — C182 Malignant neoplasm of ascending colon: Secondary | ICD-10-CM

## 2017-03-18 DIAGNOSIS — I2510 Atherosclerotic heart disease of native coronary artery without angina pectoris: Secondary | ICD-10-CM | POA: Diagnosis not present

## 2017-03-18 DIAGNOSIS — N182 Chronic kidney disease, stage 2 (mild): Secondary | ICD-10-CM | POA: Diagnosis not present

## 2017-03-18 DIAGNOSIS — D500 Iron deficiency anemia secondary to blood loss (chronic): Secondary | ICD-10-CM

## 2017-03-18 DIAGNOSIS — I129 Hypertensive chronic kidney disease with stage 1 through stage 4 chronic kidney disease, or unspecified chronic kidney disease: Secondary | ICD-10-CM | POA: Insufficient documentation

## 2017-03-18 DIAGNOSIS — C184 Malignant neoplasm of transverse colon: Secondary | ICD-10-CM | POA: Insufficient documentation

## 2017-03-18 DIAGNOSIS — C189 Malignant neoplasm of colon, unspecified: Secondary | ICD-10-CM

## 2017-03-18 DIAGNOSIS — H9113 Presbycusis, bilateral: Secondary | ICD-10-CM

## 2017-03-18 DIAGNOSIS — Z7982 Long term (current) use of aspirin: Secondary | ICD-10-CM | POA: Diagnosis not present

## 2017-03-18 LAB — COMPREHENSIVE METABOLIC PANEL
ALT: 13 U/L — ABNORMAL LOW (ref 14–54)
AST: 21 U/L (ref 15–41)
Albumin: 4.1 g/dL (ref 3.5–5.0)
Alkaline Phosphatase: 51 U/L (ref 38–126)
Anion gap: 7 (ref 5–15)
BUN: 35 mg/dL — ABNORMAL HIGH (ref 6–20)
CO2: 26 mmol/L (ref 22–32)
Calcium: 8.1 mg/dL — ABNORMAL LOW (ref 8.9–10.3)
Chloride: 101 mmol/L (ref 101–111)
Creatinine, Ser: 1.27 mg/dL — ABNORMAL HIGH (ref 0.44–1.00)
GFR calc Af Amer: 40 mL/min — ABNORMAL LOW (ref >60)
GFR calc non Af Amer: 34 mL/min — ABNORMAL LOW (ref >60)
Glucose, Bld: 120 mg/dL — ABNORMAL HIGH (ref 65–99)
Potassium: 3.8 mmol/L (ref 3.5–5.1)
Sodium: 134 mmol/L — ABNORMAL LOW (ref 135–145)
Total Bilirubin: 0.5 mg/dL (ref 0.3–1.2)
Total Protein: 7.3 g/dL (ref 6.5–8.1)

## 2017-03-18 LAB — CBC WITH DIFFERENTIAL/PLATELET
Basophils Absolute: 0 10*3/uL (ref 0–0.1)
Basophils Relative: 1 %
Eosinophils Absolute: 0.2 10*3/uL (ref 0–0.7)
Eosinophils Relative: 4 %
HCT: 38.9 % (ref 35.0–47.0)
Hemoglobin: 13.2 g/dL (ref 12.0–16.0)
Lymphocytes Relative: 18 %
Lymphs Abs: 0.9 10*3/uL — ABNORMAL LOW (ref 1.0–3.6)
MCH: 29.6 pg (ref 26.0–34.0)
MCHC: 34 g/dL (ref 32.0–36.0)
MCV: 87.1 fL (ref 80.0–100.0)
Monocytes Absolute: 0.5 10*3/uL (ref 0.2–0.9)
Monocytes Relative: 11 %
Neutro Abs: 3.3 10*3/uL (ref 1.4–6.5)
Neutrophils Relative %: 66 %
Platelets: 219 10*3/uL (ref 150–440)
RBC: 4.46 MIL/uL (ref 3.80–5.20)
RDW: 14.7 % — ABNORMAL HIGH (ref 11.5–14.5)
WBC: 4.9 10*3/uL (ref 3.6–11.0)

## 2017-03-18 LAB — FERRITIN: Ferritin: 13 ng/mL (ref 11–307)

## 2017-03-18 NOTE — Progress Notes (Signed)
[facility] day:  03/18/2017   Chief Complaint: Alexis Haas is a 84 y.o. female with stage III colon cancer and iron deficiency anemia who is seen for a 3 month assessment.  HPI:  The patient was last seen in the medical oncology clinic on 12/17/2016.  At that time, she felt "alright".  She denied any bowel symptoms.  Exam was stable.  Hematocrit was 39.2.  During the interim, patient is doing "pretty good". She has mobility limitations. She notes that she cannot hang her clothes in her closet. She ambulates with the use of a rolling walker. Patient denies bleeding; no hematochezia, melena, or vaginal bleeding. Patient is having "swelling" to the RIGHT side of her abdomen. She denies pain.   Patient is eating well. Her weight has increased by 6 pounds since her last visit.    Past Medical History:  Diagnosis Date  . Anemia   . Arthritis   . Cancer ([place]) 04/15/2016   T3, N1a adenocarcinoma of the hepatic flexure. No mismatch repair gene abnormality.   . Chronic kidney disease    LABS 3/18 COMPARED WITH 1 YEAR AGO  . Coronary artery disease   . Fall 2018  . GERD (gastroesophageal reflux disease)   . Heart disease   . Hypercholesteremia   . Hypertension   . Insomnia     Past Surgical History:  Procedure Laterality Date  . CARDIAC SURGERY  2005   bypass  . COLONOSCOPY WITH PROPOFOL N/A 04/15/2016   Procedure: COLONOSCOPY WITH PROPOFOL;  Surgeon: Lucilla Lame, MD;  Location: [facility] ENDOSCOPY;  Service: Endoscopy;  Laterality: N/A;  . CORONARY ARTERY BYPASS GRAFT  2005   x 2  . ESOPHAGOGASTRODUODENOSCOPY (EGD) WITH PROPOFOL N/A 04/15/2016   Procedure: ESOPHAGOGASTRODUODENOSCOPY (EGD) WITH PROPOFOL;  Surgeon: Lucilla Lame, MD;  Location: [facility] ENDOSCOPY;  Service: Endoscopy;  Laterality: N/A;  . LAPAROSCOPIC RIGHT COLECTOMY Right 06/05/2016   Procedure: LAPAROSCOPIC RIGHT COLECTOMY;  Surgeon: Robert Bellow, MD;  Location: [facility] ORS;  Service:  General;  Laterality: Right;  . toe removal    . TONSILLECTOMY     age 58  . UMBILICAL HERNIA REPAIR  06/05/2016   Procedure: HERNIA REPAIR UMBILICAL ADULT;  Surgeon: Robert Bellow, MD;  Location: [facility] ORS;  Service: General;;    History reviewed. No pertinent family history.  Social History:  reports that  has never smoked. she has never used smokeless tobacco. She reports that she does not drink alcohol or use drugs.  She states that she has worked her whole life (since the age of 22).  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  She lives in [place] at [place].  She lives alone.  Her husband died 62 years ago.  Her daughter is Theodora Blow (home: [phone]; work: [phone]).  The patient is alone today.  Allergies:  Allergies  Allergen Reactions  . Amoxicillin Rash    Has patient had a PCN reaction causing immediate rash, facial/tongue/throat swelling, SOB or lightheadedness with hypotension:Yes Has patient had a PCN reaction causing severe rash involving mucus membranes or skin necrosis:unsure Has patient had a PCN reaction that required hospitalization:No Has patient had a PCN reaction occurring within the last 10 years:No If all of the above answers are "NO", then may proceed with Cephalosporin use.     Current Medications: Current Outpatient Medications  Medication Sig Dispense Refill  . aspirin EC  81 MG tablet Take 81 mg by mouth daily.    . hydrochlorothiazide (HYDRODIURIL) 25 MG tablet Take 25 mg by mouth daily.    Marland Kitchen lovastatin (MEVACOR) 40 MG tablet Take 40 mg by mouth at bedtime.    . metoprolol tartrate (LOPRESSOR) 25 MG tablet Take 25 mg by mouth 2 (two) times daily.    . naproxen sodium (ANAPROX) 220 MG tablet Take 220 mg by mouth daily as needed (for pain).     Marland Kitchen omeprazole (PRILOSEC) 20 MG capsule Take 20 mg by mouth at bedtime.      No current facility-administered medications for this visit.      Review of Systems:  GENERAL:  Feels "good".  "Hanging in there".  No fevers or sweats.  Weight up 6 pounds. PERFORMANCE STATUS (ECOG):  1 HEENT:  No visual changes, sore throat, mouth sores or tenderness. Lungs: No shortness of breath or cough.  No hemoptysis. Cardiac:  No chest pain, palpitations, orthopnea, or PND. GI:  Eating well.  No nausea, vomiting, diarrhea, constipation, melena or hematochezia. GU:  No urgency, frequency, dysuria, or hematuria. Musculoskeletal:  No back pain.  Arthritis in left knee (wears brace).  Can't lift right arm fully up secondary to arthritis (no change).  No muscle tenderness. Extremities:  No pain or swelling. Skin:  No rashes or skin changes. Neuro:  Forgetful.  No headache, numbness or weakness, balance or coordination issues. Endocrine:  No diabetes, thyroid issues, hot flashes or night sweats. Psych:  No mood changes, depression or anxiety. Pain:  No focal pain. Review of systems:  All other systems reviewed and found to be negative.  Physical Exam: Blood pressure (!) 177/71, pulse (!) 56, temperature (!) 97.2 F (36.2 C), temperature source Tympanic, resp. rate 20, weight 177 lb 0.5 oz (80.3 kg). GENERAL:  Elderly woman sitting comfortably in the exam room in no acute distress.  She has a rolling walker at her side.  She needs assistance onto the exam table. MENTAL STATUS:  Alert and oriented to person, place and time. HEAD:  Curly gray hair.  Normocephalic, face symmetric, no Cushingoid features. EYES:  Glasses.  Blue eyes.  Pupils equal round and reactive to light and accomodation.  No conjunctivitis or scleral icterus. ENT:  Oropharynx clear without lesion.  Dentures.  Tongue normal. Mucous membranes moist.  RESPIRATORY:  Clear to auscultation without rales, wheezes or rhonchi. CARDIOVASCULAR:  Regular rate and rhythm without murmur, rub or gallop.  No JVD. ABDOMEN:  Incisional peri-umbilical hernia. Soft, non-tender, with active bowel  sounds, and no hepatosplenomegaly.  No masses. SKIN:  No rashes, ulcers or lesions. EXTREMITIES: Arthritic changes in hands.  Left knee brace.  2+ left and 1+ right lower extremity edema.  No skin discoloration or tenderness.  No palpable cords. LYMPH NODES: No palpable cervical, supraclavicular, axillary or inguinal adenopathy  NEUROLOGICAL: Unremarkable. PSYCH:  Appropriate.   Appointment on 03/18/2017  Component Date Value Ref Range Status  . Sodium 03/18/2017 134* 135 - 145 mmol/L Final  . Potassium 03/18/2017 3.8  3.5 - 5.1 mmol/L Final  . Chloride 03/18/2017 101  101 - 111 mmol/L Final  . CO2 03/18/2017 26  22 - 32 mmol/L Final  . Glucose, Bld 03/18/2017 120* 65 - 99 mg/dL Final  . BUN 03/18/2017 35* 6 - 20 mg/dL Final  . Creatinine, Ser 03/18/2017 1.27* 0.44 - 1.00 mg/dL Final  . Calcium 03/18/2017 8.1* 8.9 - 10.3 mg/dL Final  . Total Protein 03/18/2017 7.3  6.5 -  8.1 g/dL Final  . Albumin 03/18/2017 4.1  3.5 - 5.0 g/dL Final  . AST 03/18/2017 21  15 - 41 U/L Final  . ALT 03/18/2017 13* 14 - 54 U/L Final  . Alkaline Phosphatase 03/18/2017 51  38 - 126 U/L Final  . Total Bilirubin 03/18/2017 0.5  0.3 - 1.2 mg/dL Final  . GFR calc non Af Amer 03/18/2017 34* >60 mL/min Final  . GFR calc Af Amer 03/18/2017 40* >60 mL/min Final   Comment: (NOTE) The eGFR has been calculated using the CKD EPI equation. This calculation has not been validated in all clinical situations. eGFR's persistently <60 mL/min signify possible Chronic Kidney Disease.   Georgiann Hahn gap 03/18/2017 7  5 - 15 Final   Performed at [facility], [address], [place], [place] [address]  . WBC 03/18/2017 4.9  3.6 - 11.0 K/uL Final  . RBC 03/18/2017 4.46  3.80 - 5.20 MIL/uL Final  . Hemoglobin 03/18/2017 13.2  12.0 - 16.0 g/dL Final  . HCT 03/18/2017 38.9  35.0 - 47.0 % Final  . MCV 03/18/2017 87.1  80.0 - 100.0 fL Final  . MCH 03/18/2017 29.6  26.0 - 34.0 pg Final  . MCHC 03/18/2017 34.0  32.0 -  36.0 g/dL Final  . RDW 03/18/2017 14.7* 11.5 - 14.5 % Final  . Platelets 03/18/2017 219  150 - 440 K/uL Final  . Neutrophils Relative % 03/18/2017 66  % Final  . Neutro Abs 03/18/2017 3.3  1.4 - 6.5 K/uL Final  . Lymphocytes Relative 03/18/2017 18  % Final  . Lymphs Abs 03/18/2017 0.9* 1.0 - 3.6 K/uL Final  . Monocytes Relative 03/18/2017 11  % Final  . Monocytes Absolute 03/18/2017 0.5  0.2 - 0.9 K/uL Final  . Eosinophils Relative 03/18/2017 4  % Final  . Eosinophils Absolute 03/18/2017 0.2  0 - 0.7 K/uL Final  . Basophils Relative 03/18/2017 1  % Final  . Basophils Absolute 03/18/2017 0.0  0 - 0.1 K/uL Final   Performed at [facility], [address], [place], [place] [id number]    Assessment:  Alexis Haas is a 84 y.o. 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 Pathology revealed a 3.7 cm moderately differentiated adenocarcinoma. Tumor invaded the muscularis propria into the pericolorectal tissue. One of 28 lymph nodes were positive. Margins were negative. Pathologic stage was T3N1a (stage III). MSI/MMR was intact.  PET scan on 05/19/2016 revealed markedly hypermetabolic lesion in the distal ascending colon extending into the proximal hepatic flexure, consistent with the known primary malignancy.  There was a tiny focus of FDG uptake in the medial right liver, just minimally above the background expected mottled hepatic uptake. CT imaging suggests there may be a tiny 9 mm hypoattenuating focus at this location. While not definite, imaging features to raise concern for the possibility of metastatic disease in the liver.    CEA has been followed:  6.4 on 05/07/2016, 4.8 on 06/25/2016, 5.4 on 09/10/2016, 5.6 on 12/17/2016, and 5.3 on 03/18/2017.  She presented with chronic iron deficiency anemia secondary to blood loss.  Invasive procedures (EGD or colonoscopy)  were initially deferred secondary to her age.  Guaiac cards were negative (12/2014 and 10/2015) then positive (01/2016).  Diet is modest.  EGD on 04/15/2016 revealed a medium size hiatal hernia, nonbleeding gastric ulcer with no stigmata of bleeding.  Oral iron is ineffective.  She received  2 units PRBCs on 10/11/2015 for a hematocrit of 21.1 and hemoglobin 6.4.  She has received Feraheme on several occasions:  01/11/2015, 02/01/2015, 03/15/2015, 03/22/2015, 10/16/2015, 01/09/2016, 03/05/2016, and on 05/09/2016.    Ferritin has been monitored: 5 on 01/02/2015, 36 on 01/31/2105, 26 on 03/08/2015, 184 on 05/02/2015, 7 on 10/10/2015, 91 on 11/07/2015, 7 on 01/02/2016, 14 on 03/03/2016, 33 on 04/07/2016,12 on 05/07/2016, 21 on 06/25/2016, 14 on 07/23/2016, 14 on 09/10/2016, 24 on 12/17/2016, and 13 on 03/18/2017.  She has chronic mild renal insufficiency.  Creatinine is 1.27 (CrCl 34 ml/min) today.  She has chronic lower extremity edema (left > right).  Left lower extremity duplex on 07/23/2016 revealed no evidence of DVT.  Symptomatically, she feels "pretty good".  She denies any bowel symptoms or bleeding. Exam reveals peri-umbilical incisional hernia. Area is not painful and there is no evidence of strangulation. Hematocrit is 38.9.  Ferritin is 13.  Calcium is 8.1.  Plan: 1.  Labs today:  CBC with diff, CMP, CEA, ferritin. 2.  Discuss worsening presbycusis. Patient asking for recommendations on where to get hearing aids. Will refer to ENT Kathyrn Sheriff) for further evaluation.  3.  Discuss HYPOcalcemia. Calcium today is 8.1. Patient instructed to start calcium 1200 mg and vitamin D 800 IU daily.  4.  Discuss peri-umbilical hernia. This is an incisional defect. Area not causing patient pain and there is no evidence of strangulation. Discuss that intervention would be mainly cosmetic unless area becomes symptomatic. 5.  Review list of prepared questions about numerous health issues.  Questions reviewed  and answered. 6.  RTC in 3 months for MD assessment and labs (CBC with diff, CMP, CEA, ferritin).   Honor Loh, NP  03/18/2017,2:48 PM   I saw and evaluated the patient, participating in the key portions of the service and reviewing pertinent diagnostic studies and records.  I reviewed the nurse practitioner's note and agree with the findings and the plan.  The assessment and plan were discussed with the patient.  Several questions were asked by the patient and answered.   Nolon Stalls, MD 03/18/2017,2:48 PM

## 2017-03-18 NOTE — Patient Instructions (Signed)
Your calcium is low at 8.1. Please start taking Calcium 1200 mg and Vitamin D 800 IU daily.

## 2017-03-18 NOTE — Progress Notes (Signed)
Patient states she will be 84 y/o this year.  States she does not hear well and does not see well to read.   Hassan Rowan, Triage RN called stating patient's family member called to let MD know that patent has a protrusion @ an old surgical site that needs to be assessed.  States patient will not tell us about it.

## 2017-03-19 LAB — CEA: CEA: 5.3 ng/mL — ABNORMAL HIGH (ref 0.0–4.7)

## 2017-03-21 HISTORY — DX: Hypocalcemia: E83.51

## 2017-03-23 ENCOUNTER — Telehealth: Payer: Self-pay | Admitting: *Deleted

## 2017-03-23 NOTE — Telephone Encounter (Signed)
-----   Message from Lequita Asal, MD sent at 03/21/2017 12:59 PM EST ----- Regarding: Please call patient  Hematocrit is normal.  Ferritin is 13.  Encourage iron rich foods.  She could consider 1 iron tablet a day with OJ or vitamin C.  She may respond now as her tumor is gone.  M

## 2017-03-23 NOTE — Telephone Encounter (Signed)
Called patient to inform her that MD recommends she eat iron rich foods.  Gave examples - red meat, dark green leafy vegetables and to take an iron tablet daily with orange juice or Vitamin C.  Patient verbalized understanding.

## 2017-03-23 NOTE — Telephone Encounter (Signed)
Attempted to call patient regarding oral iron and iron rich diet.  No answer.  Will try again later.

## 2017-06-17 ENCOUNTER — Inpatient Hospital Stay: Payer: Medicare HMO

## 2017-06-17 ENCOUNTER — Inpatient Hospital Stay: Payer: Medicare HMO | Attending: Hematology and Oncology | Admitting: Hematology and Oncology

## 2017-06-17 ENCOUNTER — Encounter: Payer: Self-pay | Admitting: Hematology and Oncology

## 2017-06-17 VITALS — BP 155/65 | HR 47 | Temp 96.6°F | Resp 18 | Wt 176.8 lb

## 2017-06-17 DIAGNOSIS — R609 Edema, unspecified: Secondary | ICD-10-CM

## 2017-06-17 DIAGNOSIS — Z79899 Other long term (current) drug therapy: Secondary | ICD-10-CM | POA: Diagnosis not present

## 2017-06-17 DIAGNOSIS — N189 Chronic kidney disease, unspecified: Secondary | ICD-10-CM

## 2017-06-17 DIAGNOSIS — D500 Iron deficiency anemia secondary to blood loss (chronic): Secondary | ICD-10-CM | POA: Diagnosis not present

## 2017-06-17 DIAGNOSIS — Z7189 Other specified counseling: Secondary | ICD-10-CM

## 2017-06-17 DIAGNOSIS — Z7982 Long term (current) use of aspirin: Secondary | ICD-10-CM | POA: Diagnosis not present

## 2017-06-17 DIAGNOSIS — C184 Malignant neoplasm of transverse colon: Secondary | ICD-10-CM

## 2017-06-17 DIAGNOSIS — I129 Hypertensive chronic kidney disease with stage 1 through stage 4 chronic kidney disease, or unspecified chronic kidney disease: Secondary | ICD-10-CM | POA: Diagnosis not present

## 2017-06-17 DIAGNOSIS — C182 Malignant neoplasm of ascending colon: Secondary | ICD-10-CM

## 2017-06-17 HISTORY — DX: Other specified counseling: Z71.89

## 2017-06-17 LAB — COMPREHENSIVE METABOLIC PANEL
ALT: 15 U/L (ref 14–54)
AST: 20 U/L (ref 15–41)
Albumin: 4.3 g/dL (ref 3.5–5.0)
Alkaline Phosphatase: 42 U/L (ref 38–126)
Anion gap: 10 (ref 5–15)
BUN: 28 mg/dL — ABNORMAL HIGH (ref 6–20)
CO2: 25 mmol/L (ref 22–32)
Calcium: 8.7 mg/dL — ABNORMAL LOW (ref 8.9–10.3)
Chloride: 103 mmol/L (ref 101–111)
Creatinine, Ser: 1.19 mg/dL — ABNORMAL HIGH (ref 0.44–1.00)
GFR calc Af Amer: 43 mL/min — ABNORMAL LOW (ref >60)
GFR calc non Af Amer: 37 mL/min — ABNORMAL LOW (ref >60)
Glucose, Bld: 109 mg/dL — ABNORMAL HIGH (ref 65–99)
Potassium: 4.1 mmol/L (ref 3.5–5.1)
Sodium: 138 mmol/L (ref 135–145)
Total Bilirubin: 0.6 mg/dL (ref 0.3–1.2)
Total Protein: 7.7 g/dL (ref 6.5–8.1)

## 2017-06-17 LAB — CBC WITH DIFFERENTIAL/PLATELET
Basophils Absolute: 0 10*3/uL (ref 0–0.1)
Basophils Relative: 1 %
Eosinophils Absolute: 0.3 10*3/uL (ref 0–0.7)
Eosinophils Relative: 5 %
HCT: 40.8 % (ref 35.0–47.0)
Hemoglobin: 13.9 g/dL (ref 12.0–16.0)
Lymphocytes Relative: 19 %
Lymphs Abs: 0.9 10*3/uL — ABNORMAL LOW (ref 1.0–3.6)
MCH: 30.1 pg (ref 26.0–34.0)
MCHC: 34 g/dL (ref 32.0–36.0)
MCV: 88.4 fL (ref 80.0–100.0)
Monocytes Absolute: 0.5 10*3/uL (ref 0.2–0.9)
Monocytes Relative: 10 %
Neutro Abs: 3.2 10*3/uL (ref 1.4–6.5)
Neutrophils Relative %: 65 %
Platelets: 209 10*3/uL (ref 150–440)
RBC: 4.62 MIL/uL (ref 3.80–5.20)
RDW: 14.5 % (ref 11.5–14.5)
WBC: 4.8 10*3/uL (ref 3.6–11.0)

## 2017-06-17 NOTE — Progress Notes (Signed)
Patient offers no complaints today. 

## 2017-06-17 NOTE — Progress Notes (Signed)
[facility] day:  06/17/2017   Chief Complaint: Alexis Haas is a 84 y.o. female with stage III colon cancer and iron deficiency anemia who is seen for a 3 month assessment.  HPI:  The patient was last seen in the medical oncology clinic on 03/18/2017.  At that time, she felt "pretty good".  She denied any bowel symptoms or bleeding. Exam revealed peri-umbilical incisional hernia. Area was not painful and there was no evidence of strangulation. Hematocrit was 38.9.  Ferritin was 13.  Calcium was 8.1.  We discussed oral calcium and iron.  During the interim, she has felt "like usual".  She denies any abdominal symptoms.  She is eating "too well".  She stats that her knees don't hurt if she doesn't stand on them (walk).  She is not wearing her knee brace.  She still can't lift her arms very high.    She denies any melena or hematochezia.  She is taking iron with orange juice.    Past Medical History:  Diagnosis Date  . Anemia   . Arthritis   . Cancer ([place]) 04/15/2016   T3, N1a adenocarcinoma of the hepatic flexure. No mismatch repair gene abnormality.   . Chronic kidney disease    LABS 3/18 COMPARED WITH 1 YEAR AGO  . Coronary artery disease   . Fall 2018  . GERD (gastroesophageal reflux disease)   . Heart disease   . Hypercholesteremia   . Hypertension   . Insomnia     Past Surgical History:  Procedure Laterality Date  . CARDIAC SURGERY  2005   bypass  . COLONOSCOPY WITH PROPOFOL N/A 04/15/2016   Procedure: COLONOSCOPY WITH PROPOFOL;  Surgeon: Lucilla Lame, MD;  Location: [facility] ENDOSCOPY;  Service: Endoscopy;  Laterality: N/A;  . CORONARY ARTERY BYPASS GRAFT  2005   x 2  . ESOPHAGOGASTRODUODENOSCOPY (EGD) WITH PROPOFOL N/A 04/15/2016   Procedure: ESOPHAGOGASTRODUODENOSCOPY (EGD) WITH PROPOFOL;  Surgeon: Lucilla Lame, MD;  Location: [facility] ENDOSCOPY;  Service: Endoscopy;  Laterality: N/A;  . LAPAROSCOPIC RIGHT COLECTOMY Right 06/05/2016   Procedure: LAPAROSCOPIC RIGHT COLECTOMY;  Surgeon: Robert Bellow, MD;  Location: [facility] ORS;  Service: General;  Laterality: Right;  . toe removal    . TONSILLECTOMY     age 12  . UMBILICAL HERNIA REPAIR  06/05/2016   Procedure: HERNIA REPAIR UMBILICAL ADULT;  Surgeon: Robert Bellow, MD;  Location: [facility] ORS;  Service: General;;    History reviewed. No pertinent family history.  Social History:  reports that she has never smoked. She has never used smokeless tobacco. She reports that she does not drink alcohol or use drugs.  She states that she has worked her whole life (since the age of 26).  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  She lives in [place] at [place].  She lives alone.  Her husband died 55 years ago.  Her daughter is Theodora Blow (home: [phone]; work: [phone]).  The patient is alone today.  Allergies:  Allergies  Allergen Reactions  . Amoxicillin Rash    Has patient had a PCN reaction causing immediate rash, facial/tongue/throat swelling, SOB or lightheadedness with hypotension:Yes Has patient had a PCN reaction causing severe rash involving mucus membranes or skin necrosis:unsure Has patient had a PCN reaction that required hospitalization:No Has patient had a PCN reaction occurring within the last 10 years:No If all of the above answers are "  NO", then may proceed with Cephalosporin use.     Current Medications: Current Outpatient Medications  Medication Sig Dispense Refill  . aspirin EC 81 MG tablet Take 81 mg by mouth daily.    . ferrous sulfate 325 (65 FE) MG tablet Take 325 mg by mouth daily with breakfast.    . hydrochlorothiazide (HYDRODIURIL) 25 MG tablet Take 25 mg by mouth daily.    Marland Kitchen lovastatin (MEVACOR) 40 MG tablet Take 40 mg by mouth at bedtime.    . metoprolol tartrate (LOPRESSOR) 25 MG tablet Take 25 mg by mouth 2 (two) times daily.    . naproxen sodium (ANAPROX) 220 MG tablet Take 220  mg by mouth daily as needed (for pain).     Marland Kitchen omeprazole (PRILOSEC) 20 MG capsule Take 20 mg by mouth at bedtime.      No current facility-administered medications for this visit.     Review of Systems:  GENERAL:  Feels "the same".  No fevers, sweats.  Weight down 1 pound. PERFORMANCE STATUS (ECOG):  1 HEENT:  No visual changes, runny nose, sore throat, mouth sores or tenderness. Lungs: No shortness of breath or cough.  No hemoptysis. Cardiac:  No chest pain, palpitations, orthopnea, or PND. GI:  Eating too well.  No nausea, vomiting, diarrhea, constipation, melena or hematochezia. GU:  No urgency, frequency, dysuria, or hematuria. Musculoskeletal:  No back pain.  Arthritis in left knee.  Can't lift arms too high.  No muscle tenderness. Extremities:  No pain or swelling. Skin:  No rashes or skin changes. Neuro:  Forgetful.  No headache, numbness or weakness, balance or coordination issues. Endocrine:  No diabetes, thyroid issues, hot flashes or night sweats. Psych:  No mood changes, depression or anxiety. Pain:  No focal pain. Review of systems:  All other systems reviewed and found to be negative.   Physical Exam: Blood pressure (!) 155/65, pulse (!) 47, temperature (!) 96.6 F (35.9 C), temperature source Tympanic, resp. rate 18, weight 176 lb 12.9 oz (80.2 kg). GENERAL:  Elderly woman sitting comfortably in the exam room in no acute distress.  She has a rolling walker at her side.  She needs assistance onto the exam table. MENTAL STATUS:  Alert and oriented to person, place and time. HEAD:  Pearline Cables hair.  Normocephalic, atraumatic, face symmetric, no Cushingoid features. EYES:  Glasses.  Blue eyes.  Pupils equal round and reactive to light and accomodation.  No conjunctivitis or scleral icterus. ENT:  Oropharynx clear without lesion.  Tongue normal.  Dentures.  Mucous membranes moist.  RESPIRATORY:  Clear to auscultation without rales, wheezes or rhonchi. CARDIOVASCULAR:  Regular rate  and rhythm without murmur, rub or gallop. ABDOMEN:  Soft, non-tender, with active bowel sounds, and no hepatosplenomegaly.  No masses. SKIN:  No rashes, ulcers or lesions. EXTREMITIES: Arthritic changes in hands.  1+ chronic lower extremity edema (right > left).  No skin discoloration or tenderness.  No palpable cords. LYMPH NODES: No palpable cervical, supraclavicular, axillary or inguinal adenopathy  NEUROLOGICAL: Unremarkable. PSYCH:  Appropriate.    Appointment on 06/17/2017  Component Date Value Ref Range Status  . Ferritin 06/17/2017 33  11 - 307 ng/mL Final   Performed at [facility], [address] [place], [place] [address]  . CEA 06/17/2017 4.9* 0.0 - 4.7 ng/mL Final   Comment: (NOTE)  Nonsmokers          <3.9                             Smokers             <5.6 [employer] Electrochemiluminescence Immunoassay (ECLIA) Values obtained with different assay methods or kits cannot be used interchangeably.  Results cannot be interpreted as absolute evidence of the presence or absence of malignant disease. Performed At: [facility] [place], [place] [id number] Rush Farmer MD EG:[id number] Performed at [facility], [address], [place], [place] [address]   . Sodium 06/17/2017 138  135 - 145 mmol/L Final  . Potassium 06/17/2017 4.1  3.5 - 5.1 mmol/L Final  . Chloride 06/17/2017 103  101 - 111 mmol/L Final  . CO2 06/17/2017 25  22 - 32 mmol/L Final  . Glucose, Bld 06/17/2017 109* 65 - 99 mg/dL Final  . BUN 06/17/2017 28* 6 - 20 mg/dL Final  . Creatinine, Ser 06/17/2017 1.19* 0.44 - 1.00 mg/dL Final  . Calcium 06/17/2017 8.7* 8.9 - 10.3 mg/dL Final  . Total Protein 06/17/2017 7.7  6.5 - 8.1 g/dL Final  . Albumin 06/17/2017 4.3  3.5 - 5.0 g/dL Final  . AST 06/17/2017 20  15 - 41 U/L Final  . ALT 06/17/2017 15  14 - 54 U/L Final  . Alkaline Phosphatase 06/17/2017 42  38 - 126 U/L Final  .  Total Bilirubin 06/17/2017 0.6  0.3 - 1.2 mg/dL Final  . GFR calc non Af Amer 06/17/2017 37* >60 mL/min Final  . GFR calc Af Amer 06/17/2017 43* >60 mL/min Final   Comment: (NOTE) The eGFR has been calculated using the CKD EPI equation. This calculation has not been validated in all clinical situations. eGFR's persistently <60 mL/min signify possible Chronic Kidney Disease.   . Anion gap 06/17/2017 10  5 - 15 Final   Performed at [facility], [address], [place], [place] [address]  . WBC 06/17/2017 4.8  3.6 - 11.0 K/uL Final  . RBC 06/17/2017 4.62  3.80 - 5.20 MIL/uL Final  . Hemoglobin 06/17/2017 13.9  12.0 - 16.0 g/dL Final  . HCT 06/17/2017 40.8  35.0 - 47.0 % Final  . MCV 06/17/2017 88.4  80.0 - 100.0 fL Final  . MCH 06/17/2017 30.1  26.0 - 34.0 pg Final  . MCHC 06/17/2017 34.0  32.0 - 36.0 g/dL Final  . RDW 06/17/2017 14.5  11.5 - 14.5 % Final  . Platelets 06/17/2017 209  150 - 440 K/uL Final  . Neutrophils Relative % 06/17/2017 65  % Final  . Neutro Abs 06/17/2017 3.2  1.4 - 6.5 K/uL Final  . Lymphocytes Relative 06/17/2017 19  % Final  . Lymphs Abs 06/17/2017 0.9* 1.0 - 3.6 K/uL Final  . Monocytes Relative 06/17/2017 10  % Final  . Monocytes Absolute 06/17/2017 0.5  0.2 - 0.9 K/uL Final  . Eosinophils Relative 06/17/2017 5  % Final  . Eosinophils Absolute 06/17/2017 0.3  0 - 0.7 K/uL Final  . Basophils Relative 06/17/2017 1  % Final  . Basophils Absolute 06/17/2017 0.0  0 - 0.1 K/uL Final   Performed at [facility], [address], [place], [place] [address]    Assessment:  DABNEY DEVER is a 84 y.o. 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 Pathology revealed a 3.7 cm moderately differentiated adenocarcinoma. Tumor invaded the muscularis propria into the pericolorectal tissue. One of 28 lymph nodes were positive. Margins were  negative. Pathologic stage was T3N1a (stage III). MSI/MMR was intact.  PET scan on 05/19/2016 revealed markedly hypermetabolic lesion in the distal ascending colon extending into the proximal hepatic flexure, consistent with the known primary malignancy.  There was a tiny focus of FDG uptake in the medial right liver, just minimally above the background expected mottled hepatic uptake. CT imaging suggests there may be a tiny 9 mm hypoattenuating focus at this location. While not definite, imaging features to raise concern for the possibility of metastatic disease in the liver.    CEA has been followed:  6.4 on 05/07/2016, 4.8 on 06/25/2016, 5.4 on 09/10/2016, 5.6 on 12/17/2016, 5.3 on 03/18/2017, and 4.9 on 06/17/2017.  She presented with chronic iron deficiency anemia secondary to blood loss.  Invasive procedures (EGD or colonoscopy) were initially deferred secondary to her age.  Guaiac cards were negative (12/2014 and 10/2015) then positive (01/2016).  Diet is modest.  EGD on 04/15/2016 revealed a medium size hiatal hernia, nonbleeding gastric ulcer with no stigmata of bleeding.  Oral iron is ineffective.  She received 2 units PRBCs on 10/11/2015 for a hematocrit of 21.1 and hemoglobin 6.4.  She has received Feraheme on several occasions:  01/11/2015, 02/01/2015, 03/15/2015, 03/22/2015, 10/16/2015, 01/09/2016, 03/05/2016, and on 05/09/2016.    Ferritin has been monitored: 5 on 01/02/2015, 36 on 01/31/2105, 26 on 03/08/2015, 184 on 05/02/2015, 7 on 10/10/2015, 91 on 11/07/2015, 7 on 01/02/2016, 14 on 03/03/2016, 33 on 04/07/2016,12 on 05/07/2016, 21 on 06/25/2016, 14 on 07/23/2016, 14 on 09/10/2016, 24 on 12/17/2016, 13 on 03/18/2017, and 33 on 06/17/2017.  She has chronic mild renal insufficiency.  Creatinine is 1.19 (improved) today.  She has chronic lower extremity edema (left > right).  Left lower extremity duplex on 07/23/2016 revealed no evidence of DVT.  Symptomatically, she feels  "the same".  She denies any bowel symptoms or bleeding. Exam is stable.  Hematocrit is 40.8.  Ferritin is 33.  Calcium is 8.7.  Plan: 1.  Labs today:  CBC with diff, CMP, CEA, ferritin. 2.  Discuss plan for yearly imaging.  Patient unsure if she wishes to pursue. 3.  Patient to call us back re: decision for imaging (yearly) to follow-up colon cancer. 4.  Continue oral iron with vitamin C.  Ferritin goal is 100. 5.  Continue oral calcium. 6.  RTC in 4 months for MD assessment and labs (CBC with diff, CMP, CEA, ferritin).   Lequita Asal, MD  06/17/2017, 5:35 PM

## 2017-06-18 ENCOUNTER — Other Ambulatory Visit: Payer: Self-pay | Admitting: Urgent Care

## 2017-06-18 DIAGNOSIS — C182 Malignant neoplasm of ascending colon: Secondary | ICD-10-CM

## 2017-06-18 LAB — FERRITIN: Ferritin: 33 ng/mL (ref 11–307)

## 2017-06-18 LAB — CEA: CEA: 4.9 ng/mL — ABNORMAL HIGH (ref 0.0–4.7)

## 2017-06-26 ENCOUNTER — Ambulatory Visit
Admission: RE | Admit: 2017-06-26 | Discharge: 2017-06-26 | Disposition: A | Discharge status: Home / Self Care | Payer: Medicare HMO | Source: Ambulatory Visit | Attending: Urgent Care | Admitting: Urgent Care

## 2017-06-26 DIAGNOSIS — I2510 Atherosclerotic heart disease of native coronary artery without angina pectoris: Secondary | ICD-10-CM | POA: Insufficient documentation

## 2017-06-26 DIAGNOSIS — K449 Diaphragmatic hernia without obstruction or gangrene: Secondary | ICD-10-CM | POA: Insufficient documentation

## 2017-06-26 DIAGNOSIS — I700 Atherosclerosis of aorta: Secondary | ICD-10-CM | POA: Insufficient documentation

## 2017-06-26 DIAGNOSIS — I517 Cardiomegaly: Secondary | ICD-10-CM | POA: Insufficient documentation

## 2017-06-26 DIAGNOSIS — Z7982 Long term (current) use of aspirin: Secondary | ICD-10-CM | POA: Insufficient documentation

## 2017-06-26 DIAGNOSIS — Z79899 Other long term (current) drug therapy: Secondary | ICD-10-CM | POA: Insufficient documentation

## 2017-06-26 DIAGNOSIS — C182 Malignant neoplasm of ascending colon: Secondary | ICD-10-CM | POA: Diagnosis present

## 2017-06-26 DIAGNOSIS — K439 Ventral hernia without obstruction or gangrene: Secondary | ICD-10-CM | POA: Diagnosis not present

## 2017-06-26 LAB — GLUCOSE, CAPILLARY: Glucose-Capillary: 103 mg/dL — ABNORMAL HIGH (ref 65–99)

## 2017-06-26 MED ORDER — FLUDEOXYGLUCOSE F - 18 (FDG) INJECTION
9.2300 | Freq: Once | INTRAVENOUS | Status: AC | PRN
  Administered 2017-06-26: 9.23 via INTRAVENOUS

## 2017-07-10 ENCOUNTER — Telehealth: Payer: Self-pay | Admitting: *Deleted

## 2017-07-10 NOTE — Telephone Encounter (Signed)
Called patient to let her know her PET scan is good.

## 2017-10-21 ENCOUNTER — Other Ambulatory Visit: Payer: Self-pay | Admitting: *Deleted

## 2017-10-21 ENCOUNTER — Inpatient Hospital Stay: Payer: Medicare HMO

## 2017-10-21 ENCOUNTER — Encounter: Payer: Self-pay | Admitting: Hematology and Oncology

## 2017-10-21 ENCOUNTER — Telehealth: Payer: Self-pay | Admitting: *Deleted

## 2017-10-21 ENCOUNTER — Other Ambulatory Visit: Payer: Self-pay

## 2017-10-21 ENCOUNTER — Inpatient Hospital Stay: Payer: Medicare HMO | Attending: Hematology and Oncology | Admitting: Hematology and Oncology

## 2017-10-21 VITALS — BP 156/75 | HR 49 | Temp 98.3°F | Resp 18 | Wt 177.1 lb

## 2017-10-21 DIAGNOSIS — Z862 Personal history of diseases of the blood and blood-forming organs and certain disorders involving the immune mechanism: Secondary | ICD-10-CM | POA: Diagnosis not present

## 2017-10-21 DIAGNOSIS — C182 Malignant neoplasm of ascending colon: Secondary | ICD-10-CM

## 2017-10-21 DIAGNOSIS — D500 Iron deficiency anemia secondary to blood loss (chronic): Secondary | ICD-10-CM | POA: Insufficient documentation

## 2017-10-21 DIAGNOSIS — R970 Elevated carcinoembryonic antigen [CEA]: Secondary | ICD-10-CM | POA: Diagnosis not present

## 2017-10-21 DIAGNOSIS — C184 Malignant neoplasm of transverse colon: Secondary | ICD-10-CM | POA: Insufficient documentation

## 2017-10-21 LAB — COMPREHENSIVE METABOLIC PANEL
ALT: 14 U/L (ref 0–44)
AST: 17 U/L (ref 15–41)
Albumin: 3.9 g/dL (ref 3.5–5.0)
Alkaline Phosphatase: 35 U/L — ABNORMAL LOW (ref 38–126)
Anion gap: 7 (ref 5–15)
BUN: 34 mg/dL — ABNORMAL HIGH (ref 8–23)
CO2: 22 mmol/L (ref 22–32)
Calcium: 8.1 mg/dL — ABNORMAL LOW (ref 8.9–10.3)
Chloride: 110 mmol/L (ref 98–111)
Creatinine, Ser: 1.19 mg/dL — ABNORMAL HIGH (ref 0.44–1.00)
GFR calc Af Amer: 43 mL/min — ABNORMAL LOW (ref >60)
GFR calc non Af Amer: 37 mL/min — ABNORMAL LOW (ref >60)
Glucose, Bld: 99 mg/dL (ref 70–99)
Potassium: 4.4 mmol/L (ref 3.5–5.1)
Sodium: 139 mmol/L (ref 135–145)
Total Bilirubin: 0.8 mg/dL (ref 0.3–1.2)
Total Protein: 6.9 g/dL (ref 6.5–8.1)

## 2017-10-21 LAB — CBC WITH DIFFERENTIAL/PLATELET
Basophils Absolute: 0 10*3/uL (ref 0–0.1)
Basophils Relative: 0 %
Eosinophils Absolute: 0.1 10*3/uL (ref 0–0.7)
Eosinophils Relative: 2 %
HCT: 41.9 % (ref 35.0–47.0)
Hemoglobin: 13.9 g/dL (ref 12.0–16.0)
Lymphocytes Relative: 13 %
Lymphs Abs: 0.9 10*3/uL — ABNORMAL LOW (ref 1.0–3.6)
MCH: 30.1 pg (ref 26.0–34.0)
MCHC: 33.2 g/dL (ref 32.0–36.0)
MCV: 90.7 fL (ref 80.0–100.0)
Monocytes Absolute: 0.6 10*3/uL (ref 0.2–0.9)
Monocytes Relative: 8 %
Neutro Abs: 5.2 10*3/uL (ref 1.4–6.5)
Neutrophils Relative %: 77 %
Platelets: 185 10*3/uL (ref 150–440)
RBC: 4.62 MIL/uL (ref 3.80–5.20)
RDW: 14.1 % (ref 11.5–14.5)
WBC: 6.9 10*3/uL (ref 3.6–11.0)

## 2017-10-21 LAB — FERRITIN: Ferritin: 37 ng/mL (ref 11–307)

## 2017-10-21 NOTE — Telephone Encounter (Signed)
Called patient to inform her that. her calcium is low.  MD recommends 1200 mg daily. Will recheck lab in one month.

## 2017-10-21 NOTE — Progress Notes (Signed)
[facility] day:  10/21/2017   Chief Complaint: Alexis Haas is a 84 y.o. female with stage III colon cancer and iron deficiency anemia who is seen for a 3 month assessment.  HPI:  The patient was last seen in the medical oncology clinic on 06/17/2017.  At that time, she felt "the same".  She denied any bowel symptoms or bleeding. Exam was stable.  Hematocrit was 40.8.  Ferritin was 33.  Calcium was 8.7.  PET scan on 06/26/2017 revealed right hemicolectomy, without residual active malignancy identified. There were no liver lesions observed.  There was activity in the longus capitis muscles, likely physiologic. Other imaging findings of potential clinical significance: moderate to large hiatal hernia, mild cardiomegaly, aortic atherosclerosis, coronary atherosclerosis, ventral hernias containing loops of small bowel and omental adipose tissue without complicating feature, spondylosis, and severe right glenohumeral arthropathy.  During the interim, she has done well.  She denies any abdominal symptoms.  Her biggest issue is her knees.  They hurt "if I am on them".  She would like to have knee surgery.  She is using a rolling walker.   Past Medical History:  Diagnosis Date  . Anemia   . Arthritis   . Cancer ([place]) 04/15/2016   T3, N1a adenocarcinoma of the hepatic flexure. No mismatch repair gene abnormality.   . Chronic kidney disease    LABS 3/18 COMPARED WITH 1 YEAR AGO  . Coronary artery disease   . Fall 2018  . GERD (gastroesophageal reflux disease)   . Heart disease   . Hypercholesteremia   . Hypertension   . Insomnia     Past Surgical History:  Procedure Laterality Date  . CARDIAC SURGERY  2005   bypass  . COLONOSCOPY WITH PROPOFOL N/A 04/15/2016   Procedure: COLONOSCOPY WITH PROPOFOL;  Surgeon: Lucilla Lame, MD;  Location: [facility] ENDOSCOPY;  Service: Endoscopy;  Laterality: N/A;  . CORONARY ARTERY BYPASS GRAFT  2005   x 2  .  ESOPHAGOGASTRODUODENOSCOPY (EGD) WITH PROPOFOL N/A 04/15/2016   Procedure: ESOPHAGOGASTRODUODENOSCOPY (EGD) WITH PROPOFOL;  Surgeon: Lucilla Lame, MD;  Location: [facility] ENDOSCOPY;  Service: Endoscopy;  Laterality: N/A;  . LAPAROSCOPIC RIGHT COLECTOMY Right 06/05/2016   Procedure: LAPAROSCOPIC RIGHT COLECTOMY;  Surgeon: Robert Bellow, MD;  Location: [facility] ORS;  Service: General;  Laterality: Right;  . toe removal    . TONSILLECTOMY     age 89  . UMBILICAL HERNIA REPAIR  06/05/2016   Procedure: HERNIA REPAIR UMBILICAL ADULT;  Surgeon: Robert Bellow, MD;  Location: [facility] ORS;  Service: General;;    History reviewed. No pertinent family history.  Social History:  reports that she has never smoked. She has never used smokeless tobacco. She reports that she does not drink alcohol or use drugs.  She states that she has worked her whole life (since the age of 53).  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  She lives in [place].  She lives alone.  Her husband died 43 years ago.  Her daughter is Theodora Blow (home: [phone]; work: [phone]).  The patient is alone today.  Allergies:  Allergies  Allergen Reactions  . Amoxicillin Rash    Has patient had a PCN reaction causing immediate rash, facial/tongue/throat swelling, SOB or lightheadedness with hypotension:Yes Has patient had a PCN reaction causing severe rash involving mucus membranes or skin necrosis:unsure Has patient had a PCN  reaction that required hospitalization:No Has patient had a PCN reaction occurring within the last 10 years:No If all of the above answers are "NO", then may proceed with Cephalosporin use.     Current Medications: Current Outpatient Medications  Medication Sig Dispense Refill  . aspirin EC 81 MG tablet Take 81 mg by mouth daily.    . ferrous sulfate 325 (65 FE) MG tablet Take 325 mg by mouth daily with breakfast.    . lovastatin (MEVACOR) 40  MG tablet Take 40 mg by mouth at bedtime.    . metoprolol tartrate (LOPRESSOR) 25 MG tablet Take 25 mg by mouth 2 (two) times daily.    . naproxen sodium (ANAPROX) 220 MG tablet Take 220 mg by mouth daily as needed (for pain).     Marland Kitchen omeprazole (PRILOSEC) 20 MG capsule Take 20 mg by mouth at bedtime.     . hydrochlorothiazide (HYDRODIURIL) 25 MG tablet Take 25 mg by mouth daily.     No current facility-administered medications for this visit.     Review of Systems:  GENERAL:  Feels "ok".  No fevers, sweats or weight loss.  Weight up 1 pound. PERFORMANCE STATUS (ECOG):  1 HEENT:  No visual changes, runny nose, sore throat, mouth sores or tenderness. Lungs: No shortness of breath or cough.  No hemoptysis. Cardiac:  No chest pain, palpitations, orthopnea, or PND. GI:  No nausea, vomiting, diarrhea, constipation, melena or hematochezia. GU:  No urgency, frequency, dysuria, or hematuria. Musculoskeletal:  No back pain.  Arthritis in knees (would like to have surgery).  Can't lift arms high (chronic).  No muscle tenderness. Extremities:  No pain or swelling. Skin:  No rashes or skin changes. Neuro:  No headache, numbness or weakness, balance or coordination issues. Endocrine:  No diabetes, thyroid issues, hot flashes or night sweats. Psych:  No mood changes, depression or anxiety. Pain:  No focal pain. Review of systems:  All other systems reviewed and found to be negative.   Physical Exam: Blood pressure (!) 156/75, pulse (!) 49, temperature 98.3 F (36.8 C), temperature source Tympanic, resp. rate 18, weight 177 lb 2.2 oz (80.3 kg). GENERAL:  Elderly woman sitting comfortably in the exam room in no acute distress.  She has a rolling walker at her side. MENTAL STATUS:  Alert and oriented to person, place and time. HEAD:  Curly gray hair.  Normocephalic, atraumatic, face symmetric, no Cushingoid features. EYES:  Glasses.  Blue eyes.  Pupils equal round and reactive to light and accomodation.   No conjunctivitis or scleral icterus. ENT:  Oropharynx clear without lesion.  Tongue normal. Dentures.  Mucous membranes moist.  RESPIRATORY:  Clear to auscultation without rales, wheezes or rhonchi. CARDIOVASCULAR:  Regular rate and rhythm without murmur, rub or gallop. ABDOMEN:  Soft, non-tender, with active bowel sounds, and no hepatosplenomegaly.  No masses. SKIN:  No rashes, ulcers or lesions. EXTREMITIES: Arthritic changes in hands.  Trace lower extremity edema.  No skin discoloration or tenderness.  No palpable cords. LYMPH NODES: No palpable cervical, supraclavicular, axillary or inguinal adenopathy  NEUROLOGICAL: Unremarkable. PSYCH:  Appropriate.    Appointment on 10/21/2017  Component Date Value Ref Range Status  . Ferritin 10/21/2017 37  11 - 307 ng/mL Final   Performed at [facility], [address] [place], [place] [address]  . CEA 10/21/2017 5.5* 0.0 - 4.7 ng/mL Final   Comment: (NOTE)  Nonsmokers          <3.9                             Smokers             <5.6 [employer] Electrochemiluminescence Immunoassay (ECLIA) Values obtained with different assay methods or kits cannot be used interchangeably.  Results cannot be interpreted as absolute evidence of the presence or absence of malignant disease. Performed At: [facility] [place], [place] [id number] Rush Farmer MD [id number]   . Sodium 10/21/2017 139  135 - 145 mmol/L Final  . Potassium 10/21/2017 4.4  3.5 - 5.1 mmol/L Final  . Chloride 10/21/2017 110  98 - 111 mmol/L Final  . CO2 10/21/2017 22  22 - 32 mmol/L Final  . Glucose, Bld 10/21/2017 99  70 - 99 mg/dL Final  . BUN 10/21/2017 34* 8 - 23 mg/dL Final  . Creatinine, Ser 10/21/2017 1.19* 0.44 - 1.00 mg/dL Final  . Calcium 10/21/2017 8.1* 8.9 - 10.3 mg/dL Final  . Total Protein 10/21/2017 6.9  6.5 - 8.1 g/dL Final  . Albumin 10/21/2017 3.9  3.5 - 5.0 g/dL Final  . AST 10/21/2017  17  15 - 41 U/L Final  . ALT 10/21/2017 14  0 - 44 U/L Final  . Alkaline Phosphatase 10/21/2017 35* 38 - 126 U/L Final  . Total Bilirubin 10/21/2017 0.8  0.3 - 1.2 mg/dL Final  . GFR calc non Af Amer 10/21/2017 37* >60 mL/min Final  . GFR calc Af Amer 10/21/2017 43* >60 mL/min Final   Comment: (NOTE) The eGFR has been calculated using the CKD EPI equation. This calculation has not been validated in all clinical situations. eGFR's persistently <60 mL/min signify possible Chronic Kidney Disease.   Georgiann Hahn gap 10/21/2017 7  5 - 15 Final   Performed at [facility], [address] [place], [place] [address]  . WBC 10/21/2017 6.9  3.6 - 11.0 K/uL Final  . RBC 10/21/2017 4.62  3.80 - 5.20 MIL/uL Final  . Hemoglobin 10/21/2017 13.9  12.0 - 16.0 g/dL Final  . HCT 10/21/2017 41.9  35.0 - 47.0 % Final  . MCV 10/21/2017 90.7  80.0 - 100.0 fL Final  . MCH 10/21/2017 30.1  26.0 - 34.0 pg Final  . MCHC 10/21/2017 33.2  32.0 - 36.0 g/dL Final  . RDW 10/21/2017 14.1  11.5 - 14.5 % Final  . Platelets 10/21/2017 185  150 - 440 K/uL Final  . Neutrophils Relative % 10/21/2017 77  % Final  . Neutro Abs 10/21/2017 5.2  1.4 - 6.5 K/uL Final  . Lymphocytes Relative 10/21/2017 13  % Final  . Lymphs Abs 10/21/2017 0.9* 1.0 - 3.6 K/uL Final  . Monocytes Relative 10/21/2017 8  % Final  . Monocytes Absolute 10/21/2017 0.6  0.2 - 0.9 K/uL Final  . Eosinophils Relative 10/21/2017 2  % Final  . Eosinophils Absolute 10/21/2017 0.1  0 - 0.7 K/uL Final  . Basophils Relative 10/21/2017 0  % Final  . Basophils Absolute 10/21/2017 0.0  0 - 0.1 K/uL Final   Performed at [facility], [address] [place], [place] [address]    Assessment:  Alexis Haas is a 84 y.o. 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 Pathology revealed a 3.7 cm moderately differentiated adenocarcinoma.  Tumor invaded the muscularis propria into the pericolorectal tissue. One of 28 lymph nodes were positive. Margins were negative. Pathologic stage was T3N1a (stage III). MSI/MMR was intact.  PET scan on 05/19/2016 revealed markedly hypermetabolic lesion in the distal ascending colon extending into the proximal hepatic flexure, consistent with the known primary malignancy.  There was a tiny focus of FDG uptake in the medial right liver, just minimally above the background expected mottled hepatic uptake. CT imaging suggests there may be a tiny 9 mm hypoattenuating focus at this location. While not definite, imaging features to raise concern for the possibility of metastatic disease in the liver.    PET scan on 06/26/2017 revealed no evidence of metastatic disease.  CEA has been followed:  6.4 on 05/07/2016, 4.8 on 06/25/2016, 5.4 on 09/10/2016, 5.6 on 12/17/2016, 5.3 on 03/18/2017, 4.9 on 06/17/2017, and 5.5 on 10/21/2017.  She presented with chronic iron deficiency anemia secondary to blood loss.  Invasive procedures (EGD or colonoscopy) were initially deferred secondary to her age.  Guaiac cards were negative (12/2014 and 10/2015) then positive (01/2016).  Diet is modest.  EGD on 04/15/2016 revealed a medium size hiatal hernia, nonbleeding gastric ulcer with no stigmata of bleeding.  Oral iron is ineffective.  She received 2 units PRBCs on 10/11/2015 for a hematocrit of 21.1 and hemoglobin 6.4.  She has received Feraheme on several occasions:  01/11/2015, 02/01/2015, 03/15/2015, 03/22/2015, 10/16/2015, 01/09/2016, 03/05/2016, and on 05/09/2016.    Ferritin has been monitored: 5 on 01/02/2015, 36 on 01/31/2105, 26 on 03/08/2015, 184 on 05/02/2015, 7 on 10/10/2015, 91 on 11/07/2015, 7 on 01/02/2016, 14 on 03/03/2016, 33 on 04/07/2016,12 on 05/07/2016, 21 on 06/25/2016, 14 on 07/23/2016, 14 on 09/10/2016, 24 on 12/17/2016, 13 on 03/18/2017, 33 on 06/17/2017, and 37 on 10/21/2017.  She has  chronic mild renal insufficiency.  Creatinine is 1.19 today.  She has chronic lower extremity edema (left > right).  Left lower extremity duplex on 07/23/2016 revealed no evidence of DVT.  Symptomatically, she notes chronic knee pain secondary to arthritis.  Exam is stable.  Hematocrit is 41.9.  Ferritin is 37.  Calcium is 8.1.  Plan: 1.  Labs today:  CBC with diff, CMP, CEA, ferritin. 2.  Stage III adenocarcinoma of the ascending colon:  Clinically doing well.  CEA chronically mildly elevated with negative PET scan.  Continue close surveillance.  Anticipate yearly imaging unless concerning symptoms are increase in CEA from baseline. 3.  Iron deficiency:  Hemoglobin and MCV are normal.  Ferritin is 37.  Ferritin goal is 100.  Continue oral iron. 4.  Hypocalcemia:  Continue oral calcium. 5.  RTC in 4 months for MD assessment and labs (CBC with diff, CMP, CEA, ferritin).   Lequita Asal, MD  10/21/2017, 4:35 PM

## 2017-10-21 NOTE — Progress Notes (Signed)
Here for follow up. " I feel fairly well " per pt. Alexis Haas,ated and bright affect. Here w son.

## 2017-10-22 LAB — CEA: CEA: 5.5 ng/mL — ABNORMAL HIGH (ref 0.0–4.7)

## 2017-11-20 ENCOUNTER — Inpatient Hospital Stay: Payer: Medicare HMO | Attending: Hematology and Oncology

## 2017-11-20 DIAGNOSIS — C184 Malignant neoplasm of transverse colon: Secondary | ICD-10-CM | POA: Diagnosis not present

## 2017-11-20 DIAGNOSIS — D500 Iron deficiency anemia secondary to blood loss (chronic): Secondary | ICD-10-CM

## 2017-11-20 LAB — COMPREHENSIVE METABOLIC PANEL
ALT: 16 U/L (ref 0–44)
AST: 19 U/L (ref 15–41)
Albumin: 4 g/dL (ref 3.5–5.0)
Alkaline Phosphatase: 35 U/L — ABNORMAL LOW (ref 38–126)
Anion gap: 11 (ref 5–15)
BUN: 26 mg/dL — ABNORMAL HIGH (ref 8–23)
CO2: 26 mmol/L (ref 22–32)
Calcium: 9 mg/dL (ref 8.9–10.3)
Chloride: 102 mmol/L (ref 98–111)
Creatinine, Ser: 1.19 mg/dL — ABNORMAL HIGH (ref 0.44–1.00)
GFR calc Af Amer: 43 mL/min — ABNORMAL LOW (ref >60)
GFR calc non Af Amer: 37 mL/min — ABNORMAL LOW (ref >60)
Glucose, Bld: 111 mg/dL — ABNORMAL HIGH (ref 70–99)
Potassium: 4 mmol/L (ref 3.5–5.1)
Sodium: 139 mmol/L (ref 135–145)
Total Bilirubin: 0.7 mg/dL (ref 0.3–1.2)
Total Protein: 7.1 g/dL (ref 6.5–8.1)

## 2017-12-01 ENCOUNTER — Other Ambulatory Visit: Payer: Self-pay | Admitting: Family Medicine

## 2017-12-01 ENCOUNTER — Ambulatory Visit
Admission: RE | Admit: 2017-12-01 | Discharge: 2017-12-01 | Disposition: A | Discharge status: Home / Self Care | Payer: Medicare HMO | Source: Ambulatory Visit | Attending: Family Medicine | Admitting: Family Medicine

## 2017-12-01 DIAGNOSIS — R102 Pelvic and perineal pain: Secondary | ICD-10-CM

## 2017-12-01 DIAGNOSIS — R1032 Left lower quadrant pain: Secondary | ICD-10-CM | POA: Insufficient documentation

## 2018-02-17 ENCOUNTER — Telehealth: Payer: Self-pay

## 2018-02-17 ENCOUNTER — Inpatient Hospital Stay: Payer: Medicare HMO | Attending: Hematology and Oncology | Admitting: Hematology and Oncology

## 2018-02-17 ENCOUNTER — Encounter: Payer: Self-pay | Admitting: Hematology and Oncology

## 2018-02-17 ENCOUNTER — Inpatient Hospital Stay: Payer: Medicare HMO

## 2018-02-17 VITALS — BP 186/82 | HR 44 | Temp 96.9°F | Resp 18 | Wt 178.1 lb

## 2018-02-17 DIAGNOSIS — C182 Malignant neoplasm of ascending colon: Secondary | ICD-10-CM

## 2018-02-17 DIAGNOSIS — N189 Chronic kidney disease, unspecified: Secondary | ICD-10-CM | POA: Diagnosis not present

## 2018-02-17 DIAGNOSIS — Z7982 Long term (current) use of aspirin: Secondary | ICD-10-CM | POA: Insufficient documentation

## 2018-02-17 DIAGNOSIS — Z951 Presence of aortocoronary bypass graft: Secondary | ICD-10-CM | POA: Diagnosis not present

## 2018-02-17 DIAGNOSIS — I129 Hypertensive chronic kidney disease with stage 1 through stage 4 chronic kidney disease, or unspecified chronic kidney disease: Secondary | ICD-10-CM | POA: Diagnosis not present

## 2018-02-17 DIAGNOSIS — R001 Bradycardia, unspecified: Secondary | ICD-10-CM | POA: Insufficient documentation

## 2018-02-17 DIAGNOSIS — C184 Malignant neoplasm of transverse colon: Secondary | ICD-10-CM | POA: Insufficient documentation

## 2018-02-17 DIAGNOSIS — Z79899 Other long term (current) drug therapy: Secondary | ICD-10-CM | POA: Diagnosis not present

## 2018-02-17 DIAGNOSIS — R600 Localized edema: Secondary | ICD-10-CM | POA: Diagnosis not present

## 2018-02-17 DIAGNOSIS — D500 Iron deficiency anemia secondary to blood loss (chronic): Secondary | ICD-10-CM | POA: Insufficient documentation

## 2018-02-17 DIAGNOSIS — K219 Gastro-esophageal reflux disease without esophagitis: Secondary | ICD-10-CM | POA: Diagnosis not present

## 2018-02-17 DIAGNOSIS — E7800 Pure hypercholesterolemia, unspecified: Secondary | ICD-10-CM | POA: Diagnosis not present

## 2018-02-17 LAB — CBC WITH DIFFERENTIAL/PLATELET
Abs Immature Granulocytes: 0.03 10*3/uL (ref 0.00–0.07)
Basophils Absolute: 0 10*3/uL (ref 0.0–0.1)
Basophils Relative: 0 %
Eosinophils Absolute: 0.2 10*3/uL (ref 0.0–0.5)
Eosinophils Relative: 3 %
HCT: 40.7 % (ref 36.0–46.0)
Hemoglobin: 13.5 g/dL (ref 12.0–15.0)
Immature Granulocytes: 1 %
Lymphocytes Relative: 18 %
Lymphs Abs: 1 10*3/uL (ref 0.7–4.0)
MCH: 30.5 pg (ref 26.0–34.0)
MCHC: 33.2 g/dL (ref 30.0–36.0)
MCV: 91.9 fL (ref 80.0–100.0)
Monocytes Absolute: 0.6 10*3/uL (ref 0.1–1.0)
Monocytes Relative: 10 %
Neutro Abs: 3.7 10*3/uL (ref 1.7–7.7)
Neutrophils Relative %: 68 %
Platelets: 207 10*3/uL (ref 150–400)
RBC: 4.43 MIL/uL (ref 3.87–5.11)
RDW: 13.1 % (ref 11.5–15.5)
WBC: 5.4 10*3/uL (ref 4.0–10.5)
nRBC: 0 % (ref 0.0–0.2)

## 2018-02-17 LAB — COMPREHENSIVE METABOLIC PANEL
ALT: 21 U/L (ref 0–44)
AST: 27 U/L (ref 15–41)
Albumin: 4.2 g/dL (ref 3.5–5.0)
Alkaline Phosphatase: 86 U/L (ref 38–126)
Anion gap: 10 (ref 5–15)
BUN: 37 mg/dL — ABNORMAL HIGH (ref 8–23)
CO2: 25 mmol/L (ref 22–32)
Calcium: 8.5 mg/dL — ABNORMAL LOW (ref 8.9–10.3)
Chloride: 101 mmol/L (ref 98–111)
Creatinine, Ser: 1.39 mg/dL — ABNORMAL HIGH (ref 0.44–1.00)
GFR calc Af Amer: 36 mL/min — ABNORMAL LOW (ref >60)
GFR calc non Af Amer: 31 mL/min — ABNORMAL LOW (ref >60)
Glucose, Bld: 103 mg/dL — ABNORMAL HIGH (ref 70–99)
Potassium: 3.8 mmol/L (ref 3.5–5.1)
Sodium: 136 mmol/L (ref 135–145)
Total Bilirubin: 1.1 mg/dL (ref 0.3–1.2)
Total Protein: 7.1 g/dL (ref 6.5–8.1)

## 2018-02-17 LAB — FERRITIN: Ferritin: 100 ng/mL (ref 11–307)

## 2018-02-17 NOTE — Progress Notes (Signed)
[facility] day:  02/17/2018   Chief Complaint: Alexis Haas is a 85 y.o. female with stage III colon cancer and iron deficiency anemia who is seen for 4 month assessment.  HPI:  The patient was last seen in the medical oncology clinic on 10/21/2017.  At that time, she noted chronic knee pain secondary to arthritis.  Exam was stable.  Hematocrit was 41.9.  Ferritin was 37.  Calcium was 8.1.  During the interim, patient is doing well overall. She complains of continued pain in her knees. She states, "they only hurt when I walk on them". Patient denies and nausea, vomiting, or changes to her bowel habits. Patient denies bleeding; no hematochezia, melena, or gross hematuria.   Patient is able to abduct her arms to a greater degree without pain. She presents to the clinic HYPERtensive at 186/82 and BRADYcardic at a rate of 44. Of note, patient is on beta blocker therapy.   Patient advises that she maintains an adequate appetite. She is eating well. She states, "all I want to do is eat". Weight today is 178 lb 1.6 oz (80.8 kg), which compared to her last visit to the clinic, represents a 1 pound increase.   Patient denies pain in the clinic today.   Past Medical History:  Diagnosis Date  . Anemia   . Arthritis   . Cancer ([place]) 04/15/2016   T3, N1a adenocarcinoma of the hepatic flexure. No mismatch repair gene abnormality.   . Chronic kidney disease    LABS 3/18 COMPARED WITH 1 YEAR AGO  . Coronary artery disease   . Fall 2018  . GERD (gastroesophageal reflux disease)   . Heart disease   . Hypercholesteremia   . Hypertension   . Insomnia     Past Surgical History:  Procedure Laterality Date  . CARDIAC SURGERY  2005   bypass  . COLONOSCOPY WITH PROPOFOL N/A 04/15/2016   Procedure: COLONOSCOPY WITH PROPOFOL;  Surgeon: Lucilla Lame, MD;  Location: [facility] ENDOSCOPY;  Service: Endoscopy;  Laterality: N/A;  . CORONARY ARTERY BYPASS GRAFT  2005   x  2  . ESOPHAGOGASTRODUODENOSCOPY (EGD) WITH PROPOFOL N/A 04/15/2016   Procedure: ESOPHAGOGASTRODUODENOSCOPY (EGD) WITH PROPOFOL;  Surgeon: Lucilla Lame, MD;  Location: [facility] ENDOSCOPY;  Service: Endoscopy;  Laterality: N/A;  . LAPAROSCOPIC RIGHT COLECTOMY Right 06/05/2016   Procedure: LAPAROSCOPIC RIGHT COLECTOMY;  Surgeon: Robert Bellow, MD;  Location: [facility] ORS;  Service: General;  Laterality: Right;  . toe removal    . TONSILLECTOMY     age 73  . UMBILICAL HERNIA REPAIR  06/05/2016   Procedure: HERNIA REPAIR UMBILICAL ADULT;  Surgeon: Robert Bellow, MD;  Location: [facility] ORS;  Service: General;;    History reviewed. No pertinent family history.  Social History:  reports that she has never smoked. She has never used smokeless tobacco. She reports that she does not drink alcohol or use drugs.  She states that she has worked her whole life (since the age of 57).  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  She lives in [place] at [place].  She lives alone.  Her husband died 15 years ago.  Her daughter is Theodora Blow (home: [phone]; work: [phone]).  The patient is alone today.  Allergies:  Allergies  Allergen Reactions  . Amoxicillin Rash    Has patient had a PCN reaction causing immediate rash, facial/tongue/throat swelling, SOB  or lightheadedness with hypotension:Yes Has patient had a PCN reaction causing severe rash involving mucus membranes or skin necrosis:unsure Has patient had a PCN reaction that required hospitalization:No Has patient had a PCN reaction occurring within the last 10 years:No If all of the above answers are "NO", then may proceed with Cephalosporin use.     Current Medications: Current Outpatient Medications  Medication Sig Dispense Refill  . aspirin EC 81 MG tablet Take 81 mg by mouth daily.    . ferrous sulfate 325 (65 FE) MG tablet Take 325 mg by mouth daily with breakfast.    . hydrochlorothiazide  (HYDRODIURIL) 25 MG tablet Take 25 mg by mouth daily.    Marland Kitchen lovastatin (MEVACOR) 40 MG tablet Take 40 mg by mouth at bedtime.    . metoprolol tartrate (LOPRESSOR) 25 MG tablet Take 25 mg by mouth 2 (two) times daily.    Marland Kitchen omeprazole (PRILOSEC) 20 MG capsule Take 20 mg by mouth at bedtime.     . naproxen sodium (ANAPROX) 220 MG tablet Take 220 mg by mouth daily as needed (for pain).      No current facility-administered medications for this visit.     Review of Systems:  GENERAL:  Feels "alright".  No fevers, sweats or weight loss.  Weight up 1 pound. PERFORMANCE STATUS (ECOG):  1 HEENT:  No visual changes, runny nose, sore throat, mouth sores or tenderness. Lungs: No shortness of breath or cough.  No hemoptysis. Cardiac:  No chest pain, palpitations, orthopnea, or PND. GI:  No nausea, vomiting, diarrhea, constipation, melena or hematochezia. GU:  No urgency, frequency, dysuria, or hematuria. Musculoskeletal:  No back pain.  Pain in knees ("hurts to walk").  Able to lift arma a little better without pain.  No muscle tenderness. Extremities:  No pain or swelling. Skin:  No rashes or skin changes. Neuro:  No headache, numbness or weakness, balance or coordination issues. Endocrine:  No diabetes, thyroid issues, hot flashes or night sweats. Psych:  No mood changes, depression or anxiety. Pain:  No focal pain. Review of systems:  All other systems reviewed and found to be negative.   Physical Exam: Blood pressure (!) 186/82, pulse (!) 44, temperature (!) 96.9 F (36.1 C), temperature source Tympanic, resp. rate 18, weight 178 lb 1.6 oz (80.8 kg), SpO2 100 %. GENERAL:  Elderly woman sitting comfortably in the exam room in no acute distress.  She has a rolling walker at her side. MENTAL STATUS:  Alert and oriented to person, place and time. HEAD:  Pearline Cables hair.  Normocephalic, atraumatic, face symmetric, no Cushingoid features. EYES:  Glasses.  Blue eyes.  Pupils equal round and reactive to  light and accomodation.  No conjunctivitis or scleral icterus. ENT:  Oropharynx clear without lesion.  Tongue normal. Mucous membranes moist.  RESPIRATORY:  Clear to auscultation without rales, wheezes or rhonchi. CARDIOVASCULAR:  Regular rate and rhythm without murmur, rub or gallop. ABDOMEN:  Soft, non-tender, with active bowel sounds, and no hepatosplenomegaly.  No masses. SKIN:  No rashes, ulcers or lesions. EXTREMITIES: Arthritic changes in hands.  No edema, no skin discoloration or tenderness.  No palpable cords. LYMPH NODES: No palpable cervical, supraclavicular, axillary or inguinal adenopathy  NEUROLOGICAL: Unremarkable. PSYCH:  Appropriate.    Appointment on 02/17/2018  Component Date Value Ref Range Status  . Sodium 02/17/2018 136  135 - 145 mmol/L Final  . Potassium 02/17/2018 3.8  3.5 - 5.1 mmol/L Final  . Chloride 02/17/2018 101  98 - 111  mmol/L Final  . CO2 02/17/2018 25  22 - 32 mmol/L Final  . Glucose, Bld 02/17/2018 103* 70 - 99 mg/dL Final  . BUN 02/17/2018 37* 8 - 23 mg/dL Final  . Creatinine, Ser 02/17/2018 1.39* 0.44 - 1.00 mg/dL Final  . Calcium 02/17/2018 8.5* 8.9 - 10.3 mg/dL Final  . Total Protein 02/17/2018 7.1  6.5 - 8.1 g/dL Final  . Albumin 02/17/2018 4.2  3.5 - 5.0 g/dL Final  . AST 02/17/2018 27  15 - 41 U/L Final  . ALT 02/17/2018 21  0 - 44 U/L Final  . Alkaline Phosphatase 02/17/2018 86  38 - 126 U/L Final  . Total Bilirubin 02/17/2018 1.1  0.3 - 1.2 mg/dL Final  . GFR calc non Af Amer 02/17/2018 31* >60 mL/min Final  . GFR calc Af Amer 02/17/2018 36* >60 mL/min Final  . Anion gap 02/17/2018 10  5 - 15 Final   Performed at [facility], [address] [place], [place] [address]  . WBC 02/17/2018 5.4  4.0 - 10.5 K/uL Final  . RBC 02/17/2018 4.43  3.87 - 5.11 MIL/uL Final  . Hemoglobin 02/17/2018 13.5  12.0 - 15.0 g/dL Final  . HCT 02/17/2018 40.7  36.0 - 46.0 % Final  . MCV 02/17/2018 91.9  80.0 - 100.0 fL Final  . MCH 02/17/2018  30.5  26.0 - 34.0 pg Final  . MCHC 02/17/2018 33.2  30.0 - 36.0 g/dL Final  . RDW 02/17/2018 13.1  11.5 - 15.5 % Final  . Platelets 02/17/2018 207  150 - 400 K/uL Final  . nRBC 02/17/2018 0.0  0.0 - 0.2 % Final  . Neutrophils Relative % 02/17/2018 68  % Final  . Neutro Abs 02/17/2018 3.7  1.7 - 7.7 K/uL Final  . Lymphocytes Relative 02/17/2018 18  % Final  . Lymphs Abs 02/17/2018 1.0  0.7 - 4.0 K/uL Final  . Monocytes Relative 02/17/2018 10  % Final  . Monocytes Absolute 02/17/2018 0.6  0.1 - 1.0 K/uL Final  . Eosinophils Relative 02/17/2018 3  % Final  . Eosinophils Absolute 02/17/2018 0.2  0.0 - 0.5 K/uL Final  . Basophils Relative 02/17/2018 0  % Final  . Basophils Absolute 02/17/2018 0.0  0.0 - 0.1 K/uL Final  . Immature Granulocytes 02/17/2018 1  % Final  . Abs Immature Granulocytes 02/17/2018 0.03  0.00 - 0.07 K/uL Final   Performed at [facility], [address], [place], [place] [address]    Assessment:  ZANOVIA ROTZ is a 85 y.o. 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 Pathology revealed a 3.7 cm moderately differentiated adenocarcinoma. Tumor invaded the muscularis propria into the pericolorectal tissue. One of 28 lymph nodes were positive. Margins were negative. Pathologic stage was T3N1a (stage III). MSI/MMR was intact.  PET scan on 05/19/2016 revealed markedly hypermetabolic lesion in the distal ascending colon extending into the proximal hepatic flexure, consistent with the known primary malignancy.  There was a tiny focus of FDG uptake in the medial right liver, just minimally above the background expected mottled hepatic uptake. CT imaging suggests there may be a tiny 9 mm hypoattenuating focus at this location. While not definite, imaging features to raise concern for the possibility of metastatic disease in the liver.    PET scan on  06/26/2017 revealed no evidence of metastatic disease.  CEA has been followed:  6.4 on 05/07/2016, 4.8 on  06/25/2016, 5.4 on 09/10/2016, 5.6 on 12/17/2016, 5.3 on 03/18/2017, 4.9 on 06/17/2017, 5.5 on 10/21/2017, and 5.3 on 02/17/2018.  She presented with chronic iron deficiency anemia secondary to blood loss.  Invasive procedures (EGD or colonoscopy) were initially deferred secondary to her age.  Guaiac cards were negative (12/2014 and 10/2015) then positive (01/2016).  Diet is modest.  EGD on 04/15/2016 revealed a medium size hiatal hernia, nonbleeding gastric ulcer with no stigmata of bleeding.  Oral iron is ineffective.  She received 2 units PRBCs on 10/11/2015 for a hematocrit of 21.1 and hemoglobin 6.4.  She has received Feraheme on several occasions:  01/11/2015, 02/01/2015, 03/15/2015, 03/22/2015, 10/16/2015, 01/09/2016, 03/05/2016, and on 05/09/2016.    Ferritin has been monitored: 5 on 01/02/2015, 36 on 01/31/2105, 26 on 03/08/2015, 184 on 05/02/2015, 7 on 10/10/2015, 91 on 11/07/2015, 7 on 01/02/2016, 14 on 03/03/2016, 33 on 04/07/2016,12 on 05/07/2016, 21 on 06/25/2016, 14 on 07/23/2016, 14 on 09/10/2016, 24 on 12/17/2016, 13 on 03/18/2017, 33 on 06/17/2017, 37 on 10/21/2017, and 100 on 02/17/2018.  She has chronic mild renal insufficiency.  Creatinine is 1.19 today.  She has chronic lower extremity edema (left > right).  Left lower extremity duplex on 07/23/2016 revealed no evidence of DVT.  Symptomatically, she denies any new complaints.  She has chronic issues with her knees and limited abduction in her shoulders.  She denies any abdominal symptoms.  Exam is stable.  Hemoglobin is 13.5.  Ferritin is 100.  Calcium is 8.5.  Plan: 1.  Labs today:  CBC with diff, CMP, CEA, ferritin. 2.  Stage III adenocarcinoma of the ascending colon  Patient continues to do well.  CEA remains chronically mildly elevated with negative PET scan in 2019.  Continue surveillance.  Discuss plans for  imaging if CEA increases > 10 % from baseline. 3.  Iron deficiency  Hemoglobin and MCV are normal.  Ferritin is pending. Ferritin goal is 100.  Hemoglobin and MCV are normal.  Continue oral iron. 4.  Hypocalcemia  Encourage oral calcium. 5.  Hypertension and bradycardia  Patient asymptomatic on metoprolol  Contact Dr. Clemmie Krill' office- patient has appointment on 02/18/2018 at 10:45. 6.  RTC in 4 months for MD assessment and labs (CBC with diff, CMP, CEA, ferritin).   Honor Loh, NP  02/17/2018, 2:12 PM   I saw and evaluated the patient, participating in the key portions of the service and reviewing pertinent diagnostic studies and records.  I reviewed the nurse practitioner's note and agree with the findings and the plan.  The assessment and plan were discussed with the patient.  Several questions were asked by the patient and answered.   Nolon Stalls, MD 02/17/2018,2:12 PM

## 2018-02-17 NOTE — Telephone Encounter (Signed)
Spoke with PCP office staff due to the patient b/p has been running high and pulse running low while in the office for the last 3 visit.Information faxed over to her PCP office Dr Clemmie Krill 1 [phone]. The patient was schedule to see MD 02/18/18 @ 10:45. The patient was agreeable and understanding to go to the appointment on 02/18/18.

## 2018-02-17 NOTE — Progress Notes (Signed)
Pt here for f/u. Denies any complaints at this time. Checked BP and HR manually d/t abnormal readings.

## 2018-02-18 LAB — CEA: CEA: 5.3 ng/mL — ABNORMAL HIGH (ref 0.0–4.7)

## 2018-04-12 ENCOUNTER — Telehealth: Payer: Self-pay

## 2018-04-12 NOTE — Telephone Encounter (Signed)
Informed patient of Ferritin and Hgb results and to stop taking Ferrous Sulfate at this time per Dr. Mike Gip. Patient verbalizes understanding and denies any concerns at this time.

## 2018-06-16 ENCOUNTER — Other Ambulatory Visit: Payer: Medicare HMO

## 2018-06-16 ENCOUNTER — Ambulatory Visit: Payer: Medicare HMO | Admitting: Hematology and Oncology

## 2018-07-14 ENCOUNTER — Ambulatory Visit: Payer: Medicare HMO | Admitting: Hematology and Oncology

## 2018-07-14 ENCOUNTER — Other Ambulatory Visit: Payer: Medicare HMO

## 2018-08-17 ENCOUNTER — Other Ambulatory Visit: Payer: Self-pay

## 2018-08-17 NOTE — Progress Notes (Signed)
[facility]  [address] [place], [place] [address] Phone: [phone]  Fax: [phone]   Clinic Day:  08/18/2018  Referring physician: Lynnell Jude, MD  Chief Complaint: Alexis Haas is a 85 y.o. female with stage III colon cancer and iron deficiency anemia who is seen for 4 month assessment.  HPI: The patient was last seen in the medical oncology clinic on 02/17/2018. At that time, she denied any new complaints.  She had chronic issues with her knees and limited abduction in her shoulders.  She denied any abdominal symptoms.  Exam was stable.  Hemoglobin 13.5.  Ferritin 100.  Calcium 8.5.  CEA was 5.3 (stable).  Oral iron was discontinued on 04/12/2018.   During the interim, she reports "I've been like usual." She reports she has been eating more since she is at home all day. She reports continued knee pain, worse when walking, and is considering using Voltaren gel. She has difficulty sleeping some nights, and wakes up at 3-4am unable to go back to sleep. She denies any nausea, vomiting, blood in her stools, or black stools. She notes a hernia, that has been getting bigger.   The patient is undecided if she wants to undergo her routine annual CT scan. He daughter thinks they should hold off a scan unless there is concerning lab work or clinical concern.    Past Medical History:  Diagnosis Date  . Anemia   . Arthritis   . Cancer ([place]) 04/15/2016   T3, N1a adenocarcinoma of the hepatic flexure. No mismatch repair gene abnormality.   . Chronic kidney disease    LABS 3/18 COMPARED WITH 1 YEAR AGO  . Coronary artery disease   . Fall 2018  . GERD (gastroesophageal reflux disease)   . Heart disease   . Hypercholesteremia   . Hypertension   . Insomnia     Past Surgical History:  Procedure Laterality Date  . CARDIAC SURGERY  2005   bypass  . COLONOSCOPY WITH PROPOFOL N/A 04/15/2016   Procedure: COLONOSCOPY WITH PROPOFOL;  Surgeon: Lucilla Lame, MD;  Location: [facility] ENDOSCOPY;  Service: Endoscopy;  Laterality: N/A;  . CORONARY ARTERY BYPASS GRAFT  2005   x 2  . ESOPHAGOGASTRODUODENOSCOPY (EGD) WITH PROPOFOL N/A 04/15/2016   Procedure: ESOPHAGOGASTRODUODENOSCOPY (EGD) WITH PROPOFOL;  Surgeon: Lucilla Lame, MD;  Location: [facility] ENDOSCOPY;  Service: Endoscopy;  Laterality: N/A;  . LAPAROSCOPIC RIGHT COLECTOMY Right 06/05/2016   Procedure: LAPAROSCOPIC RIGHT COLECTOMY;  Surgeon: Robert Bellow, MD;  Location: [facility] ORS;  Service: General;  Laterality: Right;  . toe removal    . TONSILLECTOMY     age 50  . UMBILICAL HERNIA REPAIR  06/05/2016   Procedure: HERNIA REPAIR UMBILICAL ADULT;  Surgeon: Robert Bellow, MD;  Location: [facility] ORS;  Service: General;;    History reviewed. No pertinent family history.  Social History:  reports that she has never smoked. She has never used smokeless tobacco. She reports that she does not drink alcohol or use drugs. She states that she has worked her whole life (since the age of 65).  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  She [place]. She lives alone.  Her husband died 25 years ago.  Her daughter is Theodora Blow (home: [phone]; work: [phone]).  The patient is alone today.  Allergies:  Allergies  Allergen Reactions  . Amoxicillin Rash  Has patient had a PCN reaction causing immediate rash, facial/tongue/throat swelling, SOB or lightheadedness with hypotension:Yes Has patient had a PCN reaction causing severe rash involving mucus membranes or skin necrosis:unsure Has patient had a PCN reaction that required hospitalization:No Has patient had a PCN reaction occurring within the last 10 years:No If all of the above answers are "NO", then may proceed with Cephalosporin use.     Current Medications: Current Outpatient Medications  Medication Sig Dispense Refill  . aspirin EC 81 MG tablet Take 81 mg by mouth  daily.    . ferrous sulfate 325 (65 FE) MG tablet Take 325 mg by mouth daily with breakfast.    . hydrochlorothiazide (HYDRODIURIL) 25 MG tablet Take 25 mg by mouth daily.    Marland Kitchen lovastatin (MEVACOR) 40 MG tablet Take 40 mg by mouth at bedtime.    . metoprolol tartrate (LOPRESSOR) 25 MG tablet Take 25 mg by mouth 2 (two) times daily.    Marland Kitchen omeprazole (PRILOSEC) 20 MG capsule Take 20 mg by mouth at bedtime.     . naproxen sodium (ANAPROX) 220 MG tablet Take 220 mg by mouth daily as needed (for pain).      No current facility-administered medications for this visit.     Review of Systems  Constitutional: Negative.  Negative for chills, diaphoresis, fever, malaise/fatigue and weight loss (up 1 lb).       Feels "like usual."    HENT: Positive for ear pain, hearing loss and nosebleeds. Negative for congestion, sinus pain and sore throat.   Eyes: Negative.  Negative for blurred vision, double vision and photophobia.  Respiratory: Negative.  Negative for cough, hemoptysis and wheezing.   Cardiovascular: Negative.  Negative for chest pain, palpitations, orthopnea, leg swelling and PND.  Gastrointestinal: Negative.  Negative for abdominal pain, blood in stool, constipation, diarrhea, melena, nausea and vomiting.  Genitourinary: Negative.  Negative for dysuria, frequency, hematuria and urgency.  Musculoskeletal: Positive for joint pain (knees). Negative for back pain and myalgias.  Skin: Negative.  Negative for rash.  Neurological: Negative.  Negative for dizziness, tingling, sensory change, speech change, focal weakness, weakness and headaches.  Endo/Heme/Allergies: Negative.  Does not bruise/bleed easily.  Psychiatric/Behavioral: Negative.  Negative for depression, memory loss and substance abuse. The patient is not nervous/anxious and does not have insomnia (intermittently poor sleep at night).   All other systems reviewed and are negative.   Performance status (ECOG): 1  Blood pressure (!)  152/78, pulse 62, temperature 98.2 F (36.8 C), temperature source Tympanic, resp. rate 18, height 5' 4"  (1.626 m), weight 179 lb 11.2 oz (81.5 kg), SpO2 100 %.   Physical Exam  Constitutional: She is oriented to person, place, and time. She appears well-developed and well-nourished. No distress.  Elderly woman sitting comfortably in the exam room in no acute distress.  She has a rolling walker at her side.  HENT:  Head: Normocephalic and atraumatic.  Mouth/Throat: Oropharynx is clear and moist.  Curly gray hair.   Eyes: Pupils are equal, round, and reactive to light. Conjunctivae and EOM are normal. No scleral icterus.  Glasses.  Blue eyes.   Neck: Normal range of motion. Neck supple. No JVD present.  Cardiovascular: Normal rate, regular rhythm and normal heart sounds.  No murmur heard. Pulmonary/Chest: Effort normal and breath sounds normal. No respiratory distress. She has no wheezes. She has no rales.  Abdominal: Soft. Bowel sounds are normal. She exhibits no distension and no mass. There is no abdominal tenderness. There  is no rebound and no guarding. A hernia is present.  Musculoskeletal: Normal range of motion.        General: Edema (bilateral ankles) present.  Lymphadenopathy:    She has no cervical adenopathy.    She has no axillary adenopathy.       Right: No supraclavicular adenopathy present.       Left: No supraclavicular adenopathy present.  Neurological: She is alert and oriented to person, place, and time.  Skin: Skin is warm and dry. No rash noted. She is not diaphoretic. No erythema. No pallor.  Psychiatric: She has a normal mood and affect. Her behavior is normal. Judgment and thought content normal.  Nursing note and vitals reviewed.   Appointment on 08/18/2018  Component Date Value Ref Range Status  . Sodium 08/18/2018 135  135 - 145 mmol/L Final  . Potassium 08/18/2018 4.3  3.5 - 5.1 mmol/L Final  . Chloride 08/18/2018 102  98 - 111 mmol/L Final  . CO2  08/18/2018 23  22 - 32 mmol/L Final  . Glucose, Bld 08/18/2018 108* 70 - 99 mg/dL Final  . BUN 08/18/2018 28* 8 - 23 mg/dL Final  . Creatinine, Ser 08/18/2018 1.43* 0.44 - 1.00 mg/dL Final  . Calcium 08/18/2018 8.7* 8.9 - 10.3 mg/dL Final  . Total Protein 08/18/2018 7.2  6.5 - 8.1 g/dL Final  . Albumin 08/18/2018 4.0  3.5 - 5.0 g/dL Final  . AST 08/18/2018 23  15 - 41 U/L Final  . ALT 08/18/2018 20  0 - 44 U/L Final  . Alkaline Phosphatase 08/18/2018 53  38 - 126 U/L Final  . Total Bilirubin 08/18/2018 0.4  0.3 - 1.2 mg/dL Final  . GFR calc non Af Amer 08/18/2018 30* >60 mL/min Final  . GFR calc Af Amer 08/18/2018 35* >60 mL/min Final  . Anion gap 08/18/2018 10  5 - 15 Final   Performed at [facility], [address], [place], [place] [address]  . WBC 08/18/2018 4.9  4.0 - 10.5 K/uL Final  . RBC 08/18/2018 4.45  3.87 - 5.11 MIL/uL Final  . Hemoglobin 08/18/2018 13.5  12.0 - 15.0 g/dL Final  . HCT 08/18/2018 40.7  36.0 - 46.0 % Final  . MCV 08/18/2018 91.5  80.0 - 100.0 fL Final  . MCH 08/18/2018 30.3  26.0 - 34.0 pg Final  . MCHC 08/18/2018 33.2  30.0 - 36.0 g/dL Final  . RDW 08/18/2018 13.3  11.5 - 15.5 % Final  . Platelets 08/18/2018 217  150 - 400 K/uL Final  . nRBC 08/18/2018 0.0  0.0 - 0.2 % Final  . Neutrophils Relative % 08/18/2018 64  % Final  . Neutro Abs 08/18/2018 3.1  1.7 - 7.7 K/uL Final  . Lymphocytes Relative 08/18/2018 21  % Final  . Lymphs Abs 08/18/2018 1.0  0.7 - 4.0 K/uL Final  . Monocytes Relative 08/18/2018 10  % Final  . Monocytes Absolute 08/18/2018 0.5  0.1 - 1.0 K/uL Final  . Eosinophils Relative 08/18/2018 5  % Final  . Eosinophils Absolute 08/18/2018 0.2  0.0 - 0.5 K/uL Final  . Basophils Relative 08/18/2018 0  % Final  . Basophils Absolute 08/18/2018 0.0  0.0 - 0.1 K/uL Final  . Immature Granulocytes 08/18/2018 0  % Final  . Abs Immature Granulocytes 08/18/2018 0.02  0.00 - 0.07 K/uL Final   Performed at [facility],  [address] [place], [place] [address]    Assessment:  Alexis Boyden  Haas is a 85 y.o. 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 Pathology revealed a 3.7 cm moderately differentiated adenocarcinoma. Tumor invaded the muscularis propria into the pericolorectal tissue. One of 28 lymph nodes were positive. Margins were negative. Pathologic stagewas T3N1a (stage III). MSI/MMR was intact.  PET scan on 05/19/2016 revealed markedly hypermetabolic lesion in the distal ascending colon extending into the proximal hepatic flexure, consistent with the known primary malignancy. There was a tiny focus of FDG uptake in the medial right liver, just minimally above the background expected mottled hepatic uptake. CT imaging suggests there may be a tiny 9 mm hypoattenuating focus at this location. While not definite, imaging features to raise concern for the possibility of metastatic disease in the liver.   PET scan on 06/26/2017 revealed no evidence of metastatic disease.  CEAhas been followed:  6.4 on 05/07/2016, 4.8 on 06/25/2016, 5.4 on 09/10/2016, 5.6 on 12/17/2016, 5.3 on 03/18/2017, 4.9 on 06/17/2017, 5.5 on 10/21/2017, and 5.3 on 02/17/2018.  She presented withchroniciron deficiency anemiasecondary to blood loss. Invasive procedures (EGD or colonoscopy) were initially deferred secondary to her age. Guaiac cards were negative (12/2014 and 10/2015) then positive (01/2016). Diet is modest. EGDon 04/15/2016 revealed a medium size hiatal hernia, nonbleeding gastric ulcer with no stigmata of bleeding.  Oral ironis ineffective. She received 2 units PRBCson 10/11/2015 for a hematocrit of 21.1 and hemoglobin 6.4. She has received Ferahemeon several occasions: 01/11/2015, 02/01/2015, 03/15/2015, 03/22/2015, 10/16/2015, 01/09/2016, 03/05/2016, and on 05/09/2016. Oral iron was discontinued  on 04/12/2018.  Ferritinhas been monitored: 5 on 01/02/2015, 36 on 01/31/2105, 26 on 03/08/2015, 184 on 05/02/2015, 7 on 10/10/2015, 91 on 11/07/2015, 7 on 01/02/2016, 14 on 03/03/2016, 33 on 04/07/2016,12 on 05/07/2016, 21 on 06/25/2016, 14 on 07/23/2016, 14 on 09/10/2016, 24 on 12/17/2016, 13 on 03/18/2017, 33 on 06/17/2017, 37 on 10/21/2017, and 100 on 02/17/2018.  She has chronic mild renal insufficiency. Creatinine is 1.43 (CrCl 22.1 ml/min) today.  She has chronic lower extremity edema (left > right).  Left lower extremity duplex on 07/23/2016 revealed no evidence of DVT.  Symptomatically, she is doing well.  She denies any abdominal symptoms.  Exm reveals bilateral ankle edema.  Plan: 1.   Labs today:  CBC with diff, CMP, CEA, ferritin. 2.  Stage III adenocarcinoma of the ascending colon             Clinically, she is doing well.  Discuss plan for follow-up colonoscopy.  Message to Dr Bary Castilla.  Discuss plan for annual imaging studies.             Patient has had an elevated CEA in the past.   CEA has ranged between 4.9 - 6.4.   CEA was 6.4 on 05/07/2016.    PET scan was negative at that time.   Discuss plan for follow-up PET scan if CEA out of range.             Continue surveillance. 3.  Iron deficiency             Hemoglobin 13.5.  MCV 91.5.             Ferritin 33. Ferritin goal is 100.             Continue Ferrous sulfate 1 tablet a day with OJ or vitamin C. 4.  Hypocalcemia             Calcium 8.7.  Encourage oral calcium. 5.  RTC in 6 months for MD assessment and labs (CBC with diff, CMP, CEA, ferritin).  Addendum:  CEA returned 7.9 (previously 5.3 on 02/17/2018) after clinic.  Patient notified.  Will schedule a PET scan to r/o metastatic disease as patient unable to tolerate IV contrast secondary to CrCl of 22 ml/min.  I discussed the assessment and treatment plan with the patient.  The patient was provided an opportunity to ask questions and all were answered.   The patient agreed with the plan and demonstrated an understanding of the instructions.  The patient was advised to call back if the symptoms worsen or if the condition fails to improve as anticipated.  I provided 20 minutes of face-to-face time during this this encounter and > 50% was spent counseling as documented under my assessment and plan.    Lequita Asal, MD, PhD    08/18/2018, 2:15 PM  I, Molly Dorshimer, am acting as Education administrator for [employer]. Mike Gip, MD, PhD.  I, Melissa C. Mike Gip, MD, have reviewed the above documentation for accuracy and completeness, and I agree with the above.

## 2018-08-18 ENCOUNTER — Encounter: Payer: Self-pay | Admitting: Hematology and Oncology

## 2018-08-18 ENCOUNTER — Inpatient Hospital Stay (HOSPITAL_BASED_OUTPATIENT_CLINIC_OR_DEPARTMENT_OTHER): Payer: Medicare HMO | Admitting: Hematology and Oncology

## 2018-08-18 ENCOUNTER — Inpatient Hospital Stay: Payer: Medicare HMO | Attending: Hematology and Oncology

## 2018-08-18 VITALS — BP 152/78 | HR 62 | Temp 98.2°F | Resp 18 | Ht 64.0 in | Wt 179.7 lb

## 2018-08-18 DIAGNOSIS — Z951 Presence of aortocoronary bypass graft: Secondary | ICD-10-CM | POA: Diagnosis not present

## 2018-08-18 DIAGNOSIS — R600 Localized edema: Secondary | ICD-10-CM | POA: Insufficient documentation

## 2018-08-18 DIAGNOSIS — Z79899 Other long term (current) drug therapy: Secondary | ICD-10-CM

## 2018-08-18 DIAGNOSIS — D500 Iron deficiency anemia secondary to blood loss (chronic): Secondary | ICD-10-CM

## 2018-08-18 DIAGNOSIS — R970 Elevated carcinoembryonic antigen [CEA]: Secondary | ICD-10-CM

## 2018-08-18 DIAGNOSIS — G479 Sleep disorder, unspecified: Secondary | ICD-10-CM | POA: Diagnosis not present

## 2018-08-18 DIAGNOSIS — C787 Secondary malignant neoplasm of liver and intrahepatic bile duct: Secondary | ICD-10-CM | POA: Insufficient documentation

## 2018-08-18 DIAGNOSIS — C182 Malignant neoplasm of ascending colon: Secondary | ICD-10-CM

## 2018-08-18 DIAGNOSIS — M25569 Pain in unspecified knee: Secondary | ICD-10-CM | POA: Insufficient documentation

## 2018-08-18 DIAGNOSIS — N182 Chronic kidney disease, stage 2 (mild): Secondary | ICD-10-CM

## 2018-08-18 DIAGNOSIS — Z933 Colostomy status: Secondary | ICD-10-CM | POA: Diagnosis not present

## 2018-08-18 DIAGNOSIS — D3501 Benign neoplasm of right adrenal gland: Secondary | ICD-10-CM | POA: Diagnosis not present

## 2018-08-18 DIAGNOSIS — C184 Malignant neoplasm of transverse colon: Secondary | ICD-10-CM | POA: Insufficient documentation

## 2018-08-18 DIAGNOSIS — Z880 Allergy status to penicillin: Secondary | ICD-10-CM

## 2018-08-18 DIAGNOSIS — Z7189 Other specified counseling: Secondary | ICD-10-CM

## 2018-08-18 LAB — COMPREHENSIVE METABOLIC PANEL
ALT: 20 U/L (ref 0–44)
AST: 23 U/L (ref 15–41)
Albumin: 4 g/dL (ref 3.5–5.0)
Alkaline Phosphatase: 53 U/L (ref 38–126)
Anion gap: 10 (ref 5–15)
BUN: 28 mg/dL — ABNORMAL HIGH (ref 8–23)
CO2: 23 mmol/L (ref 22–32)
Calcium: 8.7 mg/dL — ABNORMAL LOW (ref 8.9–10.3)
Chloride: 102 mmol/L (ref 98–111)
Creatinine, Ser: 1.43 mg/dL — ABNORMAL HIGH (ref 0.44–1.00)
GFR calc Af Amer: 35 mL/min — ABNORMAL LOW (ref >60)
GFR calc non Af Amer: 30 mL/min — ABNORMAL LOW (ref >60)
Glucose, Bld: 108 mg/dL — ABNORMAL HIGH (ref 70–99)
Potassium: 4.3 mmol/L (ref 3.5–5.1)
Sodium: 135 mmol/L (ref 135–145)
Total Bilirubin: 0.4 mg/dL (ref 0.3–1.2)
Total Protein: 7.2 g/dL (ref 6.5–8.1)

## 2018-08-18 LAB — CBC WITH DIFFERENTIAL/PLATELET
Abs Immature Granulocytes: 0.02 10*3/uL (ref 0.00–0.07)
Basophils Absolute: 0 10*3/uL (ref 0.0–0.1)
Basophils Relative: 0 %
Eosinophils Absolute: 0.2 10*3/uL (ref 0.0–0.5)
Eosinophils Relative: 5 %
HCT: 40.7 % (ref 36.0–46.0)
Hemoglobin: 13.5 g/dL (ref 12.0–15.0)
Immature Granulocytes: 0 %
Lymphocytes Relative: 21 %
Lymphs Abs: 1 10*3/uL (ref 0.7–4.0)
MCH: 30.3 pg (ref 26.0–34.0)
MCHC: 33.2 g/dL (ref 30.0–36.0)
MCV: 91.5 fL (ref 80.0–100.0)
Monocytes Absolute: 0.5 10*3/uL (ref 0.1–1.0)
Monocytes Relative: 10 %
Neutro Abs: 3.1 10*3/uL (ref 1.7–7.7)
Neutrophils Relative %: 64 %
Platelets: 217 10*3/uL (ref 150–400)
RBC: 4.45 MIL/uL (ref 3.87–5.11)
RDW: 13.3 % (ref 11.5–15.5)
WBC: 4.9 10*3/uL (ref 4.0–10.5)
nRBC: 0 % (ref 0.0–0.2)

## 2018-08-18 LAB — FERRITIN: Ferritin: 33 ng/mL (ref 11–307)

## 2018-08-19 ENCOUNTER — Telehealth: Payer: Self-pay

## 2018-08-19 LAB — CEA: CEA: 7.9 ng/mL — ABNORMAL HIGH (ref 0.0–4.7)

## 2018-08-19 NOTE — Telephone Encounter (Signed)
-----   Message from Lequita Asal, MD sent at 08/19/2018  6:08 AM EDT ----- Regarding: Please call patient  CEA is up.    We discussed PET scan yesterday if CEA was up.  Can we schedule?  M ----- Message ----- From: Buel Ream, Lab [facility] [place] Sent: 08/18/2018   1:49 PM EDT To: Lequita Asal, MD

## 2018-08-19 NOTE — Telephone Encounter (Signed)
Contacted patient and informed her of eleveated CEA levels. Advised her that Dr. Mike Gip would like to schedule a PET scan. Patient states understanding and reports she will call office back after discussing with her kids.

## 2018-08-26 ENCOUNTER — Telehealth: Payer: Self-pay

## 2018-08-26 NOTE — Telephone Encounter (Signed)
-----   Message from Arlan Organ, RN sent at 08/19/2018  9:03 AM EDT ----- Call patient to find ut if she discussed scheduling a PET scan with her kids d/t elevated CEA levels.

## 2018-08-26 NOTE — Telephone Encounter (Signed)
Contacted patient again, per her request, to inquire if she would like to have PET scan or not. Patient states she talked to her daughter and is still undecided. Patient requested nurse call her back in 2 weeks.

## 2018-08-27 ENCOUNTER — Telehealth: Payer: Self-pay

## 2018-08-27 ENCOUNTER — Other Ambulatory Visit: Payer: Self-pay | Admitting: Hematology and Oncology

## 2018-08-27 DIAGNOSIS — C182 Malignant neoplasm of ascending colon: Secondary | ICD-10-CM

## 2018-08-27 NOTE — Telephone Encounter (Signed)
Inbound call from patient stating that she would like to proceed with PET scan. Informed patient Alexis Haas would be reaching out to get her scheduled. Patient verbalizes understanding and denies any further questions or concerns.

## 2018-08-30 DIAGNOSIS — R970 Elevated carcinoembryonic antigen [CEA]: Secondary | ICD-10-CM | POA: Insufficient documentation

## 2018-08-30 HISTORY — DX: Elevated carcinoembryonic antigen (CEA): R97.0

## 2018-09-07 ENCOUNTER — Other Ambulatory Visit: Payer: Self-pay

## 2018-09-07 ENCOUNTER — Encounter
Admission: RE | Admit: 2018-09-07 | Discharge: 2018-09-07 | Disposition: A | Discharge status: Home / Self Care | Payer: Medicare HMO | Source: Ambulatory Visit | Attending: Hematology and Oncology | Admitting: Hematology and Oncology

## 2018-09-07 DIAGNOSIS — I700 Atherosclerosis of aorta: Secondary | ICD-10-CM | POA: Insufficient documentation

## 2018-09-07 DIAGNOSIS — Z79899 Other long term (current) drug therapy: Secondary | ICD-10-CM | POA: Diagnosis not present

## 2018-09-07 DIAGNOSIS — K439 Ventral hernia without obstruction or gangrene: Secondary | ICD-10-CM | POA: Diagnosis not present

## 2018-09-07 DIAGNOSIS — C787 Secondary malignant neoplasm of liver and intrahepatic bile duct: Secondary | ICD-10-CM | POA: Insufficient documentation

## 2018-09-07 DIAGNOSIS — C182 Malignant neoplasm of ascending colon: Secondary | ICD-10-CM

## 2018-09-07 DIAGNOSIS — D3501 Benign neoplasm of right adrenal gland: Secondary | ICD-10-CM | POA: Diagnosis not present

## 2018-09-07 LAB — GLUCOSE, CAPILLARY: Glucose-Capillary: 86 mg/dL (ref 70–99)

## 2018-09-07 MED ORDER — FLUDEOXYGLUCOSE F - 18 (FDG) INJECTION
9.5500 | Freq: Once | INTRAVENOUS | Status: AC | PRN
  Administered 2018-09-07: 9.55 via INTRAVENOUS

## 2018-09-08 NOTE — Progress Notes (Signed)
[facility]  [address], [address] [place], [place] [address] Phone: [phone]  Fax: [phone]   Clinic Day:  09/10/2018  Referring physician: Lynnell Jude, MD  Chief Complaint: Alexis Haas is a 85 y.o. female with stage III colon cancer and iron deficiency anemia who is seen for review of interval PET scan and discussion regarding direction of therapy.  HPI: The patient was last seen in the medical oncology clinic on 08/18/2018. At that time, she was doing well.  She denied any abdominal symptoms. Exam revealed bilateral ankle edema.  CEA was 7.9 (prior 5.3).  PET scan on 09/07/2018 revealed new right hepatic lobe metastasis and no additional evidence of metastatic disease. There was gallbladder sludge, right adrenal adenoma, and two large midline ventral abdominal and pelvic wall hernias containing unobstructed bowel.   During the interim, she "doesn't feel much different." She notes continued knee pain. She is agreeable to have her case presented at Tumor Board.   Past Medical History:  Diagnosis Date   Anemia    Arthritis    Cancer ([place]) 04/15/2016   T3, N1a adenocarcinoma of the hepatic flexure. No mismatch repair gene abnormality.    Chronic kidney disease    LABS 3/18 COMPARED WITH 1 YEAR AGO   Coronary artery disease    Fall 2018   GERD (gastroesophageal reflux disease)    Heart disease    Hypercholesteremia    Hypertension    Insomnia     Past Surgical History:  Procedure Laterality Date   CARDIAC SURGERY  2005   bypass   COLONOSCOPY WITH PROPOFOL N/A 04/15/2016   Procedure: COLONOSCOPY WITH PROPOFOL;  Surgeon: Lucilla Lame, MD;  Location: [facility] ENDOSCOPY;  Service: Endoscopy;  Laterality: N/A;   CORONARY ARTERY BYPASS GRAFT  2005   x 2   ESOPHAGOGASTRODUODENOSCOPY (EGD) WITH PROPOFOL N/A 04/15/2016   Procedure: ESOPHAGOGASTRODUODENOSCOPY (EGD) WITH PROPOFOL;  Surgeon: Lucilla Lame, MD;  Location: [facility] ENDOSCOPY;   Service: Endoscopy;  Laterality: N/A;   LAPAROSCOPIC RIGHT COLECTOMY Right 06/05/2016   Procedure: LAPAROSCOPIC RIGHT COLECTOMY;  Surgeon: Robert Bellow, MD;  Location: [facility] ORS;  Service: General;  Laterality: Right;   toe removal     TONSILLECTOMY     age 58   [place]  06/05/2016   Procedure: HERNIA REPAIR UMBILICAL ADULT;  Surgeon: Robert Bellow, MD;  Location: [facility] ORS;  Service: General;;    History reviewed. No pertinent family history.  Social History:  reports that she has never smoked. She has never used smokeless tobacco. She reports that she does not drink alcohol or use drugs.She states that she has worked her whole life (since the age of 68). She worked at [employer] part time. 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 She lives in [place] at [place]. She lives alone. Her husband died 55 years ago. Her daughter is Theodora Blow (home: [phone]; work: [phone]). The patient is alone today with her daughter Opal Sidles over the phone.   Allergies:  Allergies  Allergen Reactions   Amoxicillin Rash    Has patient had a PCN reaction causing immediate rash, facial/tongue/throat swelling, SOB or lightheadedness with hypotension:Yes Has patient had a PCN reaction causing severe rash involving mucus membranes or skin necrosis:unsure Has patient had a PCN reaction that required hospitalization:No Has patient had a PCN reaction occurring within the last 10 years:No If all of the above answers are "NO", then may proceed with Cephalosporin use.  Current Medications: Current Outpatient Medications  Medication Sig Dispense Refill   aspirin EC 81 MG tablet Take 81 mg by mouth every 4 (four) hours as needed.      ferrous sulfate 325 (65 FE) MG tablet Take 325 mg by mouth daily with breakfast.     hydrochlorothiazide (HYDRODIURIL) 25 MG tablet Take 25 mg by mouth daily.     lovastatin (MEVACOR) 40 MG tablet Take 40 mg by mouth at  bedtime.     metoprolol tartrate (LOPRESSOR) 25 MG tablet Take 25 mg by mouth 2 (two) times daily.     naproxen sodium (ANAPROX) 220 MG tablet Take 220 mg by mouth daily as needed (for pain).      omeprazole (PRILOSEC) 20 MG capsule Take 20 mg by mouth at bedtime.      No current facility-administered medications for this visit.     Review of Systems  Constitutional: Negative.  Negative for chills, diaphoresis, fever, malaise/fatigue and weight loss (up 1 lb).       Feels "like usual."    HENT: Positive for hearing loss. Negative for congestion, ear pain, nosebleeds, sinus pain and sore throat.   Eyes: Negative.  Negative for blurred vision, double vision and photophobia.  Respiratory: Negative.  Negative for cough, hemoptysis and wheezing.   Cardiovascular: Negative.  Negative for chest pain, palpitations, orthopnea, leg swelling and PND.  Gastrointestinal: Negative.  Negative for abdominal pain, blood in stool, constipation, diarrhea, melena, nausea and vomiting.  Genitourinary: Negative.  Negative for dysuria, frequency, hematuria and urgency.  Musculoskeletal: Positive for joint pain (knees). Negative for back pain and myalgias.  Skin: Negative.  Negative for rash.  Neurological: Negative.  Negative for dizziness, tingling, sensory change, speech change, focal weakness, weakness and headaches.  Endo/Heme/Allergies: Negative.  Does not bruise/bleed easily.  Psychiatric/Behavioral: Negative for depression, memory loss and substance abuse. The patient has insomnia (intermittently poor sleep at night). The patient is not nervous/anxious.   All other systems reviewed and are negative.  Performance status (ECOG): 1  Vitals Blood pressure (!) 156/51, pulse (!) 46, temperature (!) 97.5 F (36.4 C), temperature source Oral, resp. rate 18, weight 173 lb 9.8 oz (78.8 kg), SpO2 100 %.   Physical Exam  Constitutional: She is oriented to person, place, and time. She appears well-developed and  well-nourished. No distress.  Elderly woman sitting comfortably in the exam room in no acute distress.  She has a rolling walker at her side.  HENT:  Head: Normocephalic and atraumatic.  Curly gray hair. Mask.  Eyes: Conjunctivae and EOM are normal. No scleral icterus.  Glasses.  Blue eyes.   Neurological: She is alert and oriented to person, place, and time.  Skin: Skin is warm. She is not diaphoretic. No pallor.  Psychiatric: She has a normal mood and affect. Her behavior is normal. Judgment and thought content normal.  Nursing note and vitals reviewed.   No visits with results within 3 Day(s) from this visit.  Latest known visit with results is:  Hospital Outpatient Visit on 09/07/2018  Component Date Value Ref Range Status   Glucose-Capillary 09/07/2018 86  70 - 99 mg/dL Final    Assessment:  Alexis Haas is a 85 y.o. female with stage IIIadenocarcinoma of the transverse colons/p laparoscopic assisted right hemicolectomy with ileotransverse colostomy on 06/05/2016. Pathology revealed a 3.7 cm moderately differentiated adenocarcinoma. Tumor invaded the muscularis propria into the pericolorectal tissue. One of 28 lymph nodes were positive. Margins were negative. Pathologic stagewas T3N1a (  stage III). MSI/MMR was intact.  PET scan on 05/19/2016 revealed markedly hypermetabolic lesion in the distal ascending colon extending into the proximal hepatic flexure, consistent with the known primary malignancy. There was a tiny focus of FDG uptake in the medial right liver, just minimally above the background expected mottled hepatic uptake. CT imaging suggests there may be a tiny 9 mm hypoattenuating focus at this location. While not definite, imaging features to raise concern for the possibility of metastatic disease in the liver.   PET scanon 06/26/2017 revealed no evidence of metastatic disease.  PET scan on 09/07/2018 revealed new right hepatic lobe metastasis and no  additional evidence of metastatic disease.   CEAhas been followed: 6.4 on 05/07/2016, 4.8 on 06/25/2016, 5.4 on 09/10/2016, 5.6 on 12/17/2016, 5.3 on 03/18/2017, 4.9 on 06/17/2017, 5.5 on 10/21/2017, 5.3 on 02/17/2018, and 7.9 on 08/18/2018.  She presented withchroniciron deficiency anemiasecondary to blood loss. Invasive procedures (EGD or colonoscopy) were initially deferred secondary to her age. Guaiac cards were negative (12/2014 and 10/2015) then positive (01/2016). Diet is modest. EGDon 04/15/2016 revealed a medium size hiatal hernia, nonbleeding gastric ulcer with no stigmata of bleeding.  Oral ironis ineffective. She received 2 units PRBCson 10/11/2015 for a hematocrit of 21.1 and hemoglobin 6.4. She has received Ferahemeon several occasions: 01/11/2015, 02/01/2015, 03/15/2015, 03/22/2015, 10/16/2015, 01/09/2016, 03/05/2016, and on 05/09/2016. Oral iron was discontinued on 04/12/2018.  Ferritinhas been monitored: 5 on 01/02/2015, 36 on 01/31/2105, 26 on 03/08/2015, 184 on 05/02/2015, 7 on 10/10/2015, 91 on 11/07/2015, 7 on 01/02/2016, 14 on 03/03/2016, 33 on 04/07/2016,12 on 05/07/2016, 21 on 06/25/2016, 14 on 07/23/2016, 14 on 09/10/2016, 24 on 12/17/2016, 13 on 03/18/2017, 33 on 06/17/2017, 37 on 10/21/2017, 100 on 02/17/2018, and 33 on 08/18/2018.  She has chronic mild renal insufficiency. Creatinine is 1.43 (CrCl 22.1 ml/min) today.  She has chronic lower extremity edema(left >right). Left lower extremity duplex on 07/23/2016 revealed no evidence of DVT.  PET scan on 09/07/2018 revealed new right hepatic lobe metastasis and no additional evidence of metastatic disease. There was gallbladder sludge, right adrenal adenoma, and two large midline ventral abdominal and pelvic wall hernias containing unobstructed bowel.   Symptomatically, she denies any abdominal pain.  Plan: 1.  Review PET scan.  2. Stage III adenocarcinoma of the ascending  colon Clinically, she is doing well.             Patient is s/p right hemicolectomy.  Initial pathology revealed 1 of 28 lymph nodes positive.  CEA was 7.9 on 08/18/2018.  Review interval PET scan.  Images personally reviewed.  Agree with radiology interpretation.   She has an isolated metastasis in the liver.  Discuss treatment options.   She is not a candidate for surgical resection given her age.   Discuss potential liver directed therapy (ablation or SBRT).   Discuss potential chemotherapy (Xeloda or 5FU).    Given patient's CrCl < 30 ml/min, she is not a candidate for Xeloda.    Patient unsure if she wants any chemotherapy.  Present at tumor board. 3.  MD to call patient after tumor board. 4.   RTC in 1 month for MD assessment and labs (CBC with diff, CMP).  I discussed the assessment and treatment plan with the patient.  The patient was provided an opportunity to ask questions and all were answered.  The patient agreed with the plan and demonstrated an understanding of the instructions.  The patient was advised to call back if the symptoms worsen or  if the condition fails to improve as anticipated.  I provided 25 minutes of face-to-face time during this this encounter and > 50% was spent counseling as documented under my assessment and plan.    Lequita Asal, MD, PhD    09/10/2018, 3:12 PM  I, Molly Dorshimer, am acting as Education administrator for [employer]. Mike Gip, MD, PhD.  I, Lubna Stegeman C. Mike Gip, MD, have reviewed the above documentation for accuracy and completeness, and I agree with the above.

## 2018-09-09 ENCOUNTER — Other Ambulatory Visit: Payer: Self-pay

## 2018-09-10 ENCOUNTER — Inpatient Hospital Stay (HOSPITAL_BASED_OUTPATIENT_CLINIC_OR_DEPARTMENT_OTHER): Payer: Medicare HMO | Admitting: Hematology and Oncology

## 2018-09-10 ENCOUNTER — Encounter: Payer: Self-pay | Admitting: Hematology and Oncology

## 2018-09-10 ENCOUNTER — Telehealth: Payer: Self-pay

## 2018-09-10 VITALS — BP 156/51 | HR 46 | Temp 97.5°F | Resp 18 | Wt 173.6 lb

## 2018-09-10 DIAGNOSIS — C182 Malignant neoplasm of ascending colon: Secondary | ICD-10-CM

## 2018-09-10 DIAGNOSIS — C787 Secondary malignant neoplasm of liver and intrahepatic bile duct: Secondary | ICD-10-CM

## 2018-09-10 DIAGNOSIS — C184 Malignant neoplasm of transverse colon: Secondary | ICD-10-CM | POA: Diagnosis not present

## 2018-09-10 NOTE — Progress Notes (Signed)
Pt here for follow up. Denies any concerns.  

## 2018-09-10 NOTE — Telephone Encounter (Signed)
Spoke with Alexis Haas with Dr. Clemmie Krill' office and informed her of low HR for visit today. Alexis Haas made aware that patient is asymptomatic at this time and denies ever having symptoms of dizziness, chest pain, lightheadedness, etc. Alexis Haas spoke with Dr. Clemmie Krill and it was advised that patient hold Metoprolol dose tonight and to check HR infrequently while at home and reports numbers to PCP office in 1 week. Talked with Alexis Haas and patient and informed them of recommendations and to go to ED should patient develop any chest pain, dizziness, lightheadedness, etc. Alexis Haas and patient verbalize understanding and denies any further questions or concerns.

## 2018-09-16 ENCOUNTER — Other Ambulatory Visit: Payer: Medicare HMO

## 2018-09-16 NOTE — Progress Notes (Signed)
Tumor Board Documentation  Alexis Haas was presented by Dr Mike Gip at our Tumor Board on 09/16/2018, which included representatives from medical oncology, radiation oncology, pathology, radiology, surgical, navigation, internal medicine, research, palliative care.  Alexis Haas currently presents as a current patient, for discussion, for progression with history of the following treatments: active survellience.  Additionally, we reviewed previous medical and familial history, history of present illness, and recent lab results along with all available histopathologic and imaging studies. The tumor board considered available treatment options and made the following recommendations:   Consider Y- 90 vs no treatment  The following procedures/referrals were also placed: No orders of the defined types were placed in this encounter.   Clinical Trial Status: not discussed   Staging used:    National site-specific guidelines   were discussed with respect to the case.  Tumor board is a meeting of clinicians from various specialty areas who evaluate and discuss patients for whom a multidisciplinary approach is being considered. Final determinations in the plan of care are those of the provider(s). The responsibility for follow up of recommendations given during tumor board is that of the provider.   Today's extended care, comprehensive team conference, Alexis Haas was not present for the discussion and was not examined.   Multidisciplinary Tumor Board is a multidisciplinary case peer review process.  Decisions discussed in the Multidisciplinary Tumor Board reflect the opinions of the specialists present at the conference without having examined the patient.  Ultimately, treatment and diagnostic decisions rest with the primary provider(s) and the patient.

## 2018-09-17 DIAGNOSIS — C787 Secondary malignant neoplasm of liver and intrahepatic bile duct: Secondary | ICD-10-CM | POA: Insufficient documentation

## 2018-09-17 HISTORY — DX: Secondary malignant neoplasm of liver and intrahepatic bile duct: C78.7

## 2018-12-27 ENCOUNTER — Emergency Department (HOSPITAL_COMMUNITY): Payer: Medicare HMO | Admitting: Certified Registered Nurse Anesthetist

## 2018-12-27 ENCOUNTER — Emergency Department: Payer: Medicare HMO

## 2018-12-27 ENCOUNTER — Inpatient Hospital Stay (HOSPITAL_COMMUNITY)
Admission: EM | Admit: 2018-12-27 | Discharge: 2019-01-03 | DRG: 024 | Disposition: A | Discharge status: Home Health | Payer: Medicare HMO | Attending: Neurology | Admitting: Neurology

## 2018-12-27 ENCOUNTER — Ambulatory Visit: Admit: 2018-12-27 | Payer: Medicare HMO

## 2018-12-27 ENCOUNTER — Other Ambulatory Visit: Payer: Self-pay

## 2018-12-27 ENCOUNTER — Emergency Department
Admission: EM | Admit: 2018-12-27 | Discharge: 2018-12-27 | Disposition: A | Discharge status: Other Acute Inpatient Hospital | Payer: Medicare HMO | Attending: Emergency Medicine | Admitting: Emergency Medicine

## 2018-12-27 ENCOUNTER — Emergency Department (HOSPITAL_COMMUNITY): Payer: Medicare HMO

## 2018-12-27 ENCOUNTER — Encounter (HOSPITAL_COMMUNITY)
Admission: EM | Disposition: A | Discharge status: Home Health | Payer: Self-pay | Source: Home / Self Care | Attending: Neurology

## 2018-12-27 ENCOUNTER — Encounter: Payer: Self-pay | Admitting: Emergency Medicine

## 2018-12-27 ENCOUNTER — Inpatient Hospital Stay (HOSPITAL_COMMUNITY): Payer: Medicare HMO

## 2018-12-27 DIAGNOSIS — N3090 Cystitis, unspecified without hematuria: Secondary | ICD-10-CM | POA: Diagnosis present

## 2018-12-27 DIAGNOSIS — M1990 Unspecified osteoarthritis, unspecified site: Secondary | ICD-10-CM | POA: Diagnosis present

## 2018-12-27 DIAGNOSIS — R1310 Dysphagia, unspecified: Secondary | ICD-10-CM | POA: Diagnosis present

## 2018-12-27 DIAGNOSIS — R339 Retention of urine, unspecified: Secondary | ICD-10-CM | POA: Diagnosis present

## 2018-12-27 DIAGNOSIS — Z951 Presence of aortocoronary bypass graft: Secondary | ICD-10-CM

## 2018-12-27 DIAGNOSIS — I63511 Cerebral infarction due to unspecified occlusion or stenosis of right middle cerebral artery: Principal | ICD-10-CM | POA: Diagnosis present

## 2018-12-27 DIAGNOSIS — H5340 Unspecified visual field defects: Secondary | ICD-10-CM | POA: Diagnosis present

## 2018-12-27 DIAGNOSIS — I2510 Atherosclerotic heart disease of native coronary artery without angina pectoris: Secondary | ICD-10-CM | POA: Diagnosis present

## 2018-12-27 DIAGNOSIS — G8194 Hemiplegia, unspecified affecting left nondominant side: Secondary | ICD-10-CM | POA: Diagnosis present

## 2018-12-27 DIAGNOSIS — R531 Weakness: Secondary | ICD-10-CM | POA: Diagnosis present

## 2018-12-27 DIAGNOSIS — N189 Chronic kidney disease, unspecified: Secondary | ICD-10-CM | POA: Insufficient documentation

## 2018-12-27 DIAGNOSIS — E876 Hypokalemia: Secondary | ICD-10-CM | POA: Diagnosis present

## 2018-12-27 DIAGNOSIS — N1832 Chronic kidney disease, stage 3b: Secondary | ICD-10-CM | POA: Diagnosis present

## 2018-12-27 DIAGNOSIS — Z85038 Personal history of other malignant neoplasm of large intestine: Secondary | ICD-10-CM

## 2018-12-27 DIAGNOSIS — R29706 NIHSS score 6: Secondary | ICD-10-CM | POA: Diagnosis present

## 2018-12-27 DIAGNOSIS — Z978 Presence of other specified devices: Secondary | ICD-10-CM

## 2018-12-27 DIAGNOSIS — R414 Neurologic neglect syndrome: Secondary | ICD-10-CM | POA: Diagnosis present

## 2018-12-27 DIAGNOSIS — I129 Hypertensive chronic kidney disease with stage 1 through stage 4 chronic kidney disease, or unspecified chronic kidney disease: Secondary | ICD-10-CM | POA: Diagnosis present

## 2018-12-27 DIAGNOSIS — R471 Dysarthria and anarthria: Secondary | ICD-10-CM | POA: Diagnosis present

## 2018-12-27 DIAGNOSIS — I441 Atrioventricular block, second degree: Secondary | ICD-10-CM | POA: Diagnosis present

## 2018-12-27 DIAGNOSIS — R4182 Altered mental status, unspecified: Secondary | ICD-10-CM | POA: Diagnosis present

## 2018-12-27 DIAGNOSIS — E785 Hyperlipidemia, unspecified: Secondary | ICD-10-CM | POA: Diagnosis present

## 2018-12-27 DIAGNOSIS — I639 Cerebral infarction, unspecified: Secondary | ICD-10-CM | POA: Diagnosis present

## 2018-12-27 DIAGNOSIS — Z79899 Other long term (current) drug therapy: Secondary | ICD-10-CM | POA: Insufficient documentation

## 2018-12-27 DIAGNOSIS — I671 Cerebral aneurysm, nonruptured: Secondary | ICD-10-CM | POA: Diagnosis present

## 2018-12-27 DIAGNOSIS — H547 Unspecified visual loss: Secondary | ICD-10-CM | POA: Diagnosis present

## 2018-12-27 DIAGNOSIS — B952 Enterococcus as the cause of diseases classified elsewhere: Secondary | ICD-10-CM | POA: Diagnosis present

## 2018-12-27 DIAGNOSIS — I10 Essential (primary) hypertension: Secondary | ICD-10-CM | POA: Diagnosis not present

## 2018-12-27 DIAGNOSIS — H9190 Unspecified hearing loss, unspecified ear: Secondary | ICD-10-CM | POA: Diagnosis present

## 2018-12-27 DIAGNOSIS — R739 Hyperglycemia, unspecified: Secondary | ICD-10-CM | POA: Diagnosis present

## 2018-12-27 DIAGNOSIS — I6389 Other cerebral infarction: Secondary | ICD-10-CM | POA: Diagnosis not present

## 2018-12-27 DIAGNOSIS — I672 Cerebral atherosclerosis: Secondary | ICD-10-CM | POA: Diagnosis present

## 2018-12-27 DIAGNOSIS — B9620 Unspecified Escherichia coli [E. coli] as the cause of diseases classified elsewhere: Secondary | ICD-10-CM | POA: Diagnosis present

## 2018-12-27 DIAGNOSIS — R1312 Dysphagia, oropharyngeal phase: Secondary | ICD-10-CM | POA: Diagnosis not present

## 2018-12-27 DIAGNOSIS — J95821 Acute postprocedural respiratory failure: Secondary | ICD-10-CM

## 2018-12-27 DIAGNOSIS — I6601 Occlusion and stenosis of right middle cerebral artery: Secondary | ICD-10-CM

## 2018-12-27 DIAGNOSIS — C787 Secondary malignant neoplasm of liver and intrahepatic bile duct: Secondary | ICD-10-CM | POA: Diagnosis present

## 2018-12-27 DIAGNOSIS — R4189 Other symptoms and signs involving cognitive functions and awareness: Secondary | ICD-10-CM | POA: Diagnosis present

## 2018-12-27 DIAGNOSIS — I959 Hypotension, unspecified: Secondary | ICD-10-CM | POA: Diagnosis present

## 2018-12-27 DIAGNOSIS — Z7982 Long term (current) use of aspirin: Secondary | ICD-10-CM

## 2018-12-27 DIAGNOSIS — Z20828 Contact with and (suspected) exposure to other viral communicable diseases: Secondary | ICD-10-CM | POA: Diagnosis present

## 2018-12-27 DIAGNOSIS — I4891 Unspecified atrial fibrillation: Secondary | ICD-10-CM | POA: Diagnosis present

## 2018-12-27 DIAGNOSIS — J9690 Respiratory failure, unspecified, unspecified whether with hypoxia or hypercapnia: Secondary | ICD-10-CM

## 2018-12-27 DIAGNOSIS — Z880 Allergy status to penicillin: Secondary | ICD-10-CM

## 2018-12-27 DIAGNOSIS — R0689 Other abnormalities of breathing: Secondary | ICD-10-CM | POA: Diagnosis present

## 2018-12-27 DIAGNOSIS — I63411 Cerebral infarction due to embolism of right middle cerebral artery: Secondary | ICD-10-CM | POA: Diagnosis not present

## 2018-12-27 DIAGNOSIS — N390 Urinary tract infection, site not specified: Secondary | ICD-10-CM | POA: Diagnosis not present

## 2018-12-27 DIAGNOSIS — R338 Other retention of urine: Secondary | ICD-10-CM | POA: Diagnosis not present

## 2018-12-27 DIAGNOSIS — N179 Acute kidney failure, unspecified: Secondary | ICD-10-CM | POA: Diagnosis present

## 2018-12-27 DIAGNOSIS — I480 Paroxysmal atrial fibrillation: Secondary | ICD-10-CM | POA: Diagnosis not present

## 2018-12-27 DIAGNOSIS — K219 Gastro-esophageal reflux disease without esophagitis: Secondary | ICD-10-CM | POA: Diagnosis present

## 2018-12-27 DIAGNOSIS — D649 Anemia, unspecified: Secondary | ICD-10-CM | POA: Diagnosis present

## 2018-12-27 HISTORY — PX: RADIOLOGY WITH ANESTHESIA: SHX6223

## 2018-12-27 HISTORY — DX: Occlusion and stenosis of right middle cerebral artery: I66.01

## 2018-12-27 HISTORY — PX: IR ANGIO VERTEBRAL SEL SUBCLAVIAN INNOMINATE UNI R MOD SED: IMG5365

## 2018-12-27 HISTORY — PX: IR PERCUTANEOUS ART THROMBECTOMY/INFUSION INTRACRANIAL INC DIAG ANGIO: IMG6087

## 2018-12-27 HISTORY — PX: IR CT HEAD LTD: IMG2386

## 2018-12-27 LAB — DIFFERENTIAL
Abs Immature Granulocytes: 0.04 10*3/uL (ref 0.00–0.07)
Basophils Absolute: 0 10*3/uL (ref 0.0–0.1)
Basophils Relative: 0 %
Eosinophils Absolute: 0 10*3/uL (ref 0.0–0.5)
Eosinophils Relative: 0 %
Immature Granulocytes: 0 %
Lymphocytes Relative: 5 %
Lymphs Abs: 0.7 10*3/uL (ref 0.7–4.0)
Monocytes Absolute: 1 10*3/uL (ref 0.1–1.0)
Monocytes Relative: 8 %
Neutro Abs: 10.5 10*3/uL — ABNORMAL HIGH (ref 1.7–7.7)
Neutrophils Relative %: 87 %

## 2018-12-27 LAB — URINALYSIS, COMPLETE (UACMP) WITH MICROSCOPIC
Bilirubin Urine: NEGATIVE
Glucose, UA: NEGATIVE mg/dL
Hgb urine dipstick: NEGATIVE
Ketones, ur: NEGATIVE mg/dL
Nitrite: NEGATIVE
Protein, ur: 30 mg/dL — AB
Specific Gravity, Urine: 1.014 (ref 1.005–1.030)
pH: 7 (ref 5.0–8.0)

## 2018-12-27 LAB — URINE DRUG SCREEN, QUALITATIVE (ARMC ONLY)
Amphetamines, Ur Screen: NOT DETECTED
Barbiturates, Ur Screen: NOT DETECTED
Benzodiazepine, Ur Scrn: NOT DETECTED
Cannabinoid 50 Ng, Ur ~~LOC~~: NOT DETECTED
Cocaine Metabolite,Ur ~~LOC~~: NOT DETECTED
MDMA (Ecstasy)Ur Screen: NOT DETECTED
Methadone Scn, Ur: NOT DETECTED
Opiate, Ur Screen: NOT DETECTED
Phencyclidine (PCP) Ur S: NOT DETECTED
Tricyclic, Ur Screen: NOT DETECTED

## 2018-12-27 LAB — COMPREHENSIVE METABOLIC PANEL
ALT: 17 U/L (ref 0–44)
AST: 33 U/L (ref 15–41)
Albumin: 4.4 g/dL (ref 3.5–5.0)
Alkaline Phosphatase: 55 U/L (ref 38–126)
Anion gap: 15 (ref 5–15)
BUN: 31 mg/dL — ABNORMAL HIGH (ref 8–23)
CO2: 22 mmol/L (ref 22–32)
Calcium: 9.5 mg/dL (ref 8.9–10.3)
Chloride: 99 mmol/L (ref 98–111)
Creatinine, Ser: 1.25 mg/dL — ABNORMAL HIGH (ref 0.44–1.00)
GFR calc Af Amer: 41 mL/min — ABNORMAL LOW (ref >60)
GFR calc non Af Amer: 36 mL/min — ABNORMAL LOW (ref >60)
Glucose, Bld: 140 mg/dL — ABNORMAL HIGH (ref 70–99)
Potassium: 3.8 mmol/L (ref 3.5–5.1)
Sodium: 136 mmol/L (ref 135–145)
Total Bilirubin: 1 mg/dL (ref 0.3–1.2)
Total Protein: 7.6 g/dL (ref 6.5–8.1)

## 2018-12-27 LAB — CBC
HCT: 45.2 % (ref 36.0–46.0)
Hemoglobin: 15.6 g/dL — ABNORMAL HIGH (ref 12.0–15.0)
MCH: 29.3 pg (ref 26.0–34.0)
MCHC: 34.5 g/dL (ref 30.0–36.0)
MCV: 85 fL (ref 80.0–100.0)
Platelets: 211 10*3/uL (ref 150–400)
RBC: 5.32 MIL/uL — ABNORMAL HIGH (ref 3.87–5.11)
RDW: 13.2 % (ref 11.5–15.5)
WBC: 12.2 10*3/uL — ABNORMAL HIGH (ref 4.0–10.5)
nRBC: 0 % (ref 0.0–0.2)

## 2018-12-27 LAB — POCT I-STAT 7, (LYTES, BLD GAS, ICA,H+H)
Acid-base deficit: 5 mmol/L — ABNORMAL HIGH (ref 0.0–2.0)
Bicarbonate: 19.1 mmol/L — ABNORMAL LOW (ref 20.0–28.0)
Calcium, Ion: 1.08 mmol/L — ABNORMAL LOW (ref 1.15–1.40)
HCT: 36 % (ref 36.0–46.0)
Hemoglobin: 12.2 g/dL (ref 12.0–15.0)
O2 Saturation: 99 %
Potassium: 3.2 mmol/L — ABNORMAL LOW (ref 3.5–5.1)
Sodium: 136 mmol/L (ref 135–145)
TCO2: 20 mmol/L — ABNORMAL LOW (ref 22–32)
pCO2 arterial: 32.5 mmHg (ref 32.0–48.0)
pH, Arterial: 7.378 (ref 7.350–7.450)
pO2, Arterial: 140 mmHg — ABNORMAL HIGH (ref 83.0–108.0)

## 2018-12-27 LAB — PROTIME-INR
INR: 0.9 (ref 0.8–1.2)
Prothrombin Time: 12.5 seconds (ref 11.4–15.2)

## 2018-12-27 LAB — SARS CORONAVIRUS 2 (TAT 6-24 HRS): SARS Coronavirus 2: NEGATIVE

## 2018-12-27 LAB — APTT: aPTT: 26 seconds (ref 24–36)

## 2018-12-27 LAB — ETHANOL: Alcohol, Ethyl (B): <10 mg/dL (ref <10)

## 2018-12-27 LAB — MRSA PCR SCREENING: MRSA by PCR: NEGATIVE

## 2018-12-27 LAB — GLUCOSE, CAPILLARY: Glucose-Capillary: 166 mg/dL — ABNORMAL HIGH (ref 70–99)

## 2018-12-27 SURGERY — RADIOLOGY WITH ANESTHESIA
Anesthesia: General

## 2018-12-27 MED ORDER — ACETAMINOPHEN 160 MG/5ML PO SOLN: 650.0000 mg | ORAL | Status: DC | PRN

## 2018-12-27 MED ORDER — ASPIRIN 300 MG RE SUPP
300.0000 mg | Freq: Every day | RECTAL | Status: DC
  Administered 2018-12-28: 300 mg via RECTAL
  Filled 2018-12-27: qty 1

## 2018-12-27 MED ORDER — ACETAMINOPHEN 650 MG RE SUPP
650.0000 mg | RECTAL | Status: DC | PRN
  Administered 2018-12-28: 650 mg via RECTAL
  Filled 2018-12-27: qty 1

## 2018-12-27 MED ORDER — FENTANYL CITRATE (PF) 100 MCG/2ML IJ SOLN
25.0000 ug | INTRAMUSCULAR | Status: DC | PRN
  Administered 2018-12-27 – 2018-12-28 (×7): 100 ug via INTRAVENOUS
  Filled 2018-12-27 (×6): qty 2

## 2018-12-27 MED ORDER — ACETAMINOPHEN 650 MG RE SUPP: 650.0000 mg | RECTAL | Status: DC | PRN

## 2018-12-27 MED ORDER — FENTANYL CITRATE (PF) 100 MCG/2ML IJ SOLN
INTRAMUSCULAR | Status: AC
  Filled 2018-12-27: qty 2

## 2018-12-27 MED ORDER — GLYCOPYRROLATE 0.2 MG/ML IJ SOLN
INTRAMUSCULAR | Status: DC | PRN
  Administered 2018-12-27: 0.2 mg via INTRAVENOUS

## 2018-12-27 MED ORDER — SODIUM CHLORIDE 0.9 % IV SOLN
250.0000 mL | INTRAVENOUS | Status: DC
  Administered 2018-12-28: 250 mL via INTRAVENOUS

## 2018-12-27 MED ORDER — PROPOFOL 500 MG/50ML IV EMUL
INTRAVENOUS | Status: DC | PRN
  Administered 2018-12-27: 50 ug/kg/min via INTRAVENOUS

## 2018-12-27 MED ORDER — SENNOSIDES-DOCUSATE SODIUM 8.6-50 MG PO TABS: 1.0000 | ORAL_TABLET | Freq: Every evening | ORAL | Status: DC | PRN

## 2018-12-27 MED ORDER — CEFAZOLIN SODIUM-DEXTROSE 2-3 GM-%(50ML) IV SOLR
INTRAVENOUS | Status: DC | PRN
  Administered 2018-12-27: 2 g via INTRAVENOUS

## 2018-12-27 MED ORDER — ALBUTEROL SULFATE (2.5 MG/3ML) 0.083% IN NEBU: 2.5000 mg | INHALATION_SOLUTION | RESPIRATORY_TRACT | Status: DC | PRN

## 2018-12-27 MED ORDER — SUCCINYLCHOLINE CHLORIDE 200 MG/10ML IV SOSY
PREFILLED_SYRINGE | INTRAVENOUS | Status: DC | PRN
  Administered 2018-12-27: 140 mg via INTRAVENOUS

## 2018-12-27 MED ORDER — ASPIRIN 81 MG PO CHEW
CHEWABLE_TABLET | ORAL | Status: AC
  Filled 2018-12-27: qty 1

## 2018-12-27 MED ORDER — PROPOFOL 10 MG/ML IV BOLUS
INTRAVENOUS | Status: DC | PRN
  Administered 2018-12-27: 90 mg via INTRAVENOUS
  Administered 2018-12-27: 30 mg via INTRAVENOUS

## 2018-12-27 MED ORDER — SODIUM CHLORIDE 0.9 % IV SOLN
2.0000 g | INTRAVENOUS | Status: DC
  Administered 2018-12-27 – 2018-12-28 (×2): 2 g via INTRAVENOUS
  Filled 2018-12-27 (×3): qty 20

## 2018-12-27 MED ORDER — NITROGLYCERIN 1 MG/10 ML FOR IR/CATH LAB
INTRA_ARTERIAL | Status: DC | PRN
  Administered 2018-12-27 (×6): 25 ug via INTRA_ARTERIAL

## 2018-12-27 MED ORDER — SODIUM CHLORIDE 0.9 % IV SOLN
INTRAVENOUS | Status: AC
  Administered 2018-12-28: 02:00:00 via INTRAVENOUS

## 2018-12-27 MED ORDER — CLEVIDIPINE BUTYRATE 0.5 MG/ML IV EMUL
0.0000 mg/h | INTRAVENOUS | Status: AC
  Administered 2018-12-27: 17:00:00 10 mg/h via INTRAVENOUS
  Administered 2018-12-28: 2 mg/h via INTRAVENOUS
  Filled 2018-12-27 (×2): qty 50

## 2018-12-27 MED ORDER — CLOPIDOGREL BISULFATE 300 MG PO TABS
ORAL_TABLET | ORAL | Status: AC
  Filled 2018-12-27: qty 1

## 2018-12-27 MED ORDER — PANTOPRAZOLE SODIUM 40 MG IV SOLR
40.0000 mg | Freq: Every day | INTRAVENOUS | Status: DC
  Administered 2018-12-27 – 2018-12-28 (×2): 40 mg via INTRAVENOUS
  Filled 2018-12-27 (×2): qty 40

## 2018-12-27 MED ORDER — IOHEXOL 300 MG/ML  SOLN
150.0000 mL | Freq: Once | INTRAMUSCULAR | Status: AC | PRN
  Administered 2018-12-27: 75 mL via INTRA_ARTERIAL

## 2018-12-27 MED ORDER — IOHEXOL 300 MG/ML  SOLN
150.0000 mL | Freq: Once | INTRAMUSCULAR | Status: AC | PRN
  Administered 2018-12-27: 50 mL via INTRA_ARTERIAL

## 2018-12-27 MED ORDER — HYDRALAZINE HCL 20 MG/ML IJ SOLN
10.0000 mg | INTRAMUSCULAR | Status: DC | PRN
  Administered 2018-12-27 – 2018-12-28 (×2): 10 mg via INTRAVENOUS
  Filled 2018-12-27 (×2): qty 1

## 2018-12-27 MED ORDER — NITROGLYCERIN 1 MG/10 ML FOR IR/CATH LAB
INTRA_ARTERIAL | Status: AC
  Filled 2018-12-27: qty 10

## 2018-12-27 MED ORDER — TIROFIBAN HCL IN NACL 5-0.9 MG/100ML-% IV SOLN
INTRAVENOUS | Status: AC
  Filled 2018-12-27: qty 100

## 2018-12-27 MED ORDER — PHENYLEPHRINE 40 MCG/ML (10ML) SYRINGE FOR IV PUSH (FOR BLOOD PRESSURE SUPPORT)
PREFILLED_SYRINGE | INTRAVENOUS | Status: DC | PRN
  Administered 2018-12-27: 80 ug via INTRAVENOUS
  Administered 2018-12-27: 120 ug via INTRAVENOUS

## 2018-12-27 MED ORDER — EPHEDRINE SULFATE-NACL 50-0.9 MG/10ML-% IV SOSY
PREFILLED_SYRINGE | INTRAVENOUS | Status: DC | PRN
  Administered 2018-12-27: 5 mg via INTRAVENOUS

## 2018-12-27 MED ORDER — BISACODYL 10 MG RE SUPP: 10.0000 mg | Freq: Every day | RECTAL | Status: DC | PRN

## 2018-12-27 MED ORDER — ACETAMINOPHEN 325 MG PO TABS
650.0000 mg | ORAL_TABLET | ORAL | Status: DC | PRN
  Administered 2018-12-30 (×2): 650 mg via ORAL
  Filled 2018-12-27 (×2): qty 2

## 2018-12-27 MED ORDER — STROKE: EARLY STAGES OF RECOVERY BOOK
Freq: Once | Status: DC
  Filled 2018-12-27: qty 1

## 2018-12-27 MED ORDER — SODIUM CHLORIDE 0.9 % IV SOLN
INTRAVENOUS | Status: DC
  Administered 2018-12-27 (×2): via INTRAVENOUS

## 2018-12-27 MED ORDER — ROCURONIUM BROMIDE 50 MG/5ML IV SOSY
PREFILLED_SYRINGE | INTRAVENOUS | Status: DC | PRN
  Administered 2018-12-27: 50 mg via INTRAVENOUS
  Administered 2018-12-27: 10 mg via INTRAVENOUS

## 2018-12-27 MED ORDER — TRIAMTERENE-HCTZ 37.5-25 MG PO TABS: 1.0000 | ORAL_TABLET | Freq: Every day | ORAL | Status: DC

## 2018-12-27 MED ORDER — TICAGRELOR 90 MG PO TABS
ORAL_TABLET | ORAL | Status: AC
  Filled 2018-12-27: qty 2

## 2018-12-27 MED ORDER — ACETAMINOPHEN 325 MG PO TABS: 650.0000 mg | ORAL_TABLET | ORAL | Status: DC | PRN

## 2018-12-27 MED ORDER — IOHEXOL 350 MG/ML SOLN
100.0000 mL | Freq: Once | INTRAVENOUS | Status: AC | PRN
  Administered 2018-12-27: 11:00:00 100 mL via INTRAVENOUS

## 2018-12-27 MED ORDER — LIDOCAINE 2% (20 MG/ML) 5 ML SYRINGE: INTRAMUSCULAR | Status: DC | PRN

## 2018-12-27 MED ORDER — CEFAZOLIN SODIUM-DEXTROSE 2-4 GM/100ML-% IV SOLN
INTRAVENOUS | Status: AC
  Filled 2018-12-27: qty 100

## 2018-12-27 MED ORDER — ASPIRIN EC 325 MG PO TBEC
325.0000 mg | DELAYED_RELEASE_TABLET | Freq: Every day | ORAL | Status: DC
  Administered 2018-12-29 – 2018-12-31 (×3): 325 mg via ORAL
  Filled 2018-12-27 (×4): qty 1

## 2018-12-27 MED ORDER — ORAL CARE MOUTH RINSE
15.0000 mL | OROMUCOSAL | Status: DC
  Administered 2018-12-27 – 2018-12-28 (×6): 15 mL via OROMUCOSAL

## 2018-12-27 MED ORDER — DEXMEDETOMIDINE HCL IN NACL 400 MCG/100ML IV SOLN
0.0000 ug/kg/h | INTRAVENOUS | Status: DC
  Administered 2018-12-27: 0.4 ug/kg/h via INTRAVENOUS
  Filled 2018-12-27: qty 100

## 2018-12-27 MED ORDER — CHLORHEXIDINE GLUCONATE 0.12% ORAL RINSE (MEDLINE KIT)
15.0000 mL | Freq: Two times a day (BID) | OROMUCOSAL | Status: DC
  Administered 2018-12-27 – 2019-01-02 (×13): 15 mL via OROMUCOSAL

## 2018-12-27 MED ORDER — EPTIFIBATIDE 20 MG/10ML IV SOLN
INTRAVENOUS | Status: AC
  Filled 2018-12-27: qty 10

## 2018-12-27 MED ORDER — PHENYLEPHRINE HCL-NACL 10-0.9 MG/250ML-% IV SOLN
INTRAVENOUS | Status: DC | PRN
  Administered 2018-12-27: 20 ug/min via INTRAVENOUS

## 2018-12-27 MED ORDER — METOPROLOL TARTRATE 25 MG PO TABS: 25.0000 mg | ORAL_TABLET | Freq: Every day | ORAL | Status: DC

## 2018-12-27 MED ORDER — ONDANSETRON HCL 4 MG/2ML IJ SOLN: 4.0000 mg | Freq: Four times a day (QID) | INTRAMUSCULAR | Status: DC | PRN

## 2018-12-27 MED ORDER — ASPIRIN 300 MG RE SUPP
300.0000 mg | Freq: Once | RECTAL | Status: AC
  Administered 2018-12-27: 300 mg via RECTAL
  Filled 2018-12-27: qty 1

## 2018-12-27 MED ORDER — SODIUM CHLORIDE 0.9 % IV BOLUS
500.0000 mL | Freq: Once | INTRAVENOUS | Status: AC
  Administered 2018-12-27: 500 mL via INTRAVENOUS

## 2018-12-27 MED ORDER — PHENYLEPHRINE HCL-NACL 10-0.9 MG/250ML-% IV SOLN: 25.0000 ug/min | INTRAVENOUS | Status: DC

## 2018-12-27 MED ORDER — FENTANYL CITRATE (PF) 100 MCG/2ML IJ SOLN: 25.0000 ug | INTRAMUSCULAR | Status: DC | PRN

## 2018-12-27 NOTE — ED Triage Notes (Signed)
Arrives EMS after son found on floor. L side weakness. Able to say name but speech slurred. L leg noticeably shortened.

## 2018-12-27 NOTE — Consult Note (Signed)
Referring Physician: Joni Fears    Chief Complaint: Left sided weakness  HPI: Alexis Haas is an 85 y.o. female with a history of CAD, HTN and HLD (on ASA) who lives at home alone able to provide all ADL's was found on the floor by son this morning.  Patient was visited by her son on yesterday.  She was at baseline when he left at 1800.  This morning found on the floor with left sided weakness.  Patient reports that she started to feel bad some time on yesterday.  Initial NIHSS of 6.  Date last known well: Date: 12/26/2018 Time last known well: Time: 18:00 tPA Given: No: Outside time window  mRankin:0  Past Medical History:  Diagnosis Date  . Anemia   . Arthritis   . Cancer ([place]) 04/15/2016   T3, N1a adenocarcinoma of the hepatic flexure. No mismatch repair gene abnormality.   . Chronic kidney disease    LABS 3/18 COMPARED WITH 1 YEAR AGO  . Coronary artery disease   . Fall 2018  . GERD (gastroesophageal reflux disease)   . Heart disease   . Hypercholesteremia   . Hypertension   . Insomnia     Past Surgical History:  Procedure Laterality Date  . CARDIAC SURGERY  2005   bypass  . COLONOSCOPY WITH PROPOFOL N/A 04/15/2016   Procedure: COLONOSCOPY WITH PROPOFOL;  Surgeon: Lucilla Lame, MD;  Location: [facility] END[facility]COPY;  Service: Endoscopy;  Laterality: N/A;  . CORONARY ARTERY BYPASS GRAFT  2005   x 2  . ESOPHAGOGASTRODUODENOSCOPY (EGD) WITH PROPOFOL N/A 04/15/2016   Procedure: ESOPHAGOGASTRODUODENOSCOPY (EGD) WITH PROPOFOL;  Surgeon: Lucilla Lame, MD;  Location: [facility] ENDOSCOPY;  Service: Endoscopy;  Laterality: N/A;  . LAPAROSCOPIC RIGHT COLECTOMY Right 06/05/2016   Procedure: LAPAROSCOPIC RIGHT COLECTOMY;  Surgeon: Robert Bellow, MD;  Location: [facility] ORS;  Service: General;  Laterality: Right;  . toe removal    . TONSILLECTOMY     age 22  . UMBILICAL HERNIA REPAIR  06/05/2016   Procedure: HERNIA REPAIR UMBILICAL ADULT;  Surgeon: Robert Bellow, MD;  Location: [facility] ORS;  Service:  General;;    Family history: 2 sons alive and well  Social History:  reports that she has never smoked. She has never used smokeless tobacco. She reports that she does not drink alcohol or use drugs.  Allergies:  Allergies  Allergen Reactions  . Amoxicillin Rash    Has patient had a PCN reaction causing immediate rash, facial/tongue/throat swelling, SOB or lightheadedness with hypotension:Yes Has patient had a PCN reaction causing severe rash involving mucus membranes or skin necrosis:unsure Has patient had a PCN reaction that required hospitalization:No Has patient had a PCN reaction occurring within the last 10 years:No If all of the above answers are "NO", then may proceed with Cephalosporin use.     Medications: I have reviewed the patient's current medications. Prior to Admission:  Prior to Admission medications   Medication Sig Start Date End Date Taking? Authorizing Provider  aspirin EC 81 MG tablet Take 81 mg by mouth every 4 (four) hours as needed.    Yes [provider]  ferrous sulfate 325 (65 FE) MG tablet Take 325 mg by mouth daily with breakfast.   Yes [provider]  metoprolol tartrate (LOPRESSOR) 25 MG tablet Take 25 mg by mouth daily.    Yes [provider]  naproxen sodium (ANAPROX) 220 MG tablet Take 220 mg by mouth daily as needed (for pain).    Yes [provider]  omeprazole (PRILOSEC) 20 MG capsule Take 20 mg by mouth at bedtime.    Yes [provider]  triamterene-hydrochlorothiazide (MAXZIDE-25) 37.5-25 MG tablet Take 1 tablet by mouth daily.   Yes [provider]    ROS: History obtained from the patient  General ROS: negative for - chills, fatigue, fever, night sweats, weight gain or weight loss Psychological ROS: negative for - behavioral disorder, hallucinations, memory difficulties, mood swings or suicidal ideation Ophthalmic ROS: negative for - blurry vision, double vision, eye pain or loss of  vision ENT ROS: negative for - epistaxis, nasal discharge, oral lesions, sore throat, tinnitus or vertigo Allergy and Immunology ROS: negative for - hives or itchy/watery eyes Hematological and Lymphatic ROS: negative for - bleeding problems, bruising or swollen lymph nodes Endocrine ROS: negative for - galactorrhea, hair pattern changes, polydipsia/polyuria or temperature intolerance Respiratory ROS: negative for - cough, hemoptysis, shortness of breath or wheezing Cardiovascular ROS: negative for - chest pain, dyspnea on exertion, edema or irregular heartbeat Gastrointestinal ROS: negative for - abdominal pain, diarrhea, hematemesis, nausea/vomiting or stool incontinence Genito-Urinary ROS: negative for - dysuria, hematuria, incontinence or urinary frequency/urgency Musculoskeletal ROS: negative for - joint swelling or muscular weakness Neurological ROS: as noted in HPI Dermatological ROS: negative for rash and skin lesion changes  Physical Examination: Blood pressure (!) 160/85, pulse 97, temperature 97.7 F (36.5 C), temperature source Oral, resp. rate 20, height _0  (1.676 m), weight 77.1 kg, SpO2 100 %.  HEENT-  Normocephalic, no lesions, without obvious abnormality.  Normal external eye and conjunctiva.  Normal TM's bilaterally.  Normal auditory canals and external ears. Normal external nose, mucus membranes and septum.  Normal pharynx. Cardiovascular- S1, S2 normal, pulses palpable throughout   Lungs- Heart exam - S1, S2 normal Abdomen- soft, non-tender; bowel sounds normal; no masses,  no organomegaly Extremities- no edema Lymph-no adenopathy palpable Musculoskeletal-no joint tenderness, LLE shorter than RLE Skin-warm and dry, no hyperpigmentation, vitiligo, or suspicious lesions  Neurological Examination   Mental Status: Lethargic, oriented to name and age.  Dysarthric.  Speech fluent without evidence of aphasia.  Able to follow simple commands without difficulty but requires  extensive reinforcement for 3-step commands.  Left neglect Cranial Nerves: II: Discs flat bilaterally; Visual fields grossly normal, pupils equal, round, reactive to light and accommodation III,IV, VI: ptosis not present, extra-ocular motions intact bilaterally V,VII: smile symmetric, facial light touch sensation normal bilaterally VIII: hearing normal bilaterally IX,X: gag reflex present XI: bilateral shoulder shrug XII: midline tongue extension Motor: Right : Upper extremity   5/5    Left:     Upper extremity   4/5  Lower extremity   5/5     Lower extremity   4/5 Tone and bulk:normal tone throughout; no atrophy noted Sensory: Pinprick and light touch intact throughout, bilaterally Deep Tendon Reflexes: Symmetric throughout Plantars: Right: mute   Left: mute Cerebellar: Normal finger-to-nose testing with the RUE.  Unable to perform with the left due to neglect.  Unable to follow commands to be able to perform heel-to-shin test Gait: not tested due to safety concerns   Laboratory Studies:  Basic Metabolic Panel: Recent Labs  Lab 12/27/18 0943  NA 136  K 3.8  CL 99  CO2 22  GLUCOSE 140*  BUN 31*  CREATININE 1.25*  CALCIUM 9.5    Liver Function Tests: Recent Labs  Lab 12/27/18 0943  AST 33  ALT 17  ALKPHOS 55  BILITOT 1.0  PROT 7.6  ALBUMIN  4.4   No results for input(s): LIPASE, AMYLASE in the last 168 hours. No results for input(s): AMMONIA in the last 168 hours.  CBC: Recent Labs  Lab 12/27/18 0943  WBC 12.2*  NEUTROABS 10.5*  HGB 15.6*  HCT 45.2  MCV 85.0  PLT 211    Cardiac Enzymes: No results for input(s): CKTOTAL, CKMB, CKMBINDEX, TROPONINI in the last 168 hours.  BNP: Invalid input(s): POCBNP  CBG: No results for input(s): GLUCAP in the last 168 hours.  Microbiology: Results for orders placed or performed during the hospital encounter of 05/27/16  Surgical pcr screen     Status: None   Collection Time: 05/27/16  2:11 PM   Specimen:  Nasal Mucosa; Nasal Swab  Result Value Ref Range Status   MRSA, PCR NEGATIVE NEGATIVE Final   Staphylococcus aureus NEGATIVE NEGATIVE Final    Comment:        The Xpert SA Assay (FDA approved for NASAL specimens in patients over 55 years of age), is one component of a comprehensive surveillance program.  Test performance has been validated by [facility] for patients greater than or equal to 77 year old. It is not intended to diagnose infection nor to guide or monitor treatment.     Coagulation Studies: Recent Labs    12/27/18 0943  LABPROT 12.5  INR 0.9    Urinalysis:  Recent Labs  Lab 12/27/18 1023  COLORURINE YELLOW*  LABSPEC 1.014  PHURINE 7.0  GLUCOSEU NEGATIVE  HGBUR NEGATIVE  BILIRUBINUR NEGATIVE  KETONESUR NEGATIVE  PROTEINUR 30*  NITRITE NEGATIVE  LEUKOCYTESUR TRACE*    Lipid Panel: No results found for: CHOL, TRIG, HDL, CHOLHDL, VLDL, LDLCALC  HgbA1C: No results found for: HGBA1C  Urine Drug Screen:      Component Value Date/Time   LABOPIA NONE DETECTED 12/27/2018 1023   COCAINSCRNUR NONE DETECTED 12/27/2018 1023   LABBENZ NONE DETECTED 12/27/2018 1023   AMPHETMU NONE DETECTED 12/27/2018 1023   THCU NONE DETECTED 12/27/2018 1023   LABBARB NONE DETECTED 12/27/2018 1023    Alcohol Level:  Recent Labs  Lab 12/27/18 0943  ETH <10    Other results: EKG: sinus rhythm at 65 bpm with marked sinus arhythmia.  Imaging: Ct Angio Head W Or Wo Contrast  Result Date: 12/27/2018 CLINICAL DATA:  Stroke, follow-up. Slurred speech, left-sided weakness. EXAM: CT ANGIOGRAPHY HEAD AND NECK CT PERFUSION BRAIN TECHNIQUE: Multidetector CT imaging of the head and neck was performed using the standard protocol during bolus administration of intravenous contrast. Multiplanar CT image reconstructions and MIPs were obtained to evaluate the vascular anatomy. Carotid stenosis measurements (when applicable) are obtained utilizing NASCET criteria, using the distal  internal carotid diameter as the denominator. Multiphase CT imaging of the brain was performed following IV bolus contrast injection. Subsequent parametric perfusion maps were calculated using RAPID software. CONTRAST:  129m OMNIPAQUE IOHEXOL 350 MG/ML SOLN COMPARISON:  None. Noncontrast head CT performed earlier the same day 12/27/2018 head CT 03/12/2006 FINDINGS: CT HEAD FINDINGS Brain: No evidence of acute intracranial hemorrhage. There is abnormal hypodensity within the right insula consistent with acute infarction change (series 12, image 15). No evidence of intracranial mass. No midline shift or extra-axial fluid collection. Ill-defined hypoattenuation within the cerebral white matter consistent with chronic small vessel ischemic disease. Chronic infarcts within the bilateral cerebellum. Mild generalized parenchymal atrophy. Vascular: Reported separately Skull: Normal. Negative for fracture or focal lesion. Sinuses/Orbits: Visualized orbits demonstrate no acute abnormality. No significant paranasal sinus disease or mastoid effusion ASPECTS ([place]  Stroke Program Early CT Score) - Ganglionic level infarction (caudate, lentiform nuclei, internal capsule, insula, M1-M3 cortex): 6 - Supraganglionic infarction (M4-M6 cortex): 3 Total score (0-10 with 10 being normal): 9 Review of the MIP images confirms the above findings CTA NECK FINDINGS Aortic arch: Standard branching. Scattered soft and calcified plaque within the visualized aortic arch and proximal major branch vessels of the neck. Right carotid system: CCA and ICA widely patent within the neck without measurable stenosis. Left carotid system: CCA and ICA widely patent within the neck without measurable stenosis. Vertebral arteries: Cervical vertebral arteries patent bilaterally without significant stenosis (50% or greater). Skeleton: No acute bony abnormality. Cervical spondylosis with multilevel posterior disc osteophytes, uncovertebral and facet  hypertrophy. Severe C5-C6 intervertebral disc height loss with possible bony ankylosis. Other neck: No soft tissue neck mass or pathologically enlarged cervical chain lymph nodes. Thyroid negative. Upper chest: No consolidation within the imaged lung apices. Review of the MIP images confirms the above findings CTA HEAD FINDINGS Anterior circulation: The intracranial internal carotid arteries are patent bilaterally. Calcified plaque within the cavernous/paraclinoid segments with regions of mild stenosis. 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 The M1 right middle cerebral artery is patent without significant stenosis. There is segmental occlusion of a proximal M2 middle cerebral artery branch (series 6, images 212-214). There is some reconstitution of flow more distally within this vessel. The bilateral anterior cerebral arteries and left middle cerebral artery are patent without significant proximal stenosis. Posterior circulation: The intracranial vertebral arteries are patent. Calcified plaque within the intracranial left vertebral artery with focal mild/moderate stenosis. The basilar artery is patent without significant stenosis. The bilateral posterior cerebral arteries are patent. Moderate focal stenosis within the P2 right posterior cerebral artery (series 9, image 42). Posterior communicating arteries are present bilaterally. Venous sinuses: Within limitations of contrast timing, no convincing thrombus. Review of the MIP images confirms the above findings CT Brain Perfusion Findings: ASPECTS: 9 CBF (<30%) Volume: 0 mL identified Perfusion (Tmax>6.0s) volume: 60 mL (predominantly within the right MCA vascular territory). Mismatch Volume: 60 mL Infarction Location:Right MCA vascular territory. These results were called by telephone at the time of interpretation on 12/27/2018 at 11:49 am to provider Dr. Joni Fears, who verbally  acknowledged these results. IMPRESSION: CT head: 1. Abnormal hypodensity within the right insula consistent with acute infarction change. ASPECTS 9. 2. Generalized parenchymal atrophy with chronic small vessel ischemic disease. Chronic infarcts within the bilateral cerebellum. CTA neck: Common carotid, internal carotid and vertebral arteries patent within the neck bilaterally without significant stenosis. CTA head: 1. 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 Some reconstitution of flow is seen more distally within this vessel. 2. Additional intracranial atherosclerotic stenoses, most notably as follows. Mild/moderate focal stenosis within the intracranial left vertebral artery. Moderate focal stenosis within the P2 right posterior cerebral artery. 3. 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 CT perfusion head: No core infarct is identified by the perfusion software. However, definite acute infarction changes are demonstrated within the right insula on concurrent noncontrast head CT. ASPECTS 9. 60 mL region of hypoperfused parenchyma detected predominantly within the right MCA vascular territory. Reported mismatch 60 mL. Electronically Signed   By: Kellie Simmering DO   On: 12/27/2018 11:58   Dg Chest 1 View  Result Date: 12/27/2018 CLINICAL DATA:  Weakness after fall. EXAM: CHEST  1 VIEW COMPARISON:  None. FINDINGS: Mild cardiomegaly.  Status post coronary bypass graft. No pneumothorax or pleural effusion is noted. Both lungs are clear. The visualized skeletal structures are unremarkable. IMPRESSION: No active disease. Electronically Signed   By: Marijo Conception M.D.   On: 12/27/2018 12:05   Ct Head Wo Contrast  Result Date: 12/27/2018 CLINICAL DATA:  Left-sided weakness. Slurred speech. Questionable unwitnessed fall EXAM: CT HEAD WITHOUT CONTRAST TECHNIQUE: Contiguous axial images were obtained from the base of the skull through the vertex without  intravenous contrast. COMPARISON:  March 12, 2006 FINDINGS: Brain: There is mild diffuse atrophy. There is no intracranial mass, hemorrhage, extra-axial fluid collection, or midline shift. There is evidence of a prior focal infarct in the superior right cerebellum compared to the prior study. There is patchy small vessel disease in the centra semiovale bilaterally. No acute appearing infarct is demonstrable on this study. Vascular: No hyperdense vessel. There are foci of calcification in the distal left vertebral artery and in the carotid siphon regions bilaterally. Skull: The bony calvarium appears intact. Sinuses/Orbits: There is mucosal thickening in several ethmoid air cells. Other visualized paranasal sinuses are clear. Orbits appear symmetric bilaterally. Other: Visualized mastoid air cells are clear. IMPRESSION: Atrophy with periventricular small vessel disease. Prior small infarct in the superior right cerebellum. No acute infarct evident. No mass or hemorrhage. There are foci of arterial vascular calcification. There is mucosal thickening in several ethmoid air cells. Electronically Signed   By: Lowella Grip III M.D.   On: 12/27/2018 10:06   Ct Angio Neck W Or Wo Contrast  Result Date: 12/27/2018 CLINICAL DATA:  Stroke, follow-up. Slurred speech, left-sided weakness. EXAM: CT ANGIOGRAPHY HEAD AND NECK CT PERFUSION BRAIN TECHNIQUE: Multidetector CT imaging of the head and neck was performed using the standard protocol during bolus administration of intravenous contrast. Multiplanar CT image reconstructions and MIPs were obtained to evaluate the vascular anatomy. Carotid stenosis measurements (when applicable) are obtained utilizing NASCET criteria, using the distal internal carotid diameter as the denominator. Multiphase CT imaging of the brain was performed following IV bolus contrast injection. Subsequent parametric perfusion maps were calculated using RAPID software. CONTRAST:  153m OMNIPAQUE  IOHEXOL 350 MG/ML SOLN COMPARISON:  None. Noncontrast head CT performed earlier the same day 12/27/2018 head CT 03/12/2006 FINDINGS: CT HEAD FINDINGS Brain: No evidence of acute intracranial hemorrhage. There is abnormal hypodensity within the right insula consistent with acute infarction change (series 12, image 15). No evidence of intracranial mass. No midline shift or extra-axial fluid collection. Ill-defined hypoattenuation within the cerebral white matter consistent with chronic small vessel ischemic disease. Chronic infarcts within the bilateral cerebellum. Mild generalized parenchymal atrophy. Vascular: Reported separately Skull: Normal. Negative for fracture or focal lesion. Sinuses/Orbits: Visualized orbits demonstrate no acute abnormality. No significant paranasal sinus disease or mastoid effusion ASPECTS ([place] Queen[place]roke Program Early CT Score) - Ganglionic level infarction (caudate, lentiform nuclei, internal capsule, insula, M1-M3 cortex): 6 - Supraganglionic infarction (M4-M6 cortex): 3 Total score (0-10 with 10 being normal): 9 Review of the MIP images confirms the above findings CTA NECK FINDINGS Aortic arch: Standard branching. Scattered soft and calcified plaque within the visualized aortic arch and proximal major branch vessels of the neck. Right carotid system: CCA and ICA widely patent within the neck without measurable stenosis. Left carotid system: CCA and ICA widely patent within the neck without measurable stenosis. Vertebral arteries: Cervical vertebral arteries patent bilaterally without significant stenosis (50% or greater). Skeleton: No acute bony abnormality. Cervical spondylosis with multilevel posterior disc osteophytes, uncovertebral and facet  hypertrophy. Severe C5-C6 intervertebral disc height loss with possible bony ankylosis. Other neck: No soft tissue neck mass or pathologically enlarged cervical chain lymph nodes. Thyroid negative. Upper chest: No consolidation within the imaged  lung apices. Review of the MIP images confirms the above findings CTA HEAD FINDINGS Anterior circulation: The intracranial internal carotid arteries are patent bilaterally. Calcified plaque within the cavernous/paraclinoid segments with regions of mild stenosis. 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 The M1 right middle cerebral artery is patent without significant stenosis. There is segmental occlusion of a proximal M2 middle cerebral artery branch (series 6, images 212-214). There is some reconstitution of flow more distally within this vessel. The bilateral anterior cerebral arteries and left middle cerebral artery are patent without significant proximal stenosis. Posterior circulation: The intracranial vertebral arteries are patent. Calcified plaque within the intracranial left vertebral artery with focal mild/moderate stenosis. The basilar artery is patent without significant stenosis. The bilateral posterior cerebral arteries are patent. Moderate focal stenosis within the P2 right posterior cerebral artery (series 9, image 42). Posterior communicating arteries are present bilaterally. Venous sinuses: Within limitations of contrast timing, no convincing thrombus. Review of the MIP images confirms the above findings CT Brain Perfusion Findings: ASPECTS: 9 CBF (<30%) Volume: 0 mL identified Perfusion (Tmax>6.0s) volume: 60 mL (predominantly within the right MCA vascular territory). Mismatch Volume: 60 mL Infarction Location:Right MCA vascular territory. These results were called by telephone at the time of interpretation on 12/27/2018 at 11:49 am to provider Dr. Joni Fears, who verbally acknowledged these results. IMPRESSION: CT head: 1. Abnormal hypodensity within the right insula consistent with acute infarction change. ASPECTS 9. 2. Generalized parenchymal atrophy with chronic small vessel ischemic disease. Chronic infarcts within the  bilateral cerebellum. CTA neck: Common carotid, internal carotid and vertebral arteries patent within the neck bilaterally without significant stenosis. CTA head: 1. 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 Some reconstitution of flow is seen more distally within this vessel. 2. Additional intracranial atherosclerotic stenoses, most notably as follows. Mild/moderate focal stenosis within the intracranial left vertebral artery. Moderate focal stenosis within the P2 right posterior cerebral artery. 3. 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 CT perfusion head: No core infarct is identified by the perfusion software. However, definite acute infarction changes are demonstrated within the right insula on concurrent noncontrast head CT. ASPECTS 9. 60 mL region of hypoperfused parenchyma detected predominantly within the right MCA vascular territory. Reported mismatch 60 mL. Electronically Signed   By: Kellie Simmering DO   On: 12/27/2018 11:58   Ct Cerebral Perfusion W Contrast  Result Date: 12/27/2018 CLINICAL DATA:  Stroke, follow-up. Slurred speech, left-sided weakness. EXAM: CT ANGIOGRAPHY HEAD AND NECK CT PERFUSION BRAIN TECHNIQUE: Multidetector CT imaging of the head and neck was performed using the standard protocol during bolus administration of intravenous contrast. Multiplanar CT image reconstructions and MIPs were obtained to evaluate the vascular anatomy. Carotid stenosis measurements (when applicable) are obtained utilizing NASCET criteria, using the distal internal carotid diameter as the denominator. Multiphase CT imaging of the brain was performed following IV bolus contrast injection. Subsequent parametric perfusion maps were calculated using RAPID software. CONTRAST:  12m OMNIPAQUE IOHEXOL 350 MG/ML SOLN COMPARISON:  None. Noncontrast head CT performed earlier the same day 12/27/2018 head CT 03/12/2006 FINDINGS: CT HEAD FINDINGS Brain:  No evidence of acute intracranial hemorrhage. There is abnormal hypodensity within the right insula consistent with  acute infarction change (series 12, image 15). No evidence of intracranial mass. No midline shift or extra-axial fluid collection. Ill-defined hypoattenuation within the cerebral white matter consistent with chronic small vessel ischemic disease. Chronic infarcts within the bilateral cerebellum. Mild generalized parenchymal atrophy. Vascular: Reported separately Skull: Normal. Negative for fracture or focal lesion. Sinuses/Orbits: Visualized orbits demonstrate no acute abnormality. No significant paranasal sinus disease or mastoid effusion ASPECTS ([place] Stroke Program Early CT Score) - Ganglionic level infarction (caudate, lentiform nuclei, internal capsule, insula, M1-M3 cortex): 6 - Supraganglionic infarction (M4-M6 cortex): 3 Total score (0-10 with 10 being normal): 9 Review of the MIP images confirms the above findings CTA NECK FINDINGS Aortic arch: Standard branching. Scattered soft and calcified plaque within the visualized aortic arch and proximal major branch vessels of the neck. Right carotid system: CCA and ICA widely patent within the neck without measurable stenosis. Left carotid system: CCA and ICA widely patent within the neck without measurable stenosis. Vertebral arteries: Cervical vertebral arteries patent bilaterally without significant stenosis (50% or greater). Skeleton: No acute bony abnormality. Cervical spondylosis with multilevel posterior disc osteophytes, uncovertebral and facet hypertrophy. Severe C5-C6 intervertebral disc height loss with possible bony ankylosis. Other neck: No soft tissue neck mass or pathologically enlarged cervical chain lymph nodes. Thyroid negative. Upper chest: No consolidation within the imaged lung apices. Review of the MIP images confirms the above findings CTA HEAD FINDINGS Anterior circulation: The intracranial internal carotid arteries are  patent bilaterally. Calcified plaque within the cavernous/paraclinoid segments with regions of mild stenosis. 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 The M1 right middle cerebral artery is patent without significant stenosis. There is segmental occlusion of a proximal M2 middle cerebral artery branch (series 6, images 212-214). There is some reconstitution of flow more distally within this vessel. The bilateral anterior cerebral arteries and left middle cerebral artery are patent without significant proximal stenosis. Posterior circulation: The intracranial vertebral arteries are patent. Calcified plaque within the intracranial left vertebral artery with focal mild/moderate stenosis. The basilar artery is patent without significant stenosis. The bilateral posterior cerebral arteries are patent. Moderate focal stenosis within the P2 right posterior cerebral artery (series 9, image 42). Posterior communicating arteries are present bilaterally. Venous sinuses: Within limitations of contrast timing, no convincing thrombus. Review of the MIP images confirms the above findings CT Brain Perfusion Findings: ASPECTS: 9 CBF (<30%) Volume: 0 mL identified Perfusion (Tmax>6.0s) volume: 60 mL (predominantly within the right MCA vascular territory). Mismatch Volume: 60 mL Infarction Location:Right MCA vascular territory. These results were called by telephone at the time of interpretation on 12/27/2018 at 11:49 am to provider Dr. Joni Fears, who verbally acknowledged these results. IMPRESSION: CT head: 1. Abnormal hypodensity within the right insula consistent with acute infarction change. ASPECTS 9. 2. Generalized parenchymal atrophy with chronic small vessel ischemic disease. Chronic infarcts within the bilateral cerebellum. CTA neck: Common carotid, internal carotid and vertebral arteries patent within the neck bilaterally without significant stenosis.  CTA head: 1. 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 Some reconstitution of flow is seen more distally within this vessel. 2. Additional intracranial atherosclerotic stenoses, most notably as follows. Mild/moderate focal stenosis within the intracranial left vertebral artery. Moderate focal stenosis within the P2 right posterior cerebral artery. 3. 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 CT perfusion head: No core infarct is identified by the perfusion software. However, definite acute infarction changes are  demonstrated within the right insula on concurrent noncontrast head CT. ASPECTS 9. 60 mL region of hypoperfused parenchyma detected predominantly within the right MCA vascular territory. Reported mismatch 60 mL. Electronically Signed   By: Kellie Simmering DO   On: 12/27/2018 11:58   Dg Hip Unilat W Or Wo Pelvis 2-3 Views Left  Result Date: 12/27/2018 CLINICAL DATA:  Left hip pain after unwitnessed fall. EXAM: DG HIP (WITH OR WITHOUT PELVIS) 2-3V LEFT COMPARISON:  None. FINDINGS: There is no evidence of hip fracture or dislocation. There is no evidence of arthropathy or other focal bone abnormality. IMPRESSION: Negative. Electronically Signed   By: Marijo Conception M.D.   On: 12/27/2018 12:04    Assessment: 85 y.o. female with a history of CAD, hypertension and HLD who presents with left sided weakness and dysarthria after being found down.  Patient on ASA prior to admission.  Head CT reviewed and shows no acute changes.  Patient outside time window for tPA.  CTA shows shows a proximal right M2 occlusion with 34m cerebrum at risk on CTP.  Potential risks and benefits of intervention discussed with patient and family.  They would like to move forward with thrombectomy.  [facility]  Dr. LCheral Markeraccepted in transfer.    Stroke Risk Factors - hyperlipidemia and  hypertension  [facility]: [facility]. ASA 3027mrectally 3. Telemetry monitoring 4. Frequent neuro checks 5. Liberal BP control for the first 24-48 hours with no treatment for SBP<200.     LeAlexis GoodellMD Neurology [id number] 61821/16/2020, 12:49 PM

## 2018-12-27 NOTE — ED Notes (Signed)
Patient noted more drowsy, does awaken when spoken to and can state name. L arm slightly weaker and L leg slightly weaker. Keeps R eyelid closed but can open when asked to. Can follow commands but speech slurred.

## 2018-12-27 NOTE — ED Provider Notes (Signed)
[facility] Emergency Department Provider Note  ____________________________________________  Time seen: Approximately 10:03 AM  I have reviewed the triage vital signs and the nursing notes.   HISTORY  Chief Complaint Weakness    Level 5 Caveat: Portions of the History and Physical including HPI and review of systems are unable to be completely obtained due to patient being a poor historian   HPI Alexis Haas is a 85 y.o. female with a history of CAD, GERD, hypertension who was sent to the ED due to possible stroke.  Last known well at 6:00 PM yesterday evening when she talked to her son by phone.  This morning when he went to check on her he found her on the ground with slurred speech and left-sided weakness.  Patient denies headache, endorses left hip pain.  No known vomiting or fever or recent illness.      Past Medical History:  Diagnosis Date  . Anemia   . Arthritis   . Cancer ([place]) 04/15/2016   T3, N1a adenocarcinoma of the hepatic flexure. No mismatch repair gene abnormality.   . Chronic kidney disease    LABS 3/18 COMPARED WITH 1 YEAR AGO  . Coronary artery disease   . Fall 2018  . GERD (gastroesophageal reflux disease)   . Heart disease   . Hypercholesteremia   . Hypertension   . Insomnia      Patient Active Problem List   Diagnosis Date Noted  . Liver metastasis ([place]) 09/17/2018  . Elevated CEA 08/30/2018  . Goals of care, counseling/discussion 06/17/2017  . Hypocalcemia 03/21/2017  . Cancer of ascending colon ([place]) 06/05/2016  . Colon cancer, ascending ([place]) 05/21/2016  . Iron deficiency anemia due to chronic blood loss   . Acute gastric ulcer without hemorrhage or perforation   . Stenosis of intestine ([place])   . Weight loss 11/07/2015  . Iron deficiency anemia 01/10/2015     Past Surgical History:  Procedure Laterality Date  . CARDIAC SURGERY  2005   bypass  . COLONOSCOPY WITH PROPOFOL N/A 04/15/2016   Procedure:  COLONOSCOPY WITH PROPOFOL;  Surgeon: Lucilla Lame, MD;  Location: [facility] ENDOSCOPY;  Service: Endoscopy;  Laterality: N/A;  . CORONARY ARTERY BYPASS GRAFT  2005   x 2  . ESOPHAGOGASTRODUODENOSCOPY (EGD) WITH PROPOFOL N/A 04/15/2016   Procedure: ESOPHAGOGASTRODUODENOSCOPY (EGD) WITH PROPOFOL;  Surgeon: Lucilla Lame, MD;  Location: [facility] ENDOSCOPY;  Service: Endoscopy;  Laterality: N/A;  . LAPAROSCOPIC RIGHT COLECTOMY Right 06/05/2016   Procedure: LAPAROSCOPIC RIGHT COLECTOMY;  Surgeon: Robert Bellow, MD;  Location: [facility] ORS;  Service: General;  Laterality: Right;  . toe removal    . TONSILLECTOMY     age 70  . UMBILICAL HERNIA REPAIR  06/05/2016   Procedure: HERNIA REPAIR UMBILICAL ADULT;  Surgeon: Robert Bellow, MD;  Location: [facility] ORS;  Service: General;;     Prior to Admission medications   Medication Sig Start Date End Date Taking? Authorizing Provider  aspirin EC 81 MG tablet Take 81 mg by mouth every 4 (four) hours as needed.    Yes [provider]  ferrous sulfate 325 (65 FE) MG tablet Take 325 mg by mouth daily with breakfast.   Yes [provider]  metoprolol tartrate (LOPRESSOR) 25 MG tablet Take 25 mg by mouth daily.    Yes [provider]  naproxen sodium (ANAPROX) 220 MG tablet Take 220 mg by mouth daily as needed (for pain).    Yes [provider]  omeprazole (  PRILOSEC) 20 MG capsule Take 20 mg by mouth at bedtime.    Yes [provider]  triamterene-hydrochlorothiazide (MAXZIDE-25) 37.5-25 MG tablet Take 1 tablet by mouth daily.   Yes [provider]     Allergies Amoxicillin   No family history on file.  Social History Social History   Tobacco Use  . Smoking status: Never Smoker  . Smokeless tobacco: Never Used  Substance Use Topics  . Alcohol use: No  . Drug use: No    Review of Systems Level 5 Caveat: Portions of the History and Physical including HPI and review of systems are unable to be completely  obtained due to patient being a poor historian   Constitutional:   No known fever.  ENT:   No rhinorrhea. Cardiovascular:   No chest pain or syncope. Respiratory:   No dyspnea or cough. Gastrointestinal:   Negative for abdominal pain, vomiting and diarrhea.  Musculoskeletal:   Negative for focal pain or swelling ____________________________________________   PHYSICAL EXAM:  VITAL SIGNS: ED Triage Vitals  Enc Vitals Group     BP 12/27/18 0944 (!) 165/95     Pulse Rate 12/27/18 0944 67     Resp 12/27/18 0944 20     Temp 12/27/18 0944 97.7 F (36.5 C)     Temp Source 12/27/18 0944 Oral     SpO2 12/27/18 0944 100 %     Weight 12/27/18 0945 170 lb (77.1 kg)     Height 12/27/18 0945 '5\' 6"'  (1.676 m)     Head Circumference --      Peak Flow --      Pain Score 12/27/18 0944 0     Pain Loc --      Pain Edu? --      Excl. in [place]? --     Vital signs reviewed, nursing assessments reviewed.   Constitutional:   Alert and oriented.  Not in distress. Eyes:   Conjunctivae are normal. EOMI. PERRL. ENT      Head:   Normocephalic and atraumatic.      Nose:   No congestion/rhinnorhea.       Mouth/Throat:   MMM, no pharyngeal erythema. No peritonsillar mass.       Neck:   No meningismus. Full ROM.  No midline spinal tenderness Hematological/Lymphatic/Immunilogical:   No cervical lymphadenopathy. Cardiovascular:   RRR. Symmetric bilateral radial and DP pulses.  No murmurs. Cap refill less than 2 seconds. Respiratory:   Normal respiratory effort without tachypnea/retractions. Breath sounds are clear and equal bilaterally. No wheezes/rales/rhonchi. Gastrointestinal:   Soft and nontender. Non distended. There is no CVA tenderness.  No rebound, rigidity, or guarding.  Musculoskeletal:   Normal range of motion in all extremities. No joint effusions.  Tenderness at the left hip.  Left lower leg is shortened compared to the right.  no edema. Neurologic:   Dysarthric speech, normal  language. Pronounced weakness of the left upper extremity.  Able to dorsiflex and plantarflex left ankle, but motor exam is limited by her hip pain. NIH stroke scale equals 2 Skin:    Skin is warm, dry and intact. No rash noted.  No petechiae, purpura, or bullae.  ____________________________________________    LABS (pertinent positives/negatives) (all labs ordered are listed, but only abnormal results are displayed) Labs Reviewed  CBC - Abnormal; Notable for the following components:      Result Value   WBC 12.2 (*)    RBC 5.32 (*)    Hemoglobin 15.6 (*)  All other components within normal limits  DIFFERENTIAL - Abnormal; Notable for the following components:   Neutro Abs 10.5 (*)    All other components within normal limits  COMPREHENSIVE METABOLIC PANEL - Abnormal; Notable for the following components:   Glucose, Bld 140 (*)    BUN 31 (*)    Creatinine, Ser 1.25 (*)    GFR calc non Af Amer 36 (*)    GFR calc Af Amer 41 (*)    All other components within normal limits  URINALYSIS, COMPLETE (UACMP) WITH MICROSCOPIC - Abnormal; Notable for the following components:   Color, Urine YELLOW (*)    APPearance CLOUDY (*)    Protein, ur 30 (*)    Leukocytes,Ua TRACE (*)    Bacteria, UA FEW (*)    All other components within normal limits  SARS CORONAVIRUS 2 (TAT 6-24 HRS)  ETHANOL  PROTIME-INR  APTT  URINE DRUG SCREEN, QUALITATIVE ([facility] ONLY)   ____________________________________________   EKG  Interpreted by me Sinus rhythm rate of 81, normal axis and intervals.  Normal QRS ST segments and T waves.  ____________________________________________    RADIOLOGY  Ct Angio Head W Or Wo Contrast  Result Date: 12/27/2018 CLINICAL DATA:  Stroke, follow-up. Slurred speech, left-sided weakness. EXAM: CT ANGIOGRAPHY HEAD AND NECK CT PERFUSION BRAIN TECHNIQUE: Multidetector CT imaging of the head and neck was performed using the standard protocol during bolus administration of  intravenous contrast. Multiplanar CT image reconstructions and MIPs were obtained to evaluate the vascular anatomy. Carotid stenosis measurements (when applicable) are obtained utilizing NASCET criteria, using the distal internal carotid diameter as the denominator. Multiphase CT imaging of the brain was performed following IV bolus contrast injection. Subsequent parametric perfusion maps were calculated using RAPID software. CONTRAST:  158m OMNIPAQUE IOHEXOL 350 MG/ML SOLN COMPARISON:  None. Noncontrast head CT performed earlier the same day 12/27/2018 head CT 03/12/2006 FINDINGS: CT HEAD FINDINGS Brain: No evidence of acute intracranial hemorrhage. There is abnormal hypodensity within the right insula consistent with acute infarction change (series 12, image 15). No evidence of intracranial mass. No midline shift or extra-axial fluid collection. Ill-defined hypoattenuation within the cerebral white matter consistent with chronic small vessel ischemic disease. Chronic infarcts within the bilateral cerebellum. Mild generalized parenchymal atrophy. Vascular: Reported separately Skull: Normal. Negative for fracture or focal lesion. Sinuses/Orbits: Visualized orbits demonstrate no acute abnormality. No significant paranasal sinus disease or mastoid effusion ASPECTS ([place]ittle Fl[place]Stroke Program Early CT Score) - Ganglionic level infarction (caudate, lentiform nuclei, internal capsule, insula, M1-M3 cortex): 6 - Supraganglionic infarction (M4-M6 cortex): 3 Total score (0-10 with 10 being normal): 9 Review of the MIP images confirms the above findings CTA NECK FINDINGS Aortic arch: Standard branching. Scattered soft and calcified plaque within the visualized aortic arch and proximal major branch vessels of the neck. Right carotid system: CCA and ICA widely patent within the neck without measurable stenosis. Left carotid system: CCA and ICA widely patent within the neck without measurable stenosis. Vertebral arteries: Cervical  vertebral arteries patent bilaterally without significant stenosis (50% or greater). Skeleton: No acute bony abnormality. Cervical spondylosis with multilevel posterior disc osteophytes, uncovertebral and facet hypertrophy. Severe C5-C6 intervertebral disc height loss with possible bony ankylosis. Other neck: No soft tissue neck mass or pathologically enlarged cervical chain lymph nodes. Thyroid negative. Upper chest: No consolidation within the imaged lung apices. Review of the MIP images confirms the above findings CTA HEAD FINDINGS Anterior circulation: The intracranial internal carotid arteries are patent bilaterally. Calcified plaque within  the cavernous/paraclinoid segments with regions of mild stenosis. 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 The M1 right middle cerebral artery is patent without significant stenosis. There is segmental occlusion of a proximal M2 middle cerebral artery branch (series 6, images 212-214). There is some reconstitution of flow more distally within this vessel. The bilateral anterior cerebral arteries and left middle cerebral artery are patent without significant proximal stenosis. Posterior circulation: The intracranial vertebral arteries are patent. Calcified plaque within the intracranial left vertebral artery with focal mild/moderate stenosis. The basilar artery is patent without significant stenosis. The bilateral posterior cerebral arteries are patent. Moderate focal stenosis within the P2 right posterior cerebral artery (series 9, image 42). Posterior communicating arteries are present bilaterally. Venous sinuses: Within limitations of contrast timing, no convincing thrombus. Review of the MIP images confirms the above findings CT Brain Perfusion Findings: ASPECTS: 9 CBF (<30%) Volume: 0 mL identified Perfusion (Tmax>6.0s) volume: 60 mL (predominantly within the right MCA vascular territory).  Mismatch Volume: 60 mL Infarction Location:Right MCA vascular territory. These results were called by telephone at the time of interpretation on 12/27/2018 at 11:49 am to provider Dr. Joni Fears, who verbally acknowledged these results. IMPRESSION: CT head: 1. Abnormal hypodensity within the right insula consistent with acute infarction change. ASPECTS 9. 2. Generalized parenchymal atrophy with chronic small vessel ischemic disease. Chronic infarcts within the bilateral cerebellum. CTA neck: Common carotid, internal carotid and vertebral arteries patent within the neck bilaterally without significant stenosis. CTA head: 1. 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 Some reconstitution of flow is seen more distally within this vessel. 2. Additional intracranial atherosclerotic stenoses, most notably as follows. Mild/moderate focal stenosis within the intracranial left vertebral artery. Moderate focal stenosis within the P2 right posterior cerebral artery. 3. 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 CT perfusion head: No core infarct is identified by the perfusion software. However, definite acute infarction changes are demonstrated within the right insula on concurrent noncontrast head CT. ASPECTS 9. 60 mL region of hypoperfused parenchyma detected predominantly within the right MCA vascular territory. Reported mismatch 60 mL. Electronically Signed   By: Kellie Simmering DO   On: 12/27/2018 11:58   Dg Chest 1 View  Result Date: 12/27/2018 CLINICAL DATA:  Weakness after fall. EXAM: CHEST  1 VIEW COMPARISON:  None. FINDINGS: Mild cardiomegaly. Status post coronary bypass graft. No pneumothorax or pleural effusion is noted. Both lungs are clear. The visualized skeletal structures are unremarkable. IMPRESSION: No active disease. Electronically Signed   By: Marijo Conception M.D.   On: 12/27/2018 12:05   Ct Head Wo Contrast  Result Date: 12/27/2018 CLINICAL  DATA:  Left-sided weakness. Slurred speech. Questionable unwitnessed fall EXAM: CT HEAD WITHOUT CONTRAST TECHNIQUE: Contiguous axial images were obtained from the base of the skull through the vertex without intravenous contrast. COMPARISON:  March 12, 2006 FINDINGS: Brain: There is mild diffuse atrophy. There is no intracranial mass, hemorrhage, extra-axial fluid collection, or midline shift. There is evidence of a prior focal infarct in the superior right cerebellum compared to the prior study. There is patchy small vessel disease in the centra semiovale bilaterally. No acute appearing infarct is demonstrable on this study. Vascular: No hyperdense vessel. There are foci of calcification in the distal left vertebral artery and in the carotid siphon regions bilaterally. Skull: The bony calvarium appears intact. Sinuses/Orbits: There is mucosal thickening in several ethmoid air  cells. Other visualized paranasal sinuses are clear. Orbits appear symmetric bilaterally. Other: Visualized mastoid air cells are clear. IMPRESSION: Atrophy with periventricular small vessel disease. Prior small infarct in the superior right cerebellum. No acute infarct evident. No mass or hemorrhage. There are foci of arterial vascular calcification. There is mucosal thickening in several ethmoid air cells. Electronically Signed   By: Lowella Grip III M.D.   On: 12/27/2018 10:06   Ct Angio Neck W Or Wo Contrast  Result Date: 12/27/2018 CLINICAL DATA:  Stroke, follow-up. Slurred speech, left-sided weakness. EXAM: CT ANGIOGRAPHY HEAD AND NECK CT PERFUSION BRAIN TECHNIQUE: Multidetector CT imaging of the head and neck was performed using the standard protocol during bolus administration of intravenous contrast. Multiplanar CT image reconstructions and MIPs were obtained to evaluate the vascular anatomy. Carotid stenosis measurements (when applicable) are obtained utilizing NASCET criteria, using the distal internal carotid diameter  as the denominator. Multiphase CT imaging of the brain was performed following IV bolus contrast injection. Subsequent parametric perfusion maps were calculated using RAPID software. CONTRAST:  130m OMNIPAQUE IOHEXOL 350 MG/ML SOLN COMPARISON:  None. Noncontrast head CT performed earlier the same day 12/27/2018 head CT 03/12/2006 FINDINGS: CT HEAD FINDINGS Brain: No evidence of acute intracranial hemorrhage. There is abnormal hypodensity within the right insula consistent with acute infarction change (series 12, image 15). No evidence of intracranial mass. No midline shift or extra-axial fluid collection. Ill-defined hypoattenuation within the cerebral white matter consistent with chronic small vessel ischemic disease. Chronic infarcts within the bilateral cerebellum. Mild generalized parenchymal atrophy. Vascular: Reported separately Skull: Normal. Negative for fracture or focal lesion. Sinuses/Orbits: Visualized orbits demonstrate no acute abnormality. No significant paranasal sinus disease or mastoid effusion ASPECTS ([place]roke Program Early CT Score) - Ganglionic level infarction (caudate, lentiform nuclei, internal capsule, insula, M1-M3 cortex): 6 - Supraganglionic infarction (M4-M6 cortex): 3 Total score (0-10 with 10 being normal): 9 Review of the MIP images confirms the above findings CTA NECK FINDINGS Aortic arch: Standard branching. Scattered soft and calcified plaque within the visualized aortic arch and proximal major branch vessels of the neck. Right carotid system: CCA and ICA widely patent within the neck without measurable stenosis. Left carotid system: CCA and ICA widely patent within the neck without measurable stenosis. Vertebral arteries: Cervical vertebral arteries patent bilaterally without significant stenosis (50% or greater). Skeleton: No acute bony abnormality. Cervical spondylosis with multilevel posterior disc osteophytes, uncovertebral and facet hypertrophy. Severe C5-C6  intervertebral disc height loss with possible bony ankylosis. Other neck: No soft tissue neck mass or pathologically enlarged cervical chain lymph nodes. Thyroid negative. Upper chest: No consolidation within the imaged lung apices. Review of the MIP images confirms the above findings CTA HEAD FINDINGS Anterior circulation: The intracranial internal carotid arteries are patent bilaterally. Calcified plaque within the cavernous/paraclinoid segments with regions of mild stenosis. 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 The M1 right middle cerebral artery is patent without significant stenosis. There is segmental occlusion of a proximal M2 middle cerebral artery branch (series 6, images 212-214). There is some reconstitution of flow more distally within this vessel. The bilateral anterior cerebral arteries and left middle cerebral artery are patent without significant proximal stenosis. Posterior circulation: The intracranial vertebral arteries are patent. Calcified plaque within the intracranial left vertebral artery with focal mild/moderate stenosis. The basilar artery is patent without significant stenosis. The bilateral posterior cerebral arteries are patent. Moderate focal stenosis within the P2 right posterior  cerebral artery (series 9, image 42). Posterior communicating arteries are present bilaterally. Venous sinuses: Within limitations of contrast timing, no convincing thrombus. Review of the MIP images confirms the above findings CT Brain Perfusion Findings: ASPECTS: 9 CBF (<30%) Volume: 0 mL identified Perfusion (Tmax>6.0s) volume: 60 mL (predominantly within the right MCA vascular territory). Mismatch Volume: 60 mL Infarction Location:Right MCA vascular territory. These results were called by telephone at the time of interpretation on 12/27/2018 at 11:49 am to provider Dr. Joni Fears, who verbally acknowledged these results.  IMPRESSION: CT head: 1. Abnormal hypodensity within the right insula consistent with acute infarction change. ASPECTS 9. 2. Generalized parenchymal atrophy with chronic small vessel ischemic disease. Chronic infarcts within the bilateral cerebellum. CTA neck: Common carotid, internal carotid and vertebral arteries patent within the neck bilaterally without significant stenosis. CTA head: 1. 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 Some reconstitution of flow is seen more distally within this vessel. 2. Additional intracranial atherosclerotic stenoses, most notably as follows. Mild/moderate focal stenosis within the intracranial left vertebral artery. Moderate focal stenosis within the P2 right posterior cerebral artery. 3. 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 CT perfusion head: No core infarct is identified by the perfusion software. However, definite acute infarction changes are demonstrated within the right insula on concurrent noncontrast head CT. ASPECTS 9. 60 mL region of hypoperfused parenchyma detected predominantly within the right MCA vascular territory. Reported mismatch 60 mL. Electronically Signed   By: Kellie Simmering DO   On: 12/27/2018 11:58   Ct Cerebral Perfusion W Contrast  Result Date: 12/27/2018 CLINICAL DATA:  Stroke, follow-up. Slurred speech, left-sided weakness. EXAM: CT ANGIOGRAPHY HEAD AND NECK CT PERFUSION BRAIN TECHNIQUE: Multidetector CT imaging of the head and neck was performed using the standard protocol during bolus administration of intravenous contrast. Multiplanar CT image reconstructions and MIPs were obtained to evaluate the vascular anatomy. Carotid stenosis measurements (when applicable) are obtained utilizing NASCET criteria, using the distal internal carotid diameter as the denominator. Multiphase CT imaging of the brain was performed following IV bolus contrast injection. Subsequent parametric perfusion  maps were calculated using RAPID software. CONTRAST:  169m OMNIPAQUE IOHEXOL 350 MG/ML SOLN COMPARISON:  None. Noncontrast head CT performed earlier the same day 12/27/2018 head CT 03/12/2006 FINDINGS: CT HEAD FINDINGS Brain: No evidence of acute intracranial hemorrhage. There is abnormal hypodensity within the right insula consistent with acute infarction change (series 12, image 15). No evidence of intracranial mass. No midline shift or extra-axial fluid collection. Ill-defined hypoattenuation within the cerebral white matter consistent with chronic small vessel ischemic disease. Chronic infarcts within the bilateral cerebellum. Mild generalized parenchymal atrophy. Vascular: Reported separately Skull: Normal. Negative for fracture or focal lesion. Sinuses/Orbits: Visualized orbits demonstrate no acute abnormality. No significant paranasal sinus disease or mastoid effusion ASPECTS ([place]roke Program Early CT Score) - Ganglionic level infarction (caudate, lentiform nuclei, internal capsule, insula, M1-M3 cortex): 6 - Supraganglionic infarction (M4-M6 cortex): 3 Total score (0-10 with 10 being normal): 9 Review of the MIP images confirms the above findings CTA NECK FINDINGS Aortic arch: Standard branching. Scattered soft and calcified plaque within the visualized aortic arch and proximal major branch vessels of the neck. Right carotid system: CCA and ICA widely patent within the neck without measurable stenosis. Left carotid system: CCA and ICA widely patent within the neck without measurable stenosis. Vertebral arteries: Cervical vertebral arteries patent bilaterally without significant stenosis (50% or greater). Skeleton: No acute bony abnormality. Cervical  spondylosis with multilevel posterior disc osteophytes, uncovertebral and facet hypertrophy. Severe C5-C6 intervertebral disc height loss with possible bony ankylosis. Other neck: No soft tissue neck mass or pathologically enlarged cervical chain lymph  nodes. Thyroid negative. Upper chest: No consolidation within the imaged lung apices. Review of the MIP images confirms the above findings CTA HEAD FINDINGS Anterior circulation: The intracranial internal carotid arteries are patent bilaterally. Calcified plaque within the cavernous/paraclinoid segments with regions of mild stenosis. 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 The M1 right middle cerebral artery is patent without significant stenosis. There is segmental occlusion of a proximal M2 middle cerebral artery branch (series 6, images 212-214). There is some reconstitution of flow more distally within this vessel. The bilateral anterior cerebral arteries and left middle cerebral artery are patent without significant proximal stenosis. Posterior circulation: The intracranial vertebral arteries are patent. Calcified plaque within the intracranial left vertebral artery with focal mild/moderate stenosis. The basilar artery is patent without significant stenosis. The bilateral posterior cerebral arteries are patent. Moderate focal stenosis within the P2 right posterior cerebral artery (series 9, image 42). Posterior communicating arteries are present bilaterally. Venous sinuses: Within limitations of contrast timing, no convincing thrombus. Review of the MIP images confirms the above findings CT Brain Perfusion Findings: ASPECTS: 9 CBF (<30%) Volume: 0 mL identified Perfusion (Tmax>6.0s) volume: 60 mL (predominantly within the right MCA vascular territory). Mismatch Volume: 60 mL Infarction Location:Right MCA vascular territory. These results were called by telephone at the time of interpretation on 12/27/2018 at 11:49 am to provider Dr. Joni Fears, who verbally acknowledged these results. IMPRESSION: CT head: 1. Abnormal hypodensity within the right insula consistent with acute infarction change. ASPECTS 9. 2. Generalized parenchymal atrophy  with chronic small vessel ischemic disease. Chronic infarcts within the bilateral cerebellum. CTA neck: Common carotid, internal carotid and vertebral arteries patent within the neck bilaterally without significant stenosis. CTA head: 1. 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 Some reconstitution of flow is seen more distally within this vessel. 2. Additional intracranial atherosclerotic stenoses, most notably as follows. Mild/moderate focal stenosis within the intracranial left vertebral artery. Moderate focal stenosis within the P2 right posterior cerebral artery. 3. 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 CT perfusion head: No core infarct is identified by the perfusion software. However, definite acute infarction changes are demonstrated within the right insula on concurrent noncontrast head CT. ASPECTS 9. 60 mL region of hypoperfused parenchyma detected predominantly within the right MCA vascular territory. Reported mismatch 60 mL. Electronically Signed   By: Kellie Simmering DO   On: 12/27/2018 11:58   Dg Hip Unilat W Or Wo Pelvis 2-3 Views Left  Result Date: 12/27/2018 CLINICAL DATA:  Left hip pain after unwitnessed fall. EXAM: DG HIP (WITH OR WITHOUT PELVIS) 2-3V LEFT COMPARISON:  None. FINDINGS: There is no evidence of hip fracture or dislocation. There is no evidence of arthropathy or other focal bone abnormality. IMPRESSION: Negative. Electronically Signed   By: Marijo Conception M.D.   On: 12/27/2018 12:04    ____________________________________________   PROCEDURES .Critical Care Performed by: Carrie Mew, MD Authorized by: Carrie Mew, MD   Critical care provider statement:    Critical care time (minutes):  35   Critical care time was exclusive of:  Separately billable procedures and treating other patients   Critical care was necessary to treat or prevent imminent or life-threatening deterioration of  the  following conditions:  CNS failure or compromise   Critical care was time spent personally by me on the following activities:  Development of treatment plan with patient or surrogate, discussions with consultants, evaluation of patient's response to treatment, examination of patient, obtaining history from patient or surrogate, ordering and performing treatments and interventions, ordering and review of laboratory studies, ordering and review of radiographic studies, pulse oximetry, re-evaluation of patient's condition and review of old charts    ____________________________________________  DIFFERENTIAL DIAGNOSIS   Intracranial hemorrhage, acute ischemic stroke, left hip fracture  CLINICAL IMPRESSION / ASSESSMENT AND PLAN / ED COURSE  Medications ordered in the ED: Medications  aspirin suppository 300 mg (300 mg Rectal Given 12/27/18 1045)  sodium chloride 0.9 % bolus 500 mL (0 mLs Intravenous Stopped 12/27/18 1224)  iohexol (OMNIPAQUE) 350 MG/ML injection 100 mL (100 mLs Intravenous Contrast Given 12/27/18 1057)    Pertinent labs & imaging results that were available during my care of the patient were reviewed by me and considered in my medical decision making (see chart for details).   Alexis Haas was evaluated in Emergency Department on 12/27/2018 for the symptoms described in the history of present illness. She was evaluated in the context of the global COVID-19 pandemic, which necessitated consideration that the patient might be at risk for infection with the SARS-CoV-2 virus that causes COVID-19. Institutional protocols and algorithms that pertain to the evaluation of patients at risk for COVID-19 are in a state of rapid change based on information released by regulatory bodies including the CDC and federal and state organizations. These policies and algorithms were followed during the patient's care in the ED.   Patient presents with dysarthria and left-sided weakness, onset  within the last 16 hours.  Outside of window of TPA, especially given age and possibility of head trauma.  Will obtain CT scan of the head, lab panel, x-ray left hip, neurology consult.  We will plan to admit.  Clinical Course as of Dec 27 1307  Mon Dec 27, 2018  1026 Discussed with neurology Dr. Doy Mince after her evaluation.  Plans to proceed with CTA/CTP.  Will give rectal aspirin and gentle fluids for renal protection with IV contrast   [PS]  1152 Discussed with radiology who notes a proximal right M2 arterial occlusion on CT angiogram with a 60 mL ischemic penumbra.  Discussed with Dr. Doy Mince who is already aware and will contact [facility] interventionist   [PS]  1221 X-rays of the chest and left hip are unremarkable   [PS]    Clinical Course User Index [PS] Carrie Mew, MD    ----------------------------------------- 1:08 PM on 12/27/2018 -----------------------------------------  Dr. Doy Mince has spoken with [facility] and Dr. Cheral Marker at [facility] and arrange for transfer for endovascular management.  Awaiting transport.   ____________________________________________   FINAL CLINICAL IMPRESSION(S) / ED DIAGNOSES    Final diagnoses:  Acute ischemic right MCA stroke [facility])     ED Discharge Orders    None      Portions of this note were generated with dragon dictation software. Dictation errors may occur despite best attempts at proofreading.   Carrie Mew, MD 12/27/18 1309

## 2018-12-27 NOTE — Anesthesia Postprocedure Evaluation (Signed)
Anesthesia Post Note  Patient: VALISHA MAZZUCA  Procedure(s) Performed: CODE STROKE (N/A )     Patient location during evaluation: SICU Anesthesia Type: General Level of consciousness: sedated Pain management: pain level controlled Vital Signs Assessment: post-procedure vital signs reviewed and stable Respiratory status: patient remains intubated per anesthesia plan Cardiovascular status: stable Postop Assessment: no apparent nausea or vomiting Anesthetic complications: no    Last Vitals:  Vitals:   12/27/18 1730 12/27/18 1745  BP: (!) 92/50 (!) 93/53  Pulse: 80 79  Resp: 18 18  SpO2: 100% 100%    Last Pain:  Vitals:   12/27/18 1358  TempSrc: Oral                 Effie Berkshire

## 2018-12-27 NOTE — ED Triage Notes (Signed)
Pt in as transfer from [facility], via [place]. Code stroke with LSN 6pm, son found pt down on floor this am at 0930. Slurred speech and L side weakness noted. Transfer for IR thrombectomy by Dr. Garen Grams

## 2018-12-27 NOTE — Anesthesia Procedure Notes (Signed)
Arterial Line Insertion Start/End11/16/2020 2:55 PM, 12/27/2018 3:00 PM Performed by: Shirlyn Goltz, CRNA  Patient location: OR. Emergency situation Left, radial was placed Catheter size: 20 G Hand hygiene performed , maximum sterile barriers used  and Seldinger technique used Allen's test indicative of satisfactory collateral circulation Attempts: 2 Procedure performed without using ultrasound guided technique. Following insertion, dressing applied and Biopatch. Post procedure assessment: normal  Post procedure complications: local hematoma.

## 2018-12-27 NOTE — Anesthesia Procedure Notes (Signed)
Procedure Name: Intubation Date/Time: 12/27/2018 2:50 PM Performed by: Shirlyn Goltz, CRNA Pre-anesthesia Checklist: Patient identified, Emergency Drugs available, Suction available and Patient being monitored Patient Re-evaluated:Patient Re-evaluated prior to induction Oxygen Delivery Method: Circle system utilized Preoxygenation: Pre-oxygenation with 100% oxygen Induction Type: IV induction and Rapid sequence Laryngoscope Size: Glidescope and 3 (covid precautions) Grade View: Grade I Tube type: Oral Tube size: 7.0 mm Number of attempts: 1 Airway Equipment and Method: Stylet and Video-laryngoscopy Placement Confirmation: ETT inserted through vocal cords under direct vision,  CO2 detector and breath sounds checked- equal and bilateral Secured at: 23 cm Tube secured with: Tape Dental Injury: Teeth and Oropharynx as per pre-operative assessment

## 2018-12-27 NOTE — Progress Notes (Signed)
Patient jewelry (2 yellow metal rings with clear stones) given to patient daughter by Roe Coombs.

## 2018-12-27 NOTE — Anesthesia Preprocedure Evaluation (Signed)
Anesthesia Evaluation  Patient identified by MRN, date of birth, ID band Patient unresponsive  Preop documentation limited or incomplete due to emergent nature of procedure.  Airway        Dental   Pulmonary           Cardiovascular hypertension, + CAD       Neuro/Psych    GI/Hepatic PUD, GERD  ,  Endo/Other    Renal/GU Renal disease     Musculoskeletal  (+) Arthritis ,   Abdominal   Peds  Hematology  (+) anemia ,   Anesthesia Other Findings   Reproductive/Obstetrics                             Anesthesia Physical Anesthesia Plan  ASA: IV  Anesthesia Plan: General   Post-op Pain Management:    Induction: Intravenous  PONV Risk Score and Plan: 3 and Ondansetron and Dexamethasone  Airway Management Planned: Oral ETT  Additional Equipment:   Intra-op Plan:   Post-operative Plan: Possible Post-op intubation/ventilation  Informed Consent: I have reviewed the patients History and Physical, chart, labs and discussed the procedure including the risks, benefits and alternatives for the proposed anesthesia with the patient or authorized representative who has indicated his/her understanding and acceptance.     Dental advisory given  Plan Discussed with: CRNA and Anesthesiologist  Anesthesia Plan Comments:         Anesthesia Quick Evaluation

## 2018-12-27 NOTE — Procedures (Signed)
S/P RT common carotid arteriogram followed by partial revacularization of occluded RT MCA dominant inf division with x 2 passes with solitaire 41mm x 40 mm x retiever device achieving a TICI 2b revascularization. S.Afsana Liera MD

## 2018-12-27 NOTE — ED Notes (Signed)
CODE STROKE CALLED TO 333 

## 2018-12-27 NOTE — Consult Note (Addendum)
NAME:  Alexis Haas, MRN:  [id number], DOB:  March 14, 1917, LOS: 0 ADMISSION DATE:  12/27/2018, CONSULTATION DATE:  12/27/2018 REFERRING MD:  Dr. Estanislado Pandy, CHIEF COMPLAINT:  resp insufficiency   Brief History   29 yoF originally presenting to [facility] with left sided weakness and AMS found to have acute right M2 occlusion. LSW 11/15 around 1800.  Transferred to [facility] for endovascular repair with partial revascularization of R MCA.  COVID pending, therefore returns to ICU on mechanical ventilation.  History of present illness   HPI obtained from medical chart review as patient is sedated and intubated.   85 year old female with history of CAD, HTN, HLD with LSW 1800 on 12/26/2018.  Found on the floor this morning by her son with altered mental status and left sided weakness.  Presented to [facility] with NIHSS 6, found to have left weakness, left neglect, and visual deficit.  Found on CTA to have acute right MCA occlusion.   Outside of window for tPA.  Transferred to [facility] for endovascular repair.  Underwent EVR with partial revascularization of right MCA.  COVID test still pending, therefore patient to remain intubated and return to ICU on mechanical ventilation. PCCM consulted for vent management.    Past Medical History  CAD s/p CABG 2005, HTN, HLD, anemia, arthritis, CKD, GERD  Significant Hospital Events   11/16 tx [facility] to [facility]/ admitted / EVR  Consults:  NIR   Procedures:  11/16 EVR >> RT common carotid arteriogram followed by partial revacularization of occluded RT MCA dominant inf division with x 2 passes achieving a TICI 2b revascularization  11/16 ETT >> 11/16 Aline >>  Significant Diagnostic Tests:  11/16 CTH >> Atrophy with periventricular small vessel disease. Prior small infarct in the superior right cerebellum. No acute infarct evident.  No mass or hemorrhage.  There are foci of arterial vascular calcification. There is mucosal thickening in several ethmoid air cells.  11/16 CTA  head>>  1. 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 Some reconstitution of flow is seen more distally within this vessel. 2. Additional intracranial atherosclerotic stenoses, most notably as follows. Mild/moderate focal stenosis within the intracranial left vertebral artery. Moderate focal stenosis within the P2 right posterior cerebral artery. 3. 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  11/16 CT Neck >> Common carotid, internal carotid and vertebral arteries patent within the neck bilaterally without significant stenosis.  11/16 CT Perfusion >> No core infarct is identified by the perfusion software. However, definite acute infarction changes are demonstrated within the right insula on concurrent noncontrast head CT. ASPECTS 9. 60 mL region of hypoperfused parenchyma detected predominantly within the right MCA vascular territory. Reported mismatch 60 mL.  Micro Data:  [facility] 11/16 SARs CoV2 >>  Antimicrobials:  11/16 pre op cefazolin   Interim history/subjective:   Objective   Blood pressure 119/65, pulse (!) 104, resp. rate 18, SpO2 99 %.    FiO2 (%):  [40 %] 40 %   Intake/Output Summary (Last 24 hours) at 12/27/2018 1711 Last data filed at 12/27/2018 1649 Gross per 24 hour  Intake 1000 ml  Output 1500 ml  Net -500 ml   There were no vitals filed for this visit.  Examination: Per Dr. Tamala Julian as patient remains under airborne/ contact precautions  Resolved Hospital Problem list    Assessment & Plan:   Acute right MCA stroke s/p EVR with partial revacularization P:  Per Neurology/ NIR Pending further  stroke workup per Neurology  SBP goal 120-140  Ongoing neuro exams  MRI brain when able    Acute respiratory insufficiency related above R/o COVID  P:  Full MV support, PRVC 8 cc/kg ABG and CXR now VAP bundle DWA/ SBT, hold extubation till COVID test pended  Continue airborne/ contact precautions PAD  protocol with prn fentanyl and precedex for RASS goal 0/-1  AKI  P:  Gentle hydration Trend BMP / urinary output Replace electrolytes as indicated Avoid nephrotoxic agents, ensure adequate renal perfusion   Hypotension Hx CAD, HTN, HLD P:  Hypotension likely related sedation Neo prn SBP goal 120-140 Hold home HTN meds/ ASA   Hyperglycemia P:  CBG q 4 Add SSI if > 180   Leukocytosis - UA with trace leukocytes, few bacteria, WBC 21-50 P:  Send urine culture Ceftriaxone for 3 days  Trend WBC/ fever curve   Best practice:  Diet: NPO Pain/Anxiety/Delirium protocol (if indicated): prn fentanyl/ precedex VAP protocol (if indicated): yes DVT prophylaxis: SCDs for now GI prophylaxis: PPI Glucose control: CBG q 4 Mobility: BR Code Status: Full  Family Communication: per primary  Disposition: 4N ICU  Labs   CBC: Recent Labs  Lab 12/27/18 0943  WBC 12.2*  NEUTROABS 10.5*  HGB 15.6*  HCT 45.2  MCV 85.0  PLT 123456    Basic Metabolic Panel: Recent Labs  Lab 12/27/18 0943  NA 136  K 3.8  CL 99  CO2 22  GLUCOSE 140*  BUN 31*  CREATININE 1.25*  CALCIUM 9.5   GFR: Estimated Creatinine Clearance: 25.7 mL/min (A) (by C-G formula based on SCr of 1.25 mg/dL (H)). Recent Labs  Lab 12/27/18 0943  WBC 12.2*    Liver Function Tests: Recent Labs  Lab 12/27/18 0943  AST 33  ALT 17  ALKPHOS 55  BILITOT 1.0  PROT 7.6  ALBUMIN 4.4   No results for input(s): LIPASE, AMYLASE in the last 168 hours. No results for input(s): AMMONIA in the last 168 hours.  ABG No results found for: PHART, PCO2ART, PO2ART, HCO3, TCO2, ACIDBASEDEF, O2SAT   Coagulation Profile: Recent Labs  Lab 12/27/18 0943  INR 0.9    Cardiac Enzymes: No results for input(s): CKTOTAL, CKMB, CKMBINDEX, TROPONINI in the last 168 hours.  HbA1C: No results found for: HGBA1C  CBG: No results for input(s): GLUCAP in the last 168 hours.  Review of Systems:   Unable as patient is  intubated/ sedated on MV  Past Medical History  She,  has a past medical history of Anemia, Arthritis, Cancer ([place]) (04/15/2016), Chronic kidney disease, Coronary artery disease, Fall (2018), GERD (gastroesophageal reflux disease), Heart disease, Hypercholesteremia, Hypertension, and Insomnia.   Surgical History    Past Surgical History:  Procedure Laterality Date  . CARDIAC SURGERY  2005   bypass  . COLONOSCOPY WITH PROPOFOL N/A 04/15/2016   Procedure: COLONOSCOPY WITH PROPOFOL;  Surgeon: Lucilla Lame, MD;  Location: [facility] ENDOSCOPY;  Service: Endoscopy;  Laterality: N/A;  . CORONARY ARTERY BYPASS GRAFT  2005   x 2  . ESOPHAGOGASTRODUODENOSCOPY (EGD) WITH PROPOFOL N/A 04/15/2016   Procedure: ESOPHAGOGASTRODUODENOSCOPY (EGD) WITH PROPOFOL;  Surgeon: Lucilla Lame, MD;  Location: [facility] ENDOSCOPY;  Service: Endoscopy;  Laterality: N/A;  . LAPAROSCOPIC RIGHT COLECTOMY Right 06/05/2016   Procedure: LAPAROSCOPIC RIGHT COLECTOMY;  Surgeon: Robert Bellow, MD;  Location: [facility] ORS;  Service: General;  Laterality: Right;  . toe removal    . TONSILLECTOMY     age 53  . UMBILICAL HERNIA REPAIR  06/05/2016   Procedure: HERNIA REPAIR UMBILICAL ADULT;  Surgeon: Robert Bellow, MD;  Location: [facility] ORS;  Service: General;;     Social History   reports that she has never smoked. She has never used smokeless tobacco. She reports that she does not drink alcohol or use drugs.   Family History   Her family history is not on file.   Allergies Allergies  Allergen Reactions  . Amoxicillin Rash    Has patient had a PCN reaction causing immediate rash, facial/tongue/throat swelling, SOB or lightheadedness with hypotension:Yes Has patient had a PCN reaction causing severe rash involving mucus membranes or skin necrosis:unsure Has patient had a PCN reaction that required hospitalization:No Has patient had a PCN reaction occurring within the last 10 years:No If all of the above answers are "NO", then may  proceed with Cephalosporin use.      Home Medications  Prior to Admission medications   Medication Sig Start Date End Date Taking? Authorizing Provider  aspirin EC 81 MG tablet Take 81 mg by mouth every 4 (four) hours as needed.     [provider]  ferrous sulfate 325 (65 FE) MG tablet Take 325 mg by mouth daily with breakfast.    [provider]  metoprolol tartrate (LOPRESSOR) 25 MG tablet Take 25 mg by mouth daily.     [provider]  naproxen sodium (ANAPROX) 220 MG tablet Take 220 mg by mouth daily as needed (for pain).     [provider]  omeprazole (PRILOSEC) 20 MG capsule Take 20 mg by mouth at bedtime.     [provider]  triamterene-hydrochlorothiazide (MAXZIDE-25) 37.5-25 MG tablet Take 1 tablet by mouth daily.    [provider]     Critical care time: 35 mins      Kennieth Rad, MSN, AGACNP-BC [facility] 12/27/2018, 5:50 PM

## 2018-12-27 NOTE — Progress Notes (Signed)
Patient arrived to floor at 1702. Patient placed on monitor, noted ART line BP out of parameters. No orders received for cleviprex until 1720, when medication was immediately started. Please see flow sheet for titration of medication. Lianne Bushy RN BSN.

## 2018-12-27 NOTE — ED Notes (Signed)
EMTALA reviewed. 

## 2018-12-27 NOTE — ED Notes (Signed)
FSBS 121

## 2018-12-27 NOTE — ED Notes (Signed)
Patient drops to sleep when undisturbed.

## 2018-12-27 NOTE — H&P (Signed)
Admission H&P    Chief Complaint: Acute onset of left sided weakness  HPI: Alexis Haas is an 85 y.o. female presenting acutely from the [facility] ED for right MCA stroke. The patient is unable to provide a history due to anosognosia and left sided neglect, therefore notes from [facility] were reviewed.   Per Dr. Doy Mince' note: "Alexis Haas is an 85 y.o. female with a history of CAD, HTN and HLD (on ASA) who lives at home alone able to provide all ADL's was found on the floor by son this morning.  Patient was visited by her son on yesterday.  She was at baseline when he left at 1800.  This morning found on the floor with left sided weakness.  Patient reports that she started to feel bad some time on yesterday.  Initial NIHSS of 6."  LSN: 1800 on Sunday tPA Given: No: Outside of IV tPA time window   Past Medical History:  Diagnosis Date  . Anemia   . Arthritis   . Cancer ([place]) 04/15/2016   T3, N1a adenocarcinoma of the hepatic flexure. No mismatch repair gene abnormality.   . Chronic kidney disease    LABS 3/18 COMPARED WITH 1 YEAR AGO  . Coronary artery disease   . Fall 2018  . GERD (gastroesophageal reflux disease)   . Heart disease   . Hypercholesteremia   . Hypertension   . Insomnia     Past Surgical History:  Procedure Laterality Date  . CARDIAC SURGERY  2005   bypass  . COLONOSCOPY WITH PROPOFOL N/A 04/15/2016   Procedure: COLONOSCOPY WITH PROPOFOL;  Surgeon: Lucilla Lame, MD;  Location: [facility] ENDOSCOPY;  Service: Endoscopy;  Laterality: N/A;  . CORONARY ARTERY BYPASS GRAFT  2005   x 2  . ESOPHAGOGASTRODUODENOSCOPY (EGD) WITH PROPOFOL N/A 04/15/2016   Procedure: ESOPHAGOGASTRODUODENOSCOPY (EGD) WITH PROPOFOL;  Surgeon: Lucilla Lame, MD;  Location: [facility] ENDOSCOPY;  Service: Endoscopy;  Laterality: N/A;  . LAPAROSCOPIC RIGHT COLECTOMY Right 06/05/2016   Procedure: LAPAROSCOPIC RIGHT COLECTOMY;  Surgeon: Robert Bellow, MD;  Location: [facility] ORS;  Service: General;  Laterality: Right;   . toe removal    . TONSILLECTOMY     age 62  . UMBILICAL HERNIA REPAIR  06/05/2016   Procedure: HERNIA REPAIR UMBILICAL ADULT;  Surgeon: Robert Bellow, MD;  Location: [facility] ORS;  Service: General;;    No family history on file. Social History:  reports that she has never smoked. She has never used smokeless tobacco. She reports that she does not drink alcohol or use drugs.  Allergies:  Allergies  Allergen Reactions  . Amoxicillin Rash    Has patient had a PCN reaction causing immediate rash, facial/tongue/throat swelling, SOB or lightheadedness with hypotension:Yes Has patient had a PCN reaction causing severe rash involving mucus membranes or skin necrosis:unsure Has patient had a PCN reaction that required hospitalization:No Has patient had a PCN reaction occurring within the last 10 years:No If all of the above answers are "NO", then may proceed with Cephalosporin use.     ROS: Deferred in the context of acuity of presentation.   Physical Examination: Blood pressure (!) 162/83, pulse (!) 105, resp. rate 18, SpO2 100 %.  HEENT-  [place]/AT  Lungs - Respirations unlabored Extremities - No edema  Neurologic Examination: Mental Status: Awake. Speech dysarthric and sparse without errors of grammar or syntax. Able to follow all right sided commands. Left hemineglect and anosognosia noted.  Cranial Nerves: II:  PERRL. No blink to threat  on the left.  III,IV, VI: Rightward gaze deviation. Eyes are conjugate.Cannot cross midline to the left.  V,VII: Mild left facial droop. Insensate to temperature,left side of face.  VIII: hearing intact to voice IX,X: No hypophonia XI: Head rotated to the right.  XII: Tongue deviates to the left. Motor: RUE and RLE 5/5 LUE with no volitional movement to command. Drifts slowly to bed after passive elevation and release.  LLE 4/5 with slower movement than on the right.  Sensory: Insensate to FT and temp on the left Deep Tendon Reflexes:  1+  right brachioradialis and biceps. 2+ left brachioradialis and biceps.  2+ bilateral patellae. Plantars upgoing bilaterally  Cerebellar: No ataxia with FNF on the right. Unable to perform on the left.  No ataxia with H-S on right. No ataxia disproportionate to weakness with H-S on the left.  Gait: Unable to assess  Results for orders placed or performed during the hospital encounter of 12/27/18 (from the past 48 hour(s))  Ethanol     Status: None   Collection Time: 12/27/18  9:43 AM  Result Value Ref Range   Alcohol, Ethyl (B) <10 <10 mg/dL    Comment: (NOTE) Lowest detectable limit for serum alcohol is 10 mg/dL. For medical purposes only. Performed at [facility], [address] [place], [place] [address]   Protime-INR     Status: None   Collection Time: 12/27/18  9:43 AM  Result Value Ref Range   Prothrombin Time 12.5 11.4 - 15.2 seconds   INR 0.9 0.8 - 1.2    Comment: (NOTE) INR goal varies based on device and disease states. Performed at [facility], [address] [place], [place] [address]   APTT     Status: None   Collection Time: 12/27/18  9:43 AM  Result Value Ref Range   aPTT 26 24 - 36 seconds    Comment: Performed at [facility], [address] [place], [place] [address]  CBC     Status: Abnormal   Collection Time: 12/27/18  9:43 AM  Result Value Ref Range   WBC 12.2 (H) 4.0 - 10.5 K/uL   RBC 5.32 (H) 3.87 - 5.11 MIL/uL   Hemoglobin 15.6 (H) 12.0 - 15.0 g/dL   HCT 45.2 36.0 - 46.0 %   MCV 85.0 80.0 - 100.0 fL   MCH 29.3 26.0 - 34.0 pg   MCHC 34.5 30.0 - 36.0 g/dL   RDW 13.2 11.5 - 15.5 %   Platelets 211 150 - 400 K/uL   nRBC 0.0 0.0 - 0.2 %    Comment: Performed at [facility], [address] [place], [place] [address]  Differential     Status: Abnormal   Collection Time: 12/27/18  9:43 AM  Result Value Ref Range   Neutrophils Relative % 87 %   Neutro Abs 10.5 (H) 1.7 - 7.7 K/uL   Lymphocytes Relative 5 %    Lymphs Abs 0.7 0.7 - 4.0 K/uL   Monocytes Relative 8 %   Monocytes Absolute 1.0 0.1 - 1.0 K/uL   Eosinophils Relative 0 %   Eosinophils Absolute 0.0 0.0 - 0.5 K/uL   Basophils Relative 0 %   Basophils Absolute 0.0 0.0 - 0.1 K/uL   Immature Granulocytes 0 %   Abs Immature Granulocytes 0.04 0.00 - 0.07 K/uL    Comment: Performed at [facility], [address], [place], [place] [address]  Comprehensive metabolic panel     Status: Abnormal  Collection Time: 12/27/18  9:43 AM  Result Value Ref Range   Sodium 136 135 - 145 mmol/L   Potassium 3.8 3.5 - 5.1 mmol/L   Chloride 99 98 - 111 mmol/L   CO2 22 22 - 32 mmol/L   Glucose, Bld 140 (H) 70 - 99 mg/dL   BUN 31 (H) 8 - 23 mg/dL   Creatinine, Ser 1.25 (H) 0.44 - 1.00 mg/dL   Calcium 9.5 8.9 - 10.3 mg/dL   Total Protein 7.6 6.5 - 8.1 g/dL   Albumin 4.4 3.5 - 5.0 g/dL   AST 33 15 - 41 U/L   ALT 17 0 - 44 U/L   Alkaline Phosphatase 55 38 - 126 U/L   Total Bilirubin 1.0 0.3 - 1.2 mg/dL   GFR calc non Af Amer 36 (L) >60 mL/min   GFR calc Af Amer 41 (L) >60 mL/min   Anion gap 15 5 - 15    Comment: Performed at [facility], [address] [place], [place] [address]  Urine Drug Screen, Qualitative     Status: None   Collection Time: 12/27/18 10:23 AM  Result Value Ref Range   Tricyclic, Ur Screen NONE DETECTED NONE DETECTED   Amphetamines, Ur Screen NONE DETECTED NONE DETECTED   MDMA (Ecstasy)Ur Screen NONE DETECTED NONE DETECTED   Cocaine Metabolite,Ur [place] NONE DETECTED NONE DETECTED   Opiate, Ur Screen NONE DETECTED NONE DETECTED   Phencyclidine (PCP) Ur S NONE DETECTED NONE DETECTED   Cannabinoid 50 Ng, Ur [place] NONE DETECTED NONE DETECTED   Barbiturates, Ur Screen NONE DETECTED NONE DETECTED   Benzodiazepine, Ur Scrn NONE DETECTED NONE DETECTED   Methadone Scn, Ur NONE DETECTED NONE DETECTED    Comment: (NOTE) Tricyclics + metabolites, urine    Cutoff 1000 ng/mL Amphetamines + metabolites, urine  Cutoff 1000  ng/mL MDMA (Ecstasy), urine              Cutoff 500 ng/mL Cocaine Metabolite, urine          Cutoff 300 ng/mL Opiate + metabolites, urine        Cutoff 300 ng/mL Phencyclidine (PCP), urine         Cutoff 25 ng/mL Cannabinoid, urine                 Cutoff 50 ng/mL Barbiturates + metabolites, urine  Cutoff 200 ng/mL Benzodiazepine, urine              Cutoff 200 ng/mL Methadone, urine                   Cutoff 300 ng/mL The urine drug screen provides only a preliminary, unconfirmed analytical test result and should not be used for non-medical purposes. Clinical consideration and professional judgment should be applied to any positive drug screen result due to possible interfering substances. A more specific alternate chemical method must be used in order to obtain a confirmed analytical result. Gas chromatography / mass spectrometry (GC/MS) is the preferred confirmat ory method. Performed at [facility], [address] [place], [place] [address]   Urinalysis, Complete w Microscopic     Status: Abnormal   Collection Time: 12/27/18 10:23 AM  Result Value Ref Range   Color, Urine YELLOW (A) YELLOW   APPearance CLOUDY (A) CLEAR   Specific Gravity, Urine 1.014 1.005 - 1.030   pH 7.0 5.0 - 8.0   Glucose, UA NEGATIVE NEGATIVE mg/dL   Hgb urine dipstick NEGATIVE NEGATIVE   Bilirubin Urine NEGATIVE  NEGATIVE   Ketones, ur NEGATIVE NEGATIVE mg/dL   Protein, ur 30 (A) NEGATIVE mg/dL   Nitrite NEGATIVE NEGATIVE   Leukocytes,Ua TRACE (A) NEGATIVE   RBC / HPF 0-5 0 - 5 RBC/hpf   WBC, UA 21-50 0 - 5 WBC/hpf   Bacteria, UA FEW (A) NONE SEEN   Squamous Epithelial / LPF 0-5 0 - 5   Mucus PRESENT     Comment: Performed at [facility], [address], [place], [place] [address]   Ct Angio Head W Or Wo Contrast  Result Date: 12/27/2018 CLINICAL DATA:  Stroke, follow-up. Slurred speech, left-sided weakness. EXAM: CT ANGIOGRAPHY HEAD AND NECK CT PERFUSION BRAIN TECHNIQUE:  Multidetector CT imaging of the head and neck was performed using the standard protocol during bolus administration of intravenous contrast. Multiplanar CT image reconstructions and MIPs were obtained to evaluate the vascular anatomy. Carotid stenosis measurements (when applicable) are obtained utilizing NASCET criteria, using the distal internal carotid diameter as the denominator. Multiphase CT imaging of the brain was performed following IV bolus contrast injection. Subsequent parametric perfusion maps were calculated using RAPID software. CONTRAST:  121m OMNIPAQUE IOHEXOL 350 MG/ML SOLN COMPARISON:  None. Noncontrast head CT performed earlier the same day 12/27/2018 head CT 03/12/2006 FINDINGS: CT HEAD FINDINGS Brain: No evidence of acute intracranial hemorrhage. There is abnormal hypodensity within the right insula consistent with acute infarction change (series 12, image 15). No evidence of intracranial mass. No midline shift or extra-axial fluid collection. Ill-defined hypoattenuation within the cerebral white matter consistent with chronic small vessel ischemic disease. Chronic infarcts within the bilateral cerebellum. Mild generalized parenchymal atrophy. Vascular: Reported separately Skull: Normal. Negative for fracture or focal lesion. Sinuses/Orbits: Visualized orbits demonstrate no acute abnormality. No significant paranasal sinus disease or mastoid effusion ASPECTS (ANordicStroke Program Early CT Score) - Ganglionic level infarction (caudate, lentiform nuclei, internal capsule, insula, M1-M3 cortex): 6 - Supraganglionic infarction (M4-M6 cortex): 3 Total score (0-10 with 10 being normal): 9 Review of the MIP images confirms the above findings CTA NECK FINDINGS Aortic arch: Standard branching. Scattered soft and calcified plaque within the visualized aortic arch and proximal major branch vessels of the neck. Right carotid system: CCA and ICA widely patent within the neck without measurable stenosis.  Left carotid system: CCA and ICA widely patent within the neck without measurable stenosis. Vertebral arteries: Cervical vertebral arteries patent bilaterally without significant stenosis (50% or greater). Skeleton: No acute bony abnormality. Cervical spondylosis with multilevel posterior disc osteophytes, uncovertebral and facet hypertrophy. Severe C5-C6 intervertebral disc height loss with possible bony ankylosis. Other neck: No soft tissue neck mass or pathologically enlarged cervical chain lymph nodes. Thyroid negative. Upper chest: No consolidation within the imaged lung apices. Review of the MIP images confirms the above findings CTA HEAD FINDINGS Anterior circulation: The intracranial internal carotid arteries are patent bilaterally. Calcified plaque within the cavernous/paraclinoid segments with regions of mild stenosis. 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 The M1 right middle cerebral artery is patent without significant stenosis. There is segmental occlusion of a proximal M2 middle cerebral artery branch (series 6, images 212-214). There is some reconstitution of flow more distally within this vessel. The bilateral anterior cerebral arteries and left middle cerebral artery are patent without significant proximal stenosis. Posterior circulation: The intracranial vertebral arteries are patent. Calcified plaque within the intracranial left vertebral artery with focal mild/moderate stenosis. The basilar artery is patent without significant stenosis. The bilateral  posterior cerebral arteries are patent. Moderate focal stenosis within the P2 right posterior cerebral artery (series 9, image 42). Posterior communicating arteries are present bilaterally. Venous sinuses: Within limitations of contrast timing, no convincing thrombus. Review of the MIP images confirms the above findings CT Brain Perfusion Findings: ASPECTS: 9 CBF (<30%)  Volume: 0 mL identified Perfusion (Tmax>6.0s) volume: 60 mL (predominantly within the right MCA vascular territory). Mismatch Volume: 60 mL Infarction Location:Right MCA vascular territory. These results were called by telephone at the time of interpretation on 12/27/2018 at 11:49 am to provider Dr. Joni Fears, who verbally acknowledged these results. IMPRESSION: CT head: 1. Abnormal hypodensity within the right insula consistent with acute infarction change. ASPECTS 9. 2. Generalized parenchymal atrophy with chronic small vessel ischemic disease. Chronic infarcts within the bilateral cerebellum. CTA neck: Common carotid, internal carotid and vertebral arteries patent within the neck bilaterally without significant stenosis. CTA head: 1. 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 Some reconstitution of flow is seen more distally within this vessel. 2. Additional intracranial atherosclerotic stenoses, most notably as follows. Mild/moderate focal stenosis within the intracranial left vertebral artery. Moderate focal stenosis within the P2 right posterior cerebral artery. 3. 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 CT perfusion head: No core infarct is identified by the perfusion software. However, definite acute infarction changes are demonstrated within the right insula on concurrent noncontrast head CT. ASPECTS 9. 60 mL region of hypoperfused parenchyma detected predominantly within the right MCA vascular territory. Reported mismatch 60 mL. Electronically Signed   By: Kellie Simmering DO   On: 12/27/2018 11:58   Dg Chest 1 View  Result Date: 12/27/2018 CLINICAL DATA:  Weakness after fall. EXAM: CHEST  1 VIEW COMPARISON:  None. FINDINGS: Mild cardiomegaly. Status post coronary bypass graft. No pneumothorax or pleural effusion is noted. Both lungs are clear. The visualized skeletal structures are unremarkable. IMPRESSION: No active disease. Electronically  Signed   By: Marijo Conception M.D.   On: 12/27/2018 12:05   Ct Head Wo Contrast  Result Date: 12/27/2018 CLINICAL DATA:  Left-sided weakness. Slurred speech. Questionable unwitnessed fall EXAM: CT HEAD WITHOUT CONTRAST TECHNIQUE: Contiguous axial images were obtained from the base of the skull through the vertex without intravenous contrast. COMPARISON:  March 12, 2006 FINDINGS: Brain: There is mild diffuse atrophy. There is no intracranial mass, hemorrhage, extra-axial fluid collection, or midline shift. There is evidence of a prior focal infarct in the superior right cerebellum compared to the prior study. There is patchy small vessel disease in the centra semiovale bilaterally. No acute appearing infarct is demonstrable on this study. Vascular: No hyperdense vessel. There are foci of calcification in the distal left vertebral artery and in the carotid siphon regions bilaterally. Skull: The bony calvarium appears intact. Sinuses/Orbits: There is mucosal thickening in several ethmoid air cells. Other visualized paranasal sinuses are clear. Orbits appear symmetric bilaterally. Other: Visualized mastoid air cells are clear. IMPRESSION: Atrophy with periventricular small vessel disease. Prior small infarct in the superior right cerebellum. No acute infarct evident. No mass or hemorrhage. There are foci of arterial vascular calcification. There is mucosal thickening in several ethmoid air cells. Electronically Signed   By: Lowella Grip III M.D.   On: 12/27/2018 10:06   Ct Angio Neck W Or Wo Contrast  Result Date: 12/27/2018 CLINICAL DATA:  Stroke, follow-up. Slurred speech, left-sided weakness. EXAM: CT ANGIOGRAPHY HEAD AND NECK CT PERFUSION BRAIN TECHNIQUE: Multidetector CT imaging of the  head and neck was performed using the standard protocol during bolus administration of intravenous contrast. Multiplanar CT image reconstructions and MIPs were obtained to evaluate the vascular anatomy. Carotid  stenosis measurements (when applicable) are obtained utilizing NASCET criteria, using the distal internal carotid diameter as the denominator. Multiphase CT imaging of the brain was performed following IV bolus contrast injection. Subsequent parametric perfusion maps were calculated using RAPID software. CONTRAST:  175m OMNIPAQUE IOHEXOL 350 MG/ML SOLN COMPARISON:  None. Noncontrast head CT performed earlier the same day 12/27/2018 head CT 03/12/2006 FINDINGS: CT HEAD FINDINGS Brain: No evidence of acute intracranial hemorrhage. There is abnormal hypodensity within the right insula consistent with acute infarction change (series 12, image 15). No evidence of intracranial mass. No midline shift or extra-axial fluid collection. Ill-defined hypoattenuation within the cerebral white matter consistent with chronic small vessel ischemic disease. Chronic infarcts within the bilateral cerebellum. Mild generalized parenchymal atrophy. Vascular: Reported separately Skull: Normal. Negative for fracture or focal lesion. Sinuses/Orbits: Visualized orbits demonstrate no acute abnormality. No significant paranasal sinus disease or mastoid effusion ASPECTS (AAlexanderStroke Program Early CT Score) - Ganglionic level infarction (caudate, lentiform nuclei, internal capsule, insula, M1-M3 cortex): 6 - Supraganglionic infarction (M4-M6 cortex): 3 Total score (0-10 with 10 being normal): 9 Review of the MIP images confirms the above findings CTA NECK FINDINGS Aortic arch: Standard branching. Scattered soft and calcified plaque within the visualized aortic arch and proximal major branch vessels of the neck. Right carotid system: CCA and ICA widely patent within the neck without measurable stenosis. Left carotid system: CCA and ICA widely patent within the neck without measurable stenosis. Vertebral arteries: Cervical vertebral arteries patent bilaterally without significant stenosis (50% or greater). Skeleton: No acute bony abnormality.  Cervical spondylosis with multilevel posterior disc osteophytes, uncovertebral and facet hypertrophy. Severe C5-C6 intervertebral disc height loss with possible bony ankylosis. Other neck: No soft tissue neck mass or pathologically enlarged cervical chain lymph nodes. Thyroid negative. Upper chest: No consolidation within the imaged lung apices. Review of the MIP images confirms the above findings CTA HEAD FINDINGS Anterior circulation: The intracranial internal carotid arteries are patent bilaterally. Calcified plaque within the cavernous/paraclinoid segments with regions of mild stenosis. 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 The M1 right middle cerebral artery is patent without significant stenosis. There is segmental occlusion of a proximal M2 middle cerebral artery branch (series 6, images 212-214). There is some reconstitution of flow more distally within this vessel. The bilateral anterior cerebral arteries and left middle cerebral artery are patent without significant proximal stenosis. Posterior circulation: The intracranial vertebral arteries are patent. Calcified plaque within the intracranial left vertebral artery with focal mild/moderate stenosis. The basilar artery is patent without significant stenosis. The bilateral posterior cerebral arteries are patent. Moderate focal stenosis within the P2 right posterior cerebral artery (series 9, image 42). Posterior communicating arteries are present bilaterally. Venous sinuses: Within limitations of contrast timing, no convincing thrombus. Review of the MIP images confirms the above findings CT Brain Perfusion Findings: ASPECTS: 9 CBF (<30%) Volume: 0 mL identified Perfusion (Tmax>6.0s) volume: 60 mL (predominantly within the right MCA vascular territory). Mismatch Volume: 60 mL Infarction Location:Right MCA vascular territory. These results were called by telephone at the time of  interpretation on 12/27/2018 at 11:49 am to provider Dr. SJoni Fears who verbally acknowledged these results. IMPRESSION: CT head: 1. Abnormal hypodensity within the right insula consistent with acute infarction change. ASPECTS 9. 2. Generalized  parenchymal atrophy with chronic small vessel ischemic disease. Chronic infarcts within the bilateral cerebellum. CTA neck: Common carotid, internal carotid and vertebral arteries patent within the neck bilaterally without significant stenosis. CTA head: 1. 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 Some reconstitution of flow is seen more distally within this vessel. 2. Additional intracranial atherosclerotic stenoses, most notably as follows. Mild/moderate focal stenosis within the intracranial left vertebral artery. Moderate focal stenosis within the P2 right posterior cerebral artery. 3. 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 CT perfusion head: No core infarct is identified by the perfusion software. However, definite acute infarction changes are demonstrated within the right insula on concurrent noncontrast head CT. ASPECTS 9. 60 mL region of hypoperfused parenchyma detected predominantly within the right MCA vascular territory. Reported mismatch 60 mL. Electronically Signed   By: Kellie Simmering DO   On: 12/27/2018 11:58   Ct Cerebral Perfusion W Contrast  Result Date: 12/27/2018 CLINICAL DATA:  Stroke, follow-up. Slurred speech, left-sided weakness. EXAM: CT ANGIOGRAPHY HEAD AND NECK CT PERFUSION BRAIN TECHNIQUE: Multidetector CT imaging of the head and neck was performed using the standard protocol during bolus administration of intravenous contrast. Multiplanar CT image reconstructions and MIPs were obtained to evaluate the vascular anatomy. Carotid stenosis measurements (when applicable) are obtained utilizing NASCET criteria, using the distal internal carotid diameter as the denominator. Multiphase  CT imaging of the brain was performed following IV bolus contrast injection. Subsequent parametric perfusion maps were calculated using RAPID software. CONTRAST:  174m OMNIPAQUE IOHEXOL 350 MG/ML SOLN COMPARISON:  None. Noncontrast head CT performed earlier the same day 12/27/2018 head CT 03/12/2006 FINDINGS: CT HEAD FINDINGS Brain: No evidence of acute intracranial hemorrhage. There is abnormal hypodensity within the right insula consistent with acute infarction change (series 12, image 15). No evidence of intracranial mass. No midline shift or extra-axial fluid collection. Ill-defined hypoattenuation within the cerebral white matter consistent with chronic small vessel ischemic disease. Chronic infarcts within the bilateral cerebellum. Mild generalized parenchymal atrophy. Vascular: Reported separately Skull: Normal. Negative for fracture or focal lesion. Sinuses/Orbits: Visualized orbits demonstrate no acute abnormality. No significant paranasal sinus disease or mastoid effusion ASPECTS ([place]roke [place] Early CT Score) - Ganglionic level infarction (caudate, lentiform nuclei, internal capsule, insula, M1-M3 cortex): 6 - Supraganglionic infarction (M4-M6 cortex): 3 Total score (0-10 with 10 being normal): 9 Review of the MIP images confirms the above findings CTA NECK FINDINGS Aortic arch: Standard branching. Scattered soft and calcified plaque within the visualized aortic arch and proximal major branch vessels of the neck. Right carotid system: CCA and ICA widely patent within the neck without measurable stenosis. Left carotid system: CCA and ICA widely patent within the neck without measurable stenosis. Vertebral arteries: Cervical vertebral arteries patent bilaterally without significant stenosis (50% or greater). Skeleton: No acute bony abnormality. Cervical spondylosis with multilevel posterior disc osteophytes, uncovertebral and facet hypertrophy. Severe C5-C6 intervertebral disc height loss with  possible bony ankylosis. Other neck: No soft tissue neck mass or pathologically enlarged cervical chain lymph nodes. Thyroid negative. Upper chest: No consolidation within the imaged lung apices. Review of the MIP images confirms the above findings CTA HEAD FINDINGS Anterior circulation: The intracranial internal carotid arteries are patent bilaterally. Calcified plaque within the cavernous/paraclinoid segments with regions of mild stenosis. 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 The M1 right middle cerebral artery is patent without significant stenosis. There is  segmental occlusion of a proximal M2 middle cerebral artery branch (series 6, images 212-214). There is some reconstitution of flow more distally within this vessel. The bilateral anterior cerebral arteries and left middle cerebral artery are patent without significant proximal stenosis. Posterior circulation: The intracranial vertebral arteries are patent. Calcified plaque within the intracranial left vertebral artery with focal mild/moderate stenosis. The basilar artery is patent without significant stenosis. The bilateral posterior cerebral arteries are patent. Moderate focal stenosis within the P2 right posterior cerebral artery (series 9, image 42). Posterior communicating arteries are present bilaterally. Venous sinuses: Within limitations of contrast timing, no convincing thrombus. Review of the MIP images confirms the above findings CT Brain Perfusion Findings: ASPECTS: 9 CBF (<30%) Volume: 0 mL identified Perfusion (Tmax>6.0s) volume: 60 mL (predominantly within the right MCA vascular territory). Mismatch Volume: 60 mL Infarction Location:Right MCA vascular territory. These results were called by telephone at the time of interpretation on 12/27/2018 at 11:49 am to provider Dr. Joni Fears, who verbally acknowledged these results. IMPRESSION: CT head: 1. Abnormal hypodensity  within the right insula consistent with acute infarction change. ASPECTS 9. 2. Generalized parenchymal atrophy with chronic small vessel ischemic disease. Chronic infarcts within the bilateral cerebellum. CTA neck: Common carotid, internal carotid and vertebral arteries patent within the neck bilaterally without significant stenosis. CTA head: 1. 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 Some reconstitution of flow is seen more distally within this vessel. 2. Additional intracranial atherosclerotic stenoses, most notably as follows. Mild/moderate focal stenosis within the intracranial left vertebral artery. Moderate focal stenosis within the P2 right posterior cerebral artery. 3. 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 CT perfusion head: No core infarct is identified by the perfusion software. However, definite acute infarction changes are demonstrated within the right insula on concurrent noncontrast head CT. ASPECTS 9. 60 mL region of hypoperfused parenchyma detected predominantly within the right MCA vascular territory. Reported mismatch 60 mL. Electronically Signed   By: Kellie Simmering DO   On: 12/27/2018 11:58   Dg Hip Unilat W Or Wo Pelvis 2-3 Views Left  Result Date: 12/27/2018 CLINICAL DATA:  Left hip pain after unwitnessed fall. EXAM: DG HIP (WITH OR WITHOUT PELVIS) 2-3V LEFT COMPARISON:  None. FINDINGS: There is no evidence of hip fracture or dislocation. There is no evidence of arthropathy or other focal bone abnormality. IMPRESSION: Negative. Electronically Signed   By: Marijo Conception M.D.   On: 12/27/2018 12:04    Assessment: 85 y.o. female presenting with acute onset of left hemineglect, left visual field cut, left sided weakness and left hemisensory loss.  1. Exam findings are consistent with a large region of hypoperfusion versus infarction within the right MCA territory.  2. CT head: Abnormal hypodensity within the right  insula consistent with acute infarction change. ASPECTS 9. Generalized parenchymal atrophy with chronic small vessel ischemic disease. Chronic infarcts within the bilateral cerebellum.  3. CTA neck: Common carotid, internal carotid and vertebral arteries patent within the neck bilaterally without significant stenosis.  4. CTA head: Segmental occlusion of a proximal M2 right middle cerebral artery branch. Some reconstitution of flow is seen more distally within this vessel. Additional intracranial atherosclerotic stenoses, most notably as follows. Mild/moderate focal stenosis within the intracranial left vertebral artery. Moderate focal stenosis within the P2 right posterior cerebral artery. 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  5. CT perfusion head: No core infarct is identified by the perfusion software.  However, definite acute infarction changes are demonstrated within the right insula on concurrent noncontrast head CT.  60 mL region of hypoperfused parenchyma detected predominantly within the right MCA vascular territory. Reported mismatch 60 mL.  6. Stroke Risk Factors - CAD, hypercholesterolemia, HTN and history of cancer  Plan: 1. The patient is a candidate for mechanical thrombectomy. Family has provided informed consent for VIR.  2. Following VIR, will admit to the ICU under the Neurology service.  3. MRI brain when stable.  4. TTE 5. Cardiac telemetry 6. PT consult, OT consult, Speech consult 7. No antiplatelet agents or anticoagulants for 24 hours following VIR. Can start if follow up CT at 24 hours shows no hemorrhage.  8. BP management post-procedure per Dr. Estanislado Pandy  45 minutes spent in the emergent neurological evaluation and management of this critically ill patient  Electronically signed: Dr. Kerney Elbe 12/27/2018, 2:14 PM

## 2018-12-27 NOTE — Transfer of Care (Signed)
Immediate Anesthesia Transfer of Care Note  Patient: Alexis Haas  Procedure(s) Performed: CODE STROKE (N/A )  Patient Location: ICU  Anesthesia Type:General  Level of Consciousness: sedated and Patient remains intubated per anesthesia plan  Airway & Oxygen Therapy: Patient remains intubated per anesthesia plan and Patient placed on Ventilator (see vital sign flow sheet for setting)  Post-op Assessment: Report given to RN and Post -op Vital signs reviewed and stable  Post vital signs: Reviewed and stable BP 125/55 per art line  Last Vitals:  Vitals Value Taken Time  BP 98/51 12/27/18 1722  Temp    Pulse 77 12/27/18 1724  Resp 18 12/27/18 1724  SpO2 98 % 12/27/18 1724  Vitals shown include unvalidated device data.  Last Pain:  Vitals:   12/27/18 1358  TempSrc: Oral         Complications: No apparent anesthesia complications

## 2018-12-27 NOTE — Sedation Documentation (Signed)
Right femoral sheath removed, 8fr angioseal closure device used.  

## 2018-12-27 NOTE — Progress Notes (Signed)
CODE STROKE- PHARMACY COMMUNICATION   Time CODE STROKE called/page received:0947  Time response to CODE STROKE was made (in person or via phone): 1030  Time Stroke Kit retrieved from [facility] (only if needed): N/A  Name of Provider/Nurse contacted:Olivia   Past Medical History:  Diagnosis Date  . Anemia   . Arthritis   . Cancer ([place]) 04/15/2016   T3, N1a adenocarcinoma of the hepatic flexure. No mismatch repair gene abnormality.   . Chronic kidney disease    LABS 3/18 COMPARED WITH 1 YEAR AGO  . Coronary artery disease   . Fall 2018  . GERD (gastroesophageal reflux disease)   . Heart disease   . Hypercholesteremia   . Hypertension   . Insomnia    Prior to Admission medications   Medication Sig Start Date End Date Taking? Authorizing Provider  aspirin EC 81 MG tablet Take 81 mg by mouth every 4 (four) hours as needed.    Yes [provider]  ferrous sulfate 325 (65 FE) MG tablet Take 325 mg by mouth daily with breakfast.   Yes [provider]  metoprolol tartrate (LOPRESSOR) 25 MG tablet Take 25 mg by mouth daily.    Yes [provider]  naproxen sodium (ANAPROX) 220 MG tablet Take 220 mg by mouth daily as needed (for pain).    Yes [provider]  omeprazole (PRILOSEC) 20 MG capsule Take 20 mg by mouth at bedtime.    Yes [provider]  triamterene-hydrochlorothiazide (MAXZIDE-25) 37.5-25 MG tablet Take 1 tablet by mouth daily.   Yes [provider]  hydrochlorothiazide (HYDRODIURIL) 25 MG tablet Take 25 mg by mouth daily.  12/27/18  [provider]  lovastatin (MEVACOR) 40 MG tablet Take 40 mg by mouth at bedtime.  12/27/18  [provider]  triamterene-hydrochlorothiazide (DYAZIDE) 37.5-25 MG capsule Take 1 capsule by mouth daily. 11/07/18 12/27/18  [provider]    Charlett Nose ,PharmD Clinical Pharmacist  12/27/2018  2:05 PM

## 2018-12-27 NOTE — Code Documentation (Signed)
Pt arrives via EMS, per son at bedside he saw pt WNL 11/15 @ 1800, pt was found this AM on the floor, pt lives alone and sons check on her, upon arrival to ED pt has dysarthria and left sided weakness, initial NIHSS 5, code stroke activated, CBG 121, no tPA LKW > 4.5 hrspt brought back to room for evaluation after non con head CT, son at bedside, CTA/CTP ordered, RN Kim at bedside aware, report off to [employer]

## 2018-12-27 NOTE — Progress Notes (Signed)
Patient ID: Alexis Haas, female   DOB: 1917-10-13, 85 y.o.   MRN: [id number] INR . POst treatment CT NO ICH or mass effect or shift. RT groin hemostasis  achieved with  An 27F angioseal. Distal pulses dopplerable DP and PT bilaterally.Patient left intubated with COVID test results still pending. S.Aniket Paye MD

## 2018-12-27 NOTE — Progress Notes (Signed)
   12/27/18 1100  Clinical Encounter Type  Visited With Patient and family together  Visit Type Initial;Psychological support  Referral From Nurse  Stress Factors  Patient Stress Factors Health changes  Family Stress Factors Major life changes  Ch responded to Code Stroke. Pt was being assessed and son was at bedside. Ch provided the son a caring presence and an offering ear. Son explained that given the pt's age, he was expecting this hospital visit to happen eventually one day. Son seemed prepared for whatever was coming and said pt has had a blessed life. 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 Pt also shared his childhood stories where he got to witness the development of various technology that changed the overall life style of his. Pt also talked about his father who passed 30 something years ago and shared his memories of him. Pt received several tests and rested during the time of visit. The visit was appreciated.

## 2018-12-27 NOTE — Progress Notes (Signed)
Patient ID: Alexis Haas, female   DOB: [birth year], 85 y.o.   MRN: [id number] INR. 16 y RT H F LSW [address]pm LN.New onset of LT sided weakness and neglect .   CT BRAin No ICH ASPECTS 9. MRSS 1. CTA occluded RT MCA dominant inf division with  A penumbra of 60 ml and n0 core.Endovascular treatment option D/W daughter b. Reasons,procedure alternatives reviewed. Risks of ICH of 10 %,worsening neuro deficits ,death ,iaability to revascularize were discussed.Daughter expressed understanding and provided consent to proceed. S.Syrus Nakama MD

## 2018-12-28 ENCOUNTER — Inpatient Hospital Stay (HOSPITAL_COMMUNITY): Payer: Medicare HMO

## 2018-12-28 ENCOUNTER — Encounter (HOSPITAL_COMMUNITY): Payer: Self-pay | Admitting: Radiology

## 2018-12-28 DIAGNOSIS — I6389 Other cerebral infarction: Secondary | ICD-10-CM

## 2018-12-28 DIAGNOSIS — I10 Essential (primary) hypertension: Secondary | ICD-10-CM

## 2018-12-28 DIAGNOSIS — N390 Urinary tract infection, site not specified: Secondary | ICD-10-CM

## 2018-12-28 DIAGNOSIS — R1312 Dysphagia, oropharyngeal phase: Secondary | ICD-10-CM

## 2018-12-28 DIAGNOSIS — I480 Paroxysmal atrial fibrillation: Secondary | ICD-10-CM

## 2018-12-28 DIAGNOSIS — E785 Hyperlipidemia, unspecified: Secondary | ICD-10-CM

## 2018-12-28 LAB — BASIC METABOLIC PANEL
Anion gap: 16 — ABNORMAL HIGH (ref 5–15)
BUN: 21 mg/dL (ref 8–23)
CO2: 18 mmol/L — ABNORMAL LOW (ref 22–32)
Calcium: 7.9 mg/dL — ABNORMAL LOW (ref 8.9–10.3)
Chloride: 103 mmol/L (ref 98–111)
Creatinine, Ser: 1.4 mg/dL — ABNORMAL HIGH (ref 0.44–1.00)
GFR calc Af Amer: 36 mL/min — ABNORMAL LOW (ref >60)
GFR calc non Af Amer: 31 mL/min — ABNORMAL LOW (ref >60)
Glucose, Bld: 169 mg/dL — ABNORMAL HIGH (ref 70–99)
Potassium: 3.7 mmol/L (ref 3.5–5.1)
Sodium: 137 mmol/L (ref 135–145)

## 2018-12-28 LAB — GLUCOSE, CAPILLARY
Glucose-Capillary: 121 mg/dL — ABNORMAL HIGH (ref 70–99)
Glucose-Capillary: 132 mg/dL — ABNORMAL HIGH (ref 70–99)
Glucose-Capillary: 182 mg/dL — ABNORMAL HIGH (ref 70–99)
Glucose-Capillary: 161 mg/dL — ABNORMAL HIGH (ref 70–99)
Glucose-Capillary: 132 mg/dL — ABNORMAL HIGH (ref 70–99)
Glucose-Capillary: 97 mg/dL (ref 70–99)
Glucose-Capillary: 155 mg/dL — ABNORMAL HIGH (ref 70–99)
Glucose-Capillary: 62 mg/dL — ABNORMAL LOW (ref 70–99)
Glucose-Capillary: 70 mg/dL (ref 70–99)

## 2018-12-28 LAB — CBC WITH DIFFERENTIAL/PLATELET
Abs Immature Granulocytes: 0.08 10*3/uL — ABNORMAL HIGH (ref 0.00–0.07)
Basophils Absolute: 0 10*3/uL (ref 0.0–0.1)
Basophils Relative: 0 %
Eosinophils Absolute: 0 10*3/uL (ref 0.0–0.5)
Eosinophils Relative: 0 %
HCT: 38.8 % (ref 36.0–46.0)
Hemoglobin: 13.2 g/dL (ref 12.0–15.0)
Immature Granulocytes: 1 %
Lymphocytes Relative: 7 %
Lymphs Abs: 0.9 10*3/uL (ref 0.7–4.0)
MCH: 30.3 pg (ref 26.0–34.0)
MCHC: 34 g/dL (ref 30.0–36.0)
MCV: 89.2 fL (ref 80.0–100.0)
Monocytes Absolute: 0.9 10*3/uL (ref 0.1–1.0)
Monocytes Relative: 7 %
Neutro Abs: 11.8 10*3/uL — ABNORMAL HIGH (ref 1.7–7.7)
Neutrophils Relative %: 85 %
Platelets: 203 10*3/uL (ref 150–400)
RBC: 4.35 MIL/uL (ref 3.87–5.11)
RDW: 13.6 % (ref 11.5–15.5)
WBC: 13.7 10*3/uL — ABNORMAL HIGH (ref 4.0–10.5)
nRBC: 0 % (ref 0.0–0.2)

## 2018-12-28 LAB — LIPID PANEL
Cholesterol: 186 mg/dL (ref 0–200)
HDL: 44 mg/dL (ref >40)
LDL Cholesterol: 118 mg/dL — ABNORMAL HIGH (ref 0–99)
Total CHOL/HDL Ratio: 4.2 RATIO
Triglycerides: 120 mg/dL (ref <150)
VLDL: 24 mg/dL (ref 0–40)

## 2018-12-28 LAB — ECHOCARDIOGRAM COMPLETE
Height: 66 in
Weight: 2720 oz

## 2018-12-28 LAB — MAGNESIUM: Magnesium: 1.7 mg/dL (ref 1.7–2.4)

## 2018-12-28 LAB — HEMOGLOBIN A1C
Hgb A1c MFr Bld: 6.1 % — ABNORMAL HIGH (ref 4.8–5.6)
Mean Plasma Glucose: 128.37 mg/dL

## 2018-12-28 MED ORDER — MAGNESIUM SULFATE 2 GM/50ML IV SOLN
2.0000 g | Freq: Once | INTRAVENOUS | Status: AC
  Administered 2018-12-28: 2 g via INTRAVENOUS
  Filled 2018-12-28: qty 50

## 2018-12-28 MED ORDER — METOPROLOL TARTRATE 5 MG/5ML IV SOLN: 5.0000 mg | Freq: Three times a day (TID) | INTRAVENOUS | Status: DC | PRN

## 2018-12-28 MED ORDER — POTASSIUM CHLORIDE 10 MEQ/100ML IV SOLN
10.0000 meq | INTRAVENOUS | Status: AC
  Administered 2018-12-28 (×2): 10 meq via INTRAVENOUS
  Filled 2018-12-28 (×2): qty 100

## 2018-12-28 MED ORDER — LORAZEPAM 2 MG/ML IJ SOLN
1.0000 mg | Freq: Once | INTRAMUSCULAR | Status: AC | PRN
  Administered 2018-12-28: 1 mg via INTRAVENOUS
  Filled 2018-12-28: qty 1

## 2018-12-28 MED ORDER — METOPROLOL TARTRATE 5 MG/5ML IV SOLN
INTRAVENOUS | Status: AC
  Filled 2018-12-28: qty 5

## 2018-12-28 MED ORDER — METOPROLOL TARTRATE 5 MG/5ML IV SOLN
5.0000 mg | Freq: Once | INTRAVENOUS | Status: AC
  Administered 2018-12-28: 5 mg via INTRAVENOUS

## 2018-12-28 MED ORDER — HALOPERIDOL LACTATE 5 MG/ML IJ SOLN
2.0000 mg | INTRAMUSCULAR | Status: DC | PRN
  Filled 2018-12-28: qty 1

## 2018-12-28 MED ORDER — INSULIN ASPART 100 UNIT/ML ~~LOC~~ SOLN
1.0000 [IU] | SUBCUTANEOUS | Status: DC
  Administered 2018-12-28 (×2): 2 [IU] via SUBCUTANEOUS

## 2018-12-28 MED ORDER — CHLORHEXIDINE GLUCONATE CLOTH 2 % EX PADS
6.0000 | MEDICATED_PAD | Freq: Every day | CUTANEOUS | Status: DC
  Administered 2018-12-28 – 2019-01-02 (×5): 6 via TOPICAL

## 2018-12-28 NOTE — Progress Notes (Signed)
SLP Cancellation Note  Patient Details Name: Alexis Haas MRN: [id number] DOB: [birth year]   Cancelled treatment:       Pt on vent. Will continue efforts.    [name] 12/28/2018, 7:17 AM

## 2018-12-28 NOTE — Progress Notes (Signed)
Chief Complaint: Patient was seen today for (R)CVA  Supervising Physician: Luanne Bras  Patient Status: [facility] - In-pt  Subjective: S/P RT common carotid arteriogram followed by partial revacularization of occluded RT MCA dominant inf division with x 2 passes with solitaire 39m x 40 mm x retiever device achieving a TICI 2b revascularization.  Pt now extubated this am. Still agitated and fidgety in bed.  Objective: Physical Exam: BP (!) 101/41    Pulse (!) 51    Temp 99.7 F (37.6 C) (Axillary)    Resp 16    SpO2 97%  Awake, responds to name and answers simple questions. Able to move left UE some, no grip strength. (R) groin soft, NT, no hematoma Legs warm, good doppler pulses   Current Facility-Administered Medications:     stroke: mapping our early stages of recovery book, , Does not apply, Once, LKerney Elbe MD   0.9 %  sodium chloride infusion, , Intravenous, Continuous, Deveshwar, Sanjeev, MD, Last Rate: 75 mL/hr at 12/28/18 0900   0.9 %  sodium chloride infusion, 250 mL, Intravenous, Continuous, Bowser, GLaurel Dimmer NP   acetaminophen (TYLENOL) tablet 650 mg, 650 mg, Oral, Q4H PRN **OR** acetaminophen (TYLENOL) 160 MG/5ML solution 650 mg, 650 mg, Per Tube, Q4H PRN **OR** acetaminophen (TYLENOL) suppository 650 mg, 650 mg, Rectal, Q4H PRN, Deveshwar, Sanjeev, MD   albuterol (PROVENTIL) (2.5 MG/3ML) 0.083% nebulizer solution 2.5 mg, 2.5 mg, Nebulization, Q2H PRN, Bowser, GLaurel Dimmer NP   aspirin EC tablet 325 mg, 325 mg, Oral, Daily **OR** aspirin suppository 300 mg, 300 mg, Rectal, Daily, XRosalin Hawking MD   bisacodyl (DULCOLAX) suppository 10 mg, 10 mg, Rectal, Daily PRN, Bowser, GLaurel Dimmer NP   cefTRIAXone (ROCEPHIN) 2 g in sodium chloride 0.9 % 100 mL IVPB, 2 g, Intravenous, Q24H, SJennelle HumanB, NP, Stopped at 12/27/18 2339   chlorhexidine gluconate (MEDLINE KIT) (PERIDEX) 0.12 % solution 15 mL, 15 mL, Mouth Rinse, BID, Bowser, GLaurel Dimmer NP, 15 mL at 12/27/18  1934   Chlorhexidine Gluconate Cloth 2 % PADS 6 each, 6 each, Topical, Daily, LKerney Elbe MD   clevidipine (CLEVIPREX) infusion 0.5 mg/mL, 0-21 mg/hr, Intravenous, Continuous, Deveshwar, Sanjeev, MD, Stopped at 12/28/18 0008   dexmedetomidine (PRECEDEX) 400 MCG/100ML (4 mcg/mL) infusion, 0-1.2 mcg/kg/hr, Intravenous, Continuous, Bowser, GLaurel Dimmer NP, Stopped at 12/28/18 0802   fentaNYL (SUBLIMAZE) injection 25 mcg, 25 mcg, Intravenous, Q15 min PRN, Bowser, GLaurel Dimmer NP   fentaNYL (SUBLIMAZE) injection 25-100 mcg, 25-100 mcg, Intravenous, Q30 min PRN, Bowser, GLaurel Dimmer NP, 100 mcg at 12/28/18 0802   hydrALAZINE (APRESOLINE) injection 10 mg, 10 mg, Intravenous, Q4H PRN, AAmie Portland MD, 10 mg at 12/28/18 0204   insulin aspart (novoLOG) injection 1-3 Units, 1-3 Units, Subcutaneous, Q4H, GElie Confer SLars Masson NP   metoprolol tartrate (LOPRESSOR) 5 MG/5ML injection, , , ,    metoprolol tartrate (LOPRESSOR) injection 5 mg, 5 mg, Intravenous, Once, GElie Confer Sarah F, NP   metoprolol tartrate (LOPRESSOR) injection 5 mg, 5 mg, Intravenous, Q8H PRN, GElie Confer Sarah F, NP   pantoprazole (PROTONIX) injection 40 mg, 40 mg, Intravenous, Daily, Bowser, Grace E, NP, 40 mg at 12/27/18 1812   phenylephrine (NEOSYNEPHRINE) 10-0.9 MG/250ML-% infusion, 25-200 mcg/min, Intravenous, Titrated, Bowser, Grace E, NP   potassium chloride 10 mEq in 100 mL IVPB, 10 mEq, Intravenous, Q1 Hr x 2, Groce, Sarah F, NP   senna-docusate (Senokot-S) tablet 1 tablet, 1 tablet, Oral, QHS PRN, LKerney Elbe MD  Labs: CBC Recent Labs    12/27/18  3151 12/27/18 1811 12/28/18 0354  WBC 12.2*  --  13.7*  HGB 15.6* 12.2 13.2  HCT 45.2 36.0 38.8  PLT 211  --  203   BMET Recent Labs    12/27/18 0943 12/27/18 1811 12/28/18 0354  NA 136 136 137  K 3.8 3.2* 3.7  CL 99  --  103  CO2 22  --  18*  GLUCOSE 140*  --  169*  BUN 31*  --  21  CREATININE 1.25*  --  1.40*  CALCIUM 9.5  --  7.9*   LFT Recent Labs     12/27/18 0943  PROT 7.6  ALBUMIN 4.4  AST 33  ALT 17  ALKPHOS 55  BILITOT 1.0   PT/INR Recent Labs    12/27/18 0943  LABPROT 12.5  INR 0.9     Studies/Results: Ct Angio Head W Or Wo Contrast  Result Date: 12/27/2018 CLINICAL DATA:  Stroke, follow-up. Slurred speech, left-sided weakness. EXAM: CT ANGIOGRAPHY HEAD AND NECK CT PERFUSION BRAIN TECHNIQUE: Multidetector CT imaging of the head and neck was performed using the standard protocol during bolus administration of intravenous contrast. Multiplanar CT image reconstructions and MIPs were obtained to evaluate the vascular anatomy. Carotid stenosis measurements (when applicable) are obtained utilizing NASCET criteria, using the distal internal carotid diameter as the denominator. Multiphase CT imaging of the brain was performed following IV bolus contrast injection. Subsequent parametric perfusion maps were calculated using RAPID software. CONTRAST:  175m OMNIPAQUE IOHEXOL 350 MG/ML SOLN COMPARISON:  None. Noncontrast head CT performed earlier the same day 12/27/2018 head CT 03/12/2006 FINDINGS: CT HEAD FINDINGS Brain: No evidence of acute intracranial hemorrhage. There is abnormal hypodensity within the right insula consistent with acute infarction change (series 12, image 15). No evidence of intracranial mass. No midline shift or extra-axial fluid collection. Ill-defined hypoattenuation within the cerebral white matter consistent with chronic small vessel ischemic disease. Chronic infarcts within the bilateral cerebellum. Mild generalized parenchymal atrophy. Vascular: Reported separately Skull: Normal. Negative for fracture or focal lesion. Sinuses/Orbits: Visualized orbits demonstrate no acute abnormality. No significant paranasal sinus disease or mastoid effusion ASPECTS ([place]roke Program Early CT Score) - Ganglionic level infarction (caudate, lentiform nuclei, internal capsule, insula, M1-M3 cortex): 6 - Supraganglionic infarction  (M4-M6 cortex): 3 Total score (0-10 with 10 being normal): 9 Review of the MIP images confirms the above findings CTA NECK FINDINGS Aortic arch: Standard branching. Scattered soft and calcified plaque within the visualized aortic arch and proximal major branch vessels of the neck. Right carotid system: CCA and ICA widely patent within the neck without measurable stenosis. Left carotid system: CCA and ICA widely patent within the neck without measurable stenosis. Vertebral arteries: Cervical vertebral arteries patent bilaterally without significant stenosis (50% or greater). Skeleton: No acute bony abnormality. Cervical spondylosis with multilevel posterior disc osteophytes, uncovertebral and facet hypertrophy. Severe C5-C6 intervertebral disc height loss with possible bony ankylosis. Other neck: No soft tissue neck mass or pathologically enlarged cervical chain lymph nodes. Thyroid negative. Upper chest: No consolidation within the imaged lung apices. Review of the MIP images confirms the above findings CTA HEAD FINDINGS Anterior circulation: The intracranial internal carotid arteries are patent bilaterally. Calcified plaque within the cavernous/paraclinoid segments with regions of mild stenosis. 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 The M1 right middle cerebral artery is patent without significant stenosis. There is segmental occlusion of a proximal M2 middle cerebral artery branch (series 6, images 212-214).  There is some reconstitution of flow more distally within this vessel. The bilateral anterior cerebral arteries and left middle cerebral artery are patent without significant proximal stenosis. Posterior circulation: The intracranial vertebral arteries are patent. Calcified plaque within the intracranial left vertebral artery with focal mild/moderate stenosis. The basilar artery is patent without significant stenosis. The bilateral  posterior cerebral arteries are patent. Moderate focal stenosis within the P2 right posterior cerebral artery (series 9, image 42). Posterior communicating arteries are present bilaterally. Venous sinuses: Within limitations of contrast timing, no convincing thrombus. Review of the MIP images confirms the above findings CT Brain Perfusion Findings: ASPECTS: 9 CBF (<30%) Volume: 0 mL identified Perfusion (Tmax>6.0s) volume: 60 mL (predominantly within the right MCA vascular territory). Mismatch Volume: 60 mL Infarction Location:Right MCA vascular territory. These results were called by telephone at the time of interpretation on 12/27/2018 at 11:49 am to provider Dr. Joni Fears, who verbally acknowledged these results. IMPRESSION: CT head: 1. Abnormal hypodensity within the right insula consistent with acute infarction change. ASPECTS 9. 2. Generalized parenchymal atrophy with chronic small vessel ischemic disease. Chronic infarcts within the bilateral cerebellum. CTA neck: Common carotid, internal carotid and vertebral arteries patent within the neck bilaterally without significant stenosis. CTA head: 1. 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 Some reconstitution of flow is seen more distally within this vessel. 2. Additional intracranial atherosclerotic stenoses, most notably as follows. Mild/moderate focal stenosis within the intracranial left vertebral artery. Moderate focal stenosis within the P2 right posterior cerebral artery. 3. 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 CT perfusion head: No core infarct is identified by the perfusion software. However, definite acute infarction changes are demonstrated within the right insula on concurrent noncontrast head CT. ASPECTS 9. 60 mL region of hypoperfused parenchyma detected predominantly within the right MCA vascular territory. Reported mismatch 60 mL. Electronically Signed   By: Kellie Simmering DO   On:  12/27/2018 11:58   Dg Chest 1 View  Result Date: 12/27/2018 CLINICAL DATA:  Weakness after fall. EXAM: CHEST  1 VIEW COMPARISON:  None. FINDINGS: Mild cardiomegaly. Status post coronary bypass graft. No pneumothorax or pleural effusion is noted. Both lungs are clear. The visualized skeletal structures are unremarkable. IMPRESSION: No active disease. Electronically Signed   By: Marijo Conception M.D.   On: 12/27/2018 12:05   Ct Head Wo Contrast  Result Date: 12/27/2018 CLINICAL DATA:  Left-sided weakness. Slurred speech. Questionable unwitnessed fall EXAM: CT HEAD WITHOUT CONTRAST TECHNIQUE: Contiguous axial images were obtained from the base of the skull through the vertex without intravenous contrast. COMPARISON:  March 12, 2006 FINDINGS: Brain: There is mild diffuse atrophy. There is no intracranial mass, hemorrhage, extra-axial fluid collection, or midline shift. There is evidence of a prior focal infarct in the superior right cerebellum compared to the prior study. There is patchy small vessel disease in the centra semiovale bilaterally. No acute appearing infarct is demonstrable on this study. Vascular: No hyperdense vessel. There are foci of calcification in the distal left vertebral artery and in the carotid siphon regions bilaterally. Skull: The bony calvarium appears intact. Sinuses/Orbits: There is mucosal thickening in several ethmoid air cells. Other visualized paranasal sinuses are clear. Orbits appear symmetric bilaterally. Other: Visualized mastoid air cells are clear. IMPRESSION: Atrophy with periventricular small vessel disease. Prior small infarct in the superior right cerebellum. No acute infarct evident. No mass or hemorrhage. There are foci of arterial vascular calcification. There is mucosal thickening  in several ethmoid air cells. Electronically Signed   By: Lowella Grip III M.D.   On: 12/27/2018 10:06   Ct Angio Neck W Or Wo Contrast  Result Date: 12/27/2018 CLINICAL DATA:   Stroke, follow-up. Slurred speech, left-sided weakness. EXAM: CT ANGIOGRAPHY HEAD AND NECK CT PERFUSION BRAIN TECHNIQUE: Multidetector CT imaging of the head and neck was performed using the standard protocol during bolus administration of intravenous contrast. Multiplanar CT image reconstructions and MIPs were obtained to evaluate the vascular anatomy. Carotid stenosis measurements (when applicable) are obtained utilizing NASCET criteria, using the distal internal carotid diameter as the denominator. Multiphase CT imaging of the brain was performed following IV bolus contrast injection. Subsequent parametric perfusion maps were calculated using RAPID software. CONTRAST:  162m OMNIPAQUE IOHEXOL 350 MG/ML SOLN COMPARISON:  None. Noncontrast head CT performed earlier the same day 12/27/2018 head CT 03/12/2006 FINDINGS: CT HEAD FINDINGS Brain: No evidence of acute intracranial hemorrhage. There is abnormal hypodensity within the right insula consistent with acute infarction change (series 12, image 15). No evidence of intracranial mass. No midline shift or extra-axial fluid collection. Ill-defined hypoattenuation within the cerebral white matter consistent with chronic small vessel ischemic disease. Chronic infarcts within the bilateral cerebellum. Mild generalized parenchymal atrophy. Vascular: Reported separately Skull: Normal. Negative for fracture or focal lesion. Sinuses/Orbits: Visualized orbits demonstrate no acute abnormality. No significant paranasal sinus disease or mastoid effusion ASPECTS ([place] Early CT Score) - Ganglionic level infarction (caudate, lentiform nuclei, internal capsule, insula, M1-M3 cortex): 6 - Supraganglionic infarction (M4-M6 cortex): 3 Total score (0-10 with 10 being normal): 9 Review of the MIP images confirms the above findings CTA NECK FINDINGS Aortic arch: Standard branching. Scattered soft and calcified plaque within the visualized aortic arch and proximal major  branch vessels of the neck. Right carotid system: CCA and ICA widely patent within the neck without measurable stenosis. Left carotid system: CCA and ICA widely patent within the neck without measurable stenosis. Vertebral arteries: Cervical vertebral arteries patent bilaterally without significant stenosis (50% or greater). Skeleton: No acute bony abnormality. Cervical spondylosis with multilevel posterior disc osteophytes, uncovertebral and facet hypertrophy. Severe C5-C6 intervertebral disc height loss with possible bony ankylosis. Other neck: No soft tissue neck mass or pathologically enlarged cervical chain lymph nodes. Thyroid negative. Upper chest: No consolidation within the imaged lung apices. Review of the MIP images confirms the above findings CTA HEAD FINDINGS Anterior circulation: The intracranial internal carotid arteries are patent bilaterally. Calcified plaque within the cavernous/paraclinoid segments with regions of mild stenosis. 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 The M1 right middle cerebral artery is patent without significant stenosis. There is segmental occlusion of a proximal M2 middle cerebral artery branch (series 6, images 212-214). There is some reconstitution of flow more distally within this vessel. The bilateral anterior cerebral arteries and left middle cerebral artery are patent without significant proximal stenosis. Posterior circulation: The intracranial vertebral arteries are patent. Calcified plaque within the intracranial left vertebral artery with focal mild/moderate stenosis. The basilar artery is patent without significant stenosis. The bilateral posterior cerebral arteries are patent. Moderate focal stenosis within the P2 right posterior cerebral artery (series 9, image 42). Posterior communicating arteries are present bilaterally. Venous sinuses: Within limitations of contrast timing, no  convincing thrombus. Review of the MIP images confirms the above findings CT Brain Perfusion Findings: ASPECTS: 9 CBF (<30%) Volume: 0 mL identified Perfusion (Tmax>6.0s) volume: 60 mL (predominantly within the  right MCA vascular territory). Mismatch Volume: 60 mL Infarction Location:Right MCA vascular territory. These results were called by telephone at the time of interpretation on 12/27/2018 at 11:49 am to provider Dr. Joni Fears, who verbally acknowledged these results. IMPRESSION: CT head: 1. Abnormal hypodensity within the right insula consistent with acute infarction change. ASPECTS 9. 2. Generalized parenchymal atrophy with chronic small vessel ischemic disease. Chronic infarcts within the bilateral cerebellum. CTA neck: Common carotid, internal carotid and vertebral arteries patent within the neck bilaterally without significant stenosis. CTA head: 1. 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 Some reconstitution of flow is seen more distally within this vessel. 2. Additional intracranial atherosclerotic stenoses, most notably as follows. Mild/moderate focal stenosis within the intracranial left vertebral artery. Moderate focal stenosis within the P2 right posterior cerebral artery. 3. 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 CT perfusion head: No core infarct is identified by the perfusion software. However, definite acute infarction changes are demonstrated within the right insula on concurrent noncontrast head CT. ASPECTS 9. 60 mL region of hypoperfused parenchyma detected predominantly within the right MCA vascular territory. Reported mismatch 60 mL. Electronically Signed   By: Kellie Simmering DO   On: 12/27/2018 11:58   Ct Cerebral Perfusion W Contrast  Result Date: 12/27/2018 CLINICAL DATA:  Stroke, follow-up. Slurred speech, left-sided weakness. EXAM: CT ANGIOGRAPHY HEAD AND NECK CT PERFUSION BRAIN TECHNIQUE: Multidetector CT imaging of  the head and neck was performed using the standard protocol during bolus administration of intravenous contrast. Multiplanar CT image reconstructions and MIPs were obtained to evaluate the vascular anatomy. Carotid stenosis measurements (when applicable) are obtained utilizing NASCET criteria, using the distal internal carotid diameter as the denominator. Multiphase CT imaging of the brain was performed following IV bolus contrast injection. Subsequent parametric perfusion maps were calculated using RAPID software. CONTRAST:  125m OMNIPAQUE IOHEXOL 350 MG/ML SOLN COMPARISON:  None. Noncontrast head CT performed earlier the same day 12/27/2018 head CT 03/12/2006 FINDINGS: CT HEAD FINDINGS Brain: No evidence of acute intracranial hemorrhage. There is abnormal hypodensity within the right insula consistent with acute infarction change (series 12, image 15). No evidence of intracranial mass. No midline shift or extra-axial fluid collection. Ill-defined hypoattenuation within the cerebral white matter consistent with chronic small vessel ischemic disease. Chronic infarcts within the bilateral cerebellum. Mild generalized parenchymal atrophy. Vascular: Reported separately Skull: Normal. Negative for fracture or focal lesion. Sinuses/Orbits: Visualized orbits demonstrate no acute abnormality. No significant paranasal sinus disease or mastoid effusion ASPECTS ([place] Program Early CT Score) - Ganglionic level infarction (caudate, lentiform nuclei, internal capsule, insula, M1-M3 cortex): 6 - Supraganglionic infarction (M4-M6 cortex): 3 Total score (0-10 with 10 being normal): 9 Review of the MIP images confirms the above findings CTA NECK FINDINGS Aortic arch: Standard branching. Scattered soft and calcified plaque within the visualized aortic arch and proximal major branch vessels of the neck. Right carotid system: CCA and ICA widely patent within the neck without measurable stenosis. Left carotid system: CCA and  ICA widely patent within the neck without measurable stenosis. Vertebral arteries: Cervical vertebral arteries patent bilaterally without significant stenosis (50% or greater). Skeleton: No acute bony abnormality. Cervical spondylosis with multilevel posterior disc osteophytes, uncovertebral and facet hypertrophy. Severe C5-C6 intervertebral disc height loss with possible bony ankylosis. Other neck: No soft tissue neck mass or pathologically enlarged cervical chain lymph nodes. Thyroid negative. Upper chest: No consolidation within the imaged lung apices. Review of the MIP images confirms  the above findings CTA HEAD FINDINGS Anterior circulation: The intracranial internal carotid arteries are patent bilaterally. Calcified plaque within the cavernous/paraclinoid segments with regions of mild stenosis. 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 The M1 right middle cerebral artery is patent without significant stenosis. There is segmental occlusion of a proximal M2 middle cerebral artery branch (series 6, images 212-214). There is some reconstitution of flow more distally within this vessel. The bilateral anterior cerebral arteries and left middle cerebral artery are patent without significant proximal stenosis. Posterior circulation: The intracranial vertebral arteries are patent. Calcified plaque within the intracranial left vertebral artery with focal mild/moderate stenosis. The basilar artery is patent without significant stenosis. The bilateral posterior cerebral arteries are patent. Moderate focal stenosis within the P2 right posterior cerebral artery (series 9, image 42). Posterior communicating arteries are present bilaterally. Venous sinuses: Within limitations of contrast timing, no convincing thrombus. Review of the MIP images confirms the above findings CT Brain Perfusion Findings: ASPECTS: 9 CBF (<30%) Volume: 0 mL identified  Perfusion (Tmax>6.0s) volume: 60 mL (predominantly within the right MCA vascular territory). Mismatch Volume: 60 mL Infarction Location:Right MCA vascular territory. These results were called by telephone at the time of interpretation on 12/27/2018 at 11:49 am to provider Dr. Joni Fears, who verbally acknowledged these results. IMPRESSION: CT head: 1. Abnormal hypodensity within the right insula consistent with acute infarction change. ASPECTS 9. 2. Generalized parenchymal atrophy with chronic small vessel ischemic disease. Chronic infarcts within the bilateral cerebellum. CTA neck: Common carotid, internal carotid and vertebral arteries patent within the neck bilaterally without significant stenosis. CTA head: 1. 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 Some reconstitution of flow is seen more distally within this vessel. 2. Additional intracranial atherosclerotic stenoses, most notably as follows. Mild/moderate focal stenosis within the intracranial left vertebral artery. Moderate focal stenosis within the P2 right posterior cerebral artery. 3. 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 CT perfusion head: No core infarct is identified by the perfusion software. However, definite acute infarction changes are demonstrated within the right insula on concurrent noncontrast head CT. ASPECTS 9. 60 mL region of hypoperfused parenchyma detected predominantly within the right MCA vascular territory. Reported mismatch 60 mL. Electronically Signed   By: Kellie Simmering DO   On: 12/27/2018 11:58   Portable Chest X-ray  Result Date: 12/27/2018 CLINICAL DATA:  Endotracheal placement. EXAM: PORTABLE CHEST 1 VIEW COMPARISON:  Earlier same day FINDINGS: Endotracheal tube tip is 3 cm above the carina. Previous median sternotomy and CABG. Mild chronic cardiomegaly. The lungs are clear. The vascularity is normal. Chronic degenerative changes of the shoulders. IMPRESSION:  Endotracheal tube well positioned 3 cm above the carina. Cardiomegaly. Previous CABG. No active process evident otherwise. Electronically Signed   By: Nelson Chimes M.D.   On: 12/27/2018 17:35   Dg Hip Unilat W Or Wo Pelvis 2-3 Views Left  Result Date: 12/27/2018 CLINICAL DATA:  Left hip pain after unwitnessed fall. EXAM: DG HIP (WITH OR WITHOUT PELVIS) 2-3V LEFT COMPARISON:  None. FINDINGS: There is no evidence of hip fracture or dislocation. There is no evidence of arthropathy or other focal bone abnormality. IMPRESSION: Negative. Electronically Signed   By: Marijo Conception M.D.   On: 12/27/2018 12:04    Assessment/Plan: S/P RT common carotid arteriogram followed by partial revacularization of occluded RT MCA dominant inf division with x 2 passes with solitaire 82m x  40 mm x retiever device achieving a TICI 2b revascularization NIR following.    LOS: 1 day   I spent a total of 15 minutes in face to face in clinical consultation, greater than 50% of which was counseling/coordinating care for (R)CVA s/p intervention  Ascencion Dike PA-C 12/28/2018 10:05 AM

## 2018-12-28 NOTE — Evaluation (Addendum)
Clinical/Bedside Swallow Evaluation Patient Details  Name: Alexis Haas MRN: [id number] Date of Birth: 09/02/1917  Today's Date: 12/28/2018 Time: SLP Start Time (ACUTE ONLY): 27 SLP Stop Time (ACUTE ONLY): 1605 SLP Time Calculation (min) (ACUTE ONLY): 35 min  Past Medical History:  Past Medical History:  Diagnosis Date  . Anemia   . Arthritis   . Cancer ([place]) 04/15/2016   T3, N1a adenocarcinoma of the hepatic flexure. No mismatch repair gene abnormality.   . Chronic kidney disease    LABS 3/18 COMPARED WITH 1 YEAR AGO  . Coronary artery disease   . Fall 2018  . GERD (gastroesophageal reflux disease)   . Heart disease   . Hypercholesteremia   . Hypertension   . Insomnia    Past Surgical History:  Past Surgical History:  Procedure Laterality Date  . CARDIAC SURGERY  2005   bypass  . COLONOSCOPY WITH PROPOFOL N/A 04/15/2016   Procedure: COLONOSCOPY WITH PROPOFOL;  Surgeon: Alexis Lame, MD;  Location: [facility] ENDOSCOPY;  Service: Endoscopy;  Laterality: N/A;  . CORONARY ARTERY BYPASS GRAFT  2005   x 2  . ESOPHAGOGASTRODUODENOSCOPY (EGD) WITH PROPOFOL N/A 04/15/2016   Procedure: ESOPHAGOGASTRODUODENOSCOPY (EGD) WITH PROPOFOL;  Surgeon: Alexis Lame, MD;  Location: [facility] ENDOSCOPY;  Service: Endoscopy;  Laterality: N/A;  . LAPAROSCOPIC RIGHT COLECTOMY Right 06/05/2016   Procedure: LAPAROSCOPIC RIGHT COLECTOMY;  Surgeon: Alexis Bellow, MD;  Location: [facility] ORS;  Service: General;  Laterality: Right;  . RADIOLOGY WITH ANESTHESIA N/A 12/27/2018   Procedure: CODE STROKE;  Surgeon: Radiologist, Medication, MD;  Location: [place];  Service: Radiology;  Laterality: N/A;  . toe removal    . TONSILLECTOMY     age 85  . UMBILICAL HERNIA REPAIR  06/05/2016   Procedure: HERNIA REPAIR UMBILICAL ADULT;  Surgeon: Alexis Bellow, MD;  Location: [facility] ORS;  Service: General;;   HPI:  85yo female admitted 12/27/2018 after being found down by her son, with dysarthria and left side weakness. PMH:  CAD, GERD, HTN, CKD.   Assessment / Plan / Recommendation Clinical Impression  RN contacted SLP to request BSE s/p extubation this morning. Pt awake and alert upon arrival of SLP. Daughter present. Pt's oral cavity was noted to be significantly dry. Pt is edentulous, dentures at home. Oral care was completed with suction. Strong volitional cough. Pt accepted trials of ice chips, puree, nectar thick liquid, and thin liquid. Good oral control without anterior leakage or residue on any consistency. Pt passed [facility] swallow screen without difficulty. Solid textures not assessed at this time, given pt level of fatigue and edentulousness. Will begin puree diet and thin liquids, and recommend meds whole in puree for now. Anticipate being able to advance solids with improvement in strength and endurance, and with dentures in place. safe swallow precautions posted at [facility] and reviewed with pt/daughter. SLP will follow acutely for diet tolerance and readiness to advance.   Pt quite fatigued at this time. Will complete cognitive linguistic evaluation next date.    SLP Visit Diagnosis: Dysphagia, unspecified (R13.10)    Aspiration Risk  Mild aspiration risk    Diet Recommendation Dysphagia 1 (Puree);Thin liquid   Liquid Administration via: Cup;Straw Medication Administration: Whole meds with puree Supervision: Staff to assist with self feeding;Full supervision/cueing for compensatory strategies Compensations: Small sips/bites;Slow rate Postural Changes: Seated upright at 90 degrees;Remain upright for at least 30 minutes after po intake    Other  Recommendations Oral Care Recommendations: Oral care BID Other Recommendations: Have oral suction  available   Follow up Recommendations Other (comment)(TBD)      Frequency and Duration min 2x/week  1 week;2 weeks       Prognosis Prognosis for Safe Diet Advancement: Good      Swallow Study   General Date of Onset: 12/27/18 HPI: 85yo female admitted  12/27/2018 after being found down by her son, with dysarthria and left side weakness. PMH: CAD, GERD, HTN, CKD. Type of Study: Bedside Swallow Evaluation Previous Swallow Assessment: none Diet Prior to this Study: NPO Temperature Spikes Noted: No(low grade (99)) Respiratory Status: Nasal cannula History of Recent Intubation: Yes Length of Intubations (days): 1 days Date extubated: 12/28/18 Behavior/Cognition: Alert;Cooperative;Pleasant mood Oral Cavity Assessment: Dry Oral Care Completed by SLP: Yes Oral Cavity - Dentition: Dentures, not available;Edentulous Vision: Functional for self-feeding Self-Feeding Abilities: Needs assist;Needs set up Patient Positioning: Upright in bed Baseline Vocal Quality: Normal Volitional Cough: Strong Volitional Swallow: Able to elicit    Oral/Motor/Sensory Function Overall Oral Motor/Sensory Function: Within functional limits   Ice Chips Ice chips: Within functional limits Presentation: Spoon   Thin Liquid Thin Liquid: Within functional limits Presentation: Cup;Straw    Nectar Thick Nectar Thick Liquid: Within functional limits Presentation: Straw;Cup   Honey Thick Honey Thick Liquid: Not tested   Puree Puree: Within functional limits Presentation: Spoon   Solid     Solid: Not tested     Alexis Haas, [facility], [place] Pathologist Office: [phone] Pager: [phone]  Shonna Chock 12/28/2018,4:13 PM

## 2018-12-28 NOTE — Progress Notes (Signed)
  Echocardiogram 2D Echocardiogram has been performed.  Alexis Haas 12/28/2018, 12:37 PM

## 2018-12-28 NOTE — Progress Notes (Addendum)
NAME:  Alexis Haas, MRN:  [id number], DOB:  04/13/1917, LOS: 1 ADMISSION DATE:  12/27/2018, CONSULTATION DATE:  12/27/2018 REFERRING MD:  Dr. Estanislado Pandy, CHIEF COMPLAINT:  resp insufficiency   Brief History   83 yoF originally presenting to [facility] with left sided weakness and AMS found to have acute right M2 occlusion. LSW 11/15 around 1800.  Transferred to [facility] for endovascular repair with partial revascularization of R MCA.  COVID pending, therefore returns to ICU on mechanical ventilation.  History of present illness   HPI obtained from medical chart review as patient is sedated and intubated.   85 year old female with history of CAD, HTN, HLD with LSW 1800 on 12/26/2018.  Found on the floor this morning by her son with altered mental status and left sided weakness.  Presented to [facility] with NIHSS 6, found to have left weakness, left neglect, and visual deficit.  Found on CTA to have acute right MCA occlusion.   Outside of window for tPA.  Transferred to [facility] for endovascular repair.  Underwent EVR with partial revascularization of right MCA.  COVID test still pending, therefore patient to remain intubated and return to ICU on mechanical ventilation. PCCM consulted for vent management.    Past Medical History  CAD s/p CABG 2005, HTN, HLD, anemia, arthritis, CKD, GERD  Significant Hospital Events   11/16 tx [facility] to [facility]/ admitted / EVR  Consults:  NIR   Procedures:  11/16 EVR >> RT common carotid arteriogram followed by partial revacularization of occluded RT MCA dominant inf division with x 2 passes achieving a TICI 2b revascularization  11/16 ETT >> 11/17 11/16 Aline >>  Significant Diagnostic Tests:  11/16 CTH >> Atrophy with periventricular small vessel disease. Prior small infarct in the superior right cerebellum. No acute infarct evident.  No mass or hemorrhage.  There are foci of arterial vascular calcification. There is mucosal thickening in several ethmoid air cells.  11/16  CTA head>>  1. 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 Some reconstitution of flow is seen more distally within this vessel. 2. Additional intracranial atherosclerotic stenoses, most notably as follows. Mild/moderate focal stenosis within the intracranial left vertebral artery. Moderate focal stenosis within the P2 right posterior cerebral artery. 3. 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  11/16 CT Neck >> Common carotid, internal carotid and vertebral arteries patent within the neck bilaterally without significant stenosis.  11/16 CT Perfusion >> No core infarct is identified by the perfusion software. However, definite acute infarction changes are demonstrated within the right insula on concurrent noncontrast head CT. ASPECTS 9. 60 mL region of hypoperfused parenchyma detected predominantly within the right MCA vascular territory. Reported mismatch 60 mL.  Micro Data:  [facility] 11/16 SARs CoV2 >> Negative  Antimicrobials:  11/16 pre op cefazolin   Interim history/subjective:  Pt is on CPAP/PS this AM, pulling large volumes and very agitated. HR is 150+, and atrial fibrillation. Strong cough Plan is for extubation and heart rate  Control.    Objective   Blood pressure 91/74, pulse (!) 138, temperature 99.7 F (37.6 C), temperature source Axillary, resp. rate 19, SpO2 99 %.    Vent Mode: CPAP;PSV FiO2 (%):  [40 %] 40 % Set Rate:  [18 bmp] 18 bmp Vt Set:  [470 mL] 470 mL PEEP:  [5 cmH20] 5 cmH20 Pressure Support:  [5 cmH20] 5 cmH20 Plateau Pressure:  [14 cmH20-15 cmH20] 15 cmH20   Intake/Output Summary (  Last 24 hours) at 12/28/2018 0826 Last data filed at 12/28/2018 0700 Gross per 24 hour  Intake 2457.87 ml  Output 1890 ml  Net 567.87 ml   There were no vitals filed for this visit.  Examination: General:Agitated , following commands with Right side, answering questions Skin: NO rash or lesions, intact with  bruising noted HEENT:normocephalic, sclera clear, mucus membranes moist Neck:supple, no JVD, no bruits  Heart: S1, S2, Irr, no  murmur, gallup, rub or click Lungs: Bilateral chest excursion, coarse to clear with few crackles per bases, No  Wheezes noted [id number], non tender, + BS, Large hernia Ext:no lower ext edema, 2+ pedal pulses, 2+ radial pulses Neuro: Alert and answering questions, follows commands on right, weak on L,   Resolved Hospital Problem list    Assessment & Plan:   Acute right MCA stroke s/p EVR with partial revacularization P:  Per Neurology/ NIR Pending further stroke workup per Neurology  SBP goal 120-140  Ongoing neuro exams  MRI brain when able    Acute respiratory insufficiency related above COVID ruled out Extubated 11/17 am P:  Titrate oxygen to maintain sats > 94% CXR in am  ABG prn Minimize sedation Aggressive pulmonary toilet as able HOB elevated  OOB when ok per neuro Will need swallow eval   New onset Atrial Fibrillation with ectopy EKG with Mobitz type 1 AV block Plan Lopressor 5 mg now  Will add prn Lopressor Check Mag, Goal > 2 Maintain K > 4 EKG now EKG in am Echo   AKI  Creatinine continues to rise ( 1.40 on 11/17) GFR 31 P:  Continue Gentle hydration Trend BMP / urinary output Replace electrolytes as indicated Avoid nephrotoxic agents, ensure adequate renal perfusion   Hypotension>> resolving as sedation is off Hx CAD, HTN, HLD P:  Hypotension likely related sedation Neo prn SBP goal 120-140 Hold home HTN meds/ ASA for now   Hyperglycemia P:  CBG q 4 Will add SSI, sensitive scale   Leukocytosis - UA with trace leukocytes, few bacteria, WBC 21-50 P:  Follow  urine culture/ micro Ceftriaxone for 3 days ( Day 2) Trend WBC/ fever curve  Culture as is clinically indicated  Best practice:  Diet: NPO for now Pain/Anxiety/Delirium protocol (if indicated): prn fentanyl/ precedex VAP protocol (if indicated):  NA DVT prophylaxis: SCDs for now GI prophylaxis: PPI Glucose control: CBG q 4/ Sensitive scale SSI Mobility: BR, OOB to chair per neuro Code Status: Full  Family Communication: per primary  Disposition: 4N ICU  Labs   CBC: Recent Labs  Lab 12/27/18 0943 12/27/18 1811 12/28/18 0354  WBC 12.2*  --  13.7*  NEUTROABS 10.5*  --  11.8*  HGB 15.6* 12.2 13.2  HCT 45.2 36.0 38.8  MCV 85.0  --  89.2  PLT 211  --  123456    Basic Metabolic Panel: Recent Labs  Lab 12/27/18 0943 12/27/18 1811 12/28/18 0354  NA 136 136 137  K 3.8 3.2* 3.7  CL 99  --  103  CO2 22  --  18*  GLUCOSE 140*  --  169*  BUN 31*  --  21  CREATININE 1.25*  --  1.40*  CALCIUM 9.5  --  7.9*   GFR: Estimated Creatinine Clearance: 23 mL/min (A) (by C-G formula based on SCr of 1.4 mg/dL (H)). Recent Labs  Lab 12/27/18 0943 12/28/18 0354  WBC 12.2* 13.7*    Liver Function Tests: Recent Labs  Lab 12/27/18 0943  AST 33  ALT 17  ALKPHOS 55  BILITOT 1.0  PROT 7.6  ALBUMIN 4.4   No results for input(s): LIPASE, AMYLASE in the last 168 hours. No results for input(s): AMMONIA in the last 168 hours.  ABG    Component Value Date/Time   PHART 7.378 12/27/2018 1811   PCO2ART 32.5 12/27/2018 1811   PO2ART 140.0 (H) 12/27/2018 1811   HCO3 19.1 (L) 12/27/2018 1811   TCO2 20 (L) 12/27/2018 1811   ACIDBASEDEF 5.0 (H) 12/27/2018 1811   O2SAT 99.0 12/27/2018 1811     Coagulation Profile: Recent Labs  Lab 12/27/18 0943  INR 0.9    Cardiac Enzymes: No results for input(s): CKTOTAL, CKMB, CKMBINDEX, TROPONINI in the last 168 hours.  HbA1C: Hgb A1c MFr Bld  Date/Time Value Ref Range Status  12/28/2018 03:54 AM 6.1 (H) 4.8 - 5.6 % Final    Comment:    (NOTE) Pre diabetes:          5.7%-6.4% Diabetes:              >6.4% Glycemic control for   <7.0% adults with diabetes     CBG: Recent Labs  Lab 12/27/18 2324 12/28/18 0331 12/28/18 0801  GLUCAP 166* 182* 161*   Allergies Allergies   Allergen Reactions  . Amoxicillin Rash    Has patient had a PCN reaction causing immediate rash, facial/tongue/throat swelling, SOB or lightheadedness with hypotension:Yes Has patient had a PCN reaction causing severe rash involving mucus membranes or skin necrosis:unsure Has patient had a PCN reaction that required hospitalization:No Has patient had a PCN reaction occurring within the last 10 years:No If all of the above answers are "NO", then may proceed with Cephalosporin use.      Home Medications  Prior to Admission medications   Medication Sig Start Date End Date Taking? Authorizing Provider  aspirin EC 81 MG tablet Take 81 mg by mouth every 4 (four) hours as needed.     [provider]  ferrous sulfate 325 (65 FE) MG tablet Take 325 mg by mouth daily with breakfast.    [provider]  metoprolol tartrate (LOPRESSOR) 25 MG tablet Take 25 mg by mouth daily.     [provider]  naproxen sodium (ANAPROX) 220 MG tablet Take 220 mg by mouth daily as needed (for pain).     [provider]  omeprazole (PRILOSEC) 20 MG capsule Take 20 mg by mouth at bedtime.     [provider]  triamterene-hydrochlorothiazide (MAXZIDE-25) 37.5-25 MG tablet Take 1 tablet by mouth daily.    [provider]     Critical care time: 64 mins      Magdalen Spatz, MSN, AGACNP-BC [place] Pager # [phone] After 4 pm please call [phone] 12/28/2018 8:26 AM

## 2018-12-28 NOTE — Progress Notes (Signed)
Critical care attending attestation note:  Patient seen and examined and relevant ancillary tests reviewed.  I agree with the assessment and plan of care as outlined by Eric Form, NP. This patient was not seen as a shared visit. The following reflects my independent critical care time.   Synopsis of assessment and plan:  85 year old woman status post partial revascularization TICI2B of occluded right MCA for Left-sided weakness.  Critically ill due to respiratory failure requiring mechanical ventilation. She was awake and following commands with good strength and strong cough and passed SBT this morning.  She was successfully extubated.  On my examination this afternoon: She is awake and appropriately interactive.  Her speech is somewhat dysarthric and confused.  She is able to follow simple commands.  Strength is 4/5 on the right side and 3/5 on the left.  Heart sounds are unremarkable.  Chest is clear.  Echocardiogram performed today shows normal left ventricular function with no significant valvular abnormalities.  There is LVH.  Left atrial size was apparently normal.  Assessment:  Acute cortical CVA in the context of atrial fibrillation likely cardioembolic.  Now extubated with at least partial improvement following percutaneous intervention.   Daily Goals Checklist  Pain/Anxiety/Delirium protocol (if indicated): Limit opioid medications.  At risk for delirium.  As needed haloperidol. Respiratory support goals: On room air.  Progressive ambulation. Blood pressure target: Systolic blood pressure less than 160. DVT prophylaxis: Unfractionated heparin twice daily Nutritional status and feeding goals: Low nutritional risk.  For modified barium study tomorrow. GI prophylaxis: Home PPI Fluid status goals: Allow autoregulation. Urinary catheter: External urinary catheter Central lines: No central lines.  Groin procedure site intact Glucose control: Currently euglycemic on sliding  scale insulin Mobility/therapy needs: Progressive mobilization with physiotherapy Antibiotic de-escalation: No antibiotics Home medication reconciliation: On home Maxzide Daily labs: BMP to follow renal function following dye load Code Status: Currently full code, probably needs MOLST form prior to discharge Family Communication: Daughter updated at bedside Disposition: Transfer to floor tomorrow.   CRITICAL CARE Performed by: Kipp Brood   Total critical care time: 40 minutes  Critical care time was exclusive of separately billable procedures and treating other patients.  Critical care was necessary to treat or prevent imminent or life-threatening deterioration.  Critical care was time spent personally by me on the following activities: development of treatment plan with patient and/or surrogate as well as nursing, discussions with consultants, evaluation of patient's response to treatment, examination of patient, obtaining history from patient or surrogate, ordering and performing treatments and interventions, ordering and review of laboratory studies, ordering and review of radiographic studies, pulse oximetry, re-evaluation of patient's condition and participation in multidisciplinary rounds.  Kipp Brood, MD [facility] ICU Physician [place]  Pager: [phone] Mobile: [phone] After hours: [phone].  12/28/2018, 4:45 PM

## 2018-12-28 NOTE — Progress Notes (Signed)
STROKE TEAM PROGRESS NOTE   INTERVAL HISTORY Pt was extubated this am. Tolerating well so far, still has mild agitation. Still has right gaze, left hemiparesis but orientated x 3 with moderate dysarthria. On IVF.   Vitals:   12/28/18 0400 12/28/18 0500 12/28/18 0600 12/28/18 0700  BP: 137/76 134/68 (!) 121/58 (!) 94/46  Pulse: 91 73 99 74  Resp: 18 18 16 18   Temp: 99 F (37.2 C)     TempSrc: Axillary     SpO2: 99% 99% 99% 99%    CBC:  Recent Labs  Lab 12/27/18 0943 12/27/18 1811 12/28/18 0354  WBC 12.2*  --  13.7*  NEUTROABS 10.5*  --  11.8*  HGB 15.6* 12.2 13.2  HCT 45.2 36.0 38.8  MCV 85.0  --  89.2  PLT 211  --  123456    Basic Metabolic Panel:  Recent Labs  Lab 12/27/18 0943 12/27/18 1811 12/28/18 0354  NA 136 136 137  K 3.8 3.2* 3.7  CL 99  --  103  CO2 22  --  18*  GLUCOSE 140*  --  169*  BUN 31*  --  21  CREATININE 1.25*  --  1.40*  CALCIUM 9.5  --  7.9*   Lipid Panel:     Component Value Date/Time   CHOL 186 12/28/2018 0354   TRIG 120 12/28/2018 0354   HDL 44 12/28/2018 0354   CHOLHDL 4.2 12/28/2018 0354   VLDL 24 12/28/2018 0354   LDLCALC 118 (H) 12/28/2018 0354   HgbA1c:  Lab Results  Component Value Date   HGBA1C 6.1 (H) 12/28/2018   Urine Drug Screen:     Component Value Date/Time   LABOPIA NONE DETECTED 12/27/2018 1023   COCAINSCRNUR NONE DETECTED 12/27/2018 1023   LABBENZ NONE DETECTED 12/27/2018 1023   AMPHETMU NONE DETECTED 12/27/2018 1023   THCU NONE DETECTED 12/27/2018 1023   LABBARB NONE DETECTED 12/27/2018 1023    Alcohol Level     Component Value Date/Time   ETH <10 12/27/2018 0943    IMAGING  MRI/MRA pending  Cerebral Angio 12/27/2018 1116 RT common carotid arteriogram followed by partial revacularization of occluded RT MCA dominant inf division with x 2 passes with solitaire 64mm x 40 mm x retiever device achieving a TICI 2b revascularization  CT head 12/27/2018 1116 1. Abnormal hypodensity within the right  insula consistent with acute infarction change. ASPECTS 9. 2. Generalized parenchymal atrophy with chronic small vessel ischemic disease. Chronic infarcts within the bilateral cerebellum. CTA neck: Common carotid, internal carotid and vertebral arteries patent within the neck bilaterally without significant stenosis.   Ct Angio Head W Or Wo Contrast 12/27/2018 1116 1. 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 Some reconstitution of flow is seen more distally within this vessel. 2. Additional intracranial atherosclerotic stenoses, most notably as follows. Mild/moderate focal stenosis within the intracranial left vertebral artery. Moderate focal stenosis within the P2 right posterior cerebral artery. 3. 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   Ct Angio Neck W Or Wo Contrast 12/27/2018 1116 Common carotid, internal carotid and vertebral arteries patent within the neck bilaterally without significant stenosis.  Ct Cerebral Perfusion W Contrast 12/27/2018 1116 No core infarct is identified by the perfusion software. However, definite acute infarction changes are demonstrated within the right insula on concurrent noncontrast head CT. ASPECTS 9. 60 mL region of hypoperfused parenchyma detected predominantly within the right MCA vascular territory. Reported mismatch 60 mL.  Ct Head Wo Contrast 12/27/2018 1000 Atrophy with periventricular small vessel disease. Prior small infarct in the superior right cerebellum. No acute infarct evident. No mass or hemorrhage. There are foci of arterial vascular calcification. There is mucosal thickening in several ethmoid air cells.   Dg Chest 1 View 12/27/2018 1130 No active disease.   Portable Chest X-ray 12/27/2018 1718 Endotracheal tube well positioned 3 cm above the carina. Cardiomegaly. Previous CABG. No active process evident otherwise.   Dg Hip Unilat W Or Wo Pelvis 2-3 Views  Left 12/27/2018 1130 Negative.    PHYSICAL EXAM  Temp:  [97.4 F (36.3 C)-99.7 F (37.6 C)] 99.7 F (37.6 C) (11/17 0800) Pulse Rate:  [47-146] 51 (11/17 0900) Resp:  [16-33] 16 (11/17 0900) BP: (91-162)/(41-104) 101/41 (11/17 0900) SpO2:  [89 %-100 %] 97 % (11/17 0900) Arterial Line BP: (50-159)/(43-82) 50/43 (11/17 0700) FiO2 (%):  [40 %] 40 % (11/17 0811)  General - Well nourished, well developed, mildly agitated in bed.  Ophthalmologic - fundi not visualized due to noncooperation.  Cardiovascular - mild tachycardia, with skipping beats. EEG showed grade II AV block.   Neuro - mildly agitated, lethargic and drowsy but arousable and able to answer orientated questions appropriately. Orientated to her name, age, place, paucity of speech but following most simple commands. Moderate dysarthria. Right gaze preference, not able to cross midline. Not blinking to visual threat bilaterally. Left nasolabial fold flattening. RUE 5/5 and purposeful movement, strong, LUE 2/5 not able to hold against gravity. LLE 3/5 and RLE 4/5. Sensation light touch symmetrical subjectively. Coordination not cooperative and gait not tested.    ASSESSMENT/PLAN Alexis Haas is a 85 y.o. female with history of HTN, HLD CAD found down by son presenting with L sided weakness, neglect and anosognosia. To IR for R M2 occlusion w/ TICI2b revascularization.  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 Old superior R cerebellar infarct.  CT head hypodensity R insula. Small vessel disease. Atrophy. Old bilateral cerebellar infarcts. ASPECTS 9    CTA head proximal R M2 occlusion. Additional intracranial atherosclerosis (mild/mod L VA, moderate R P2). 50mm paraclinoid L ICA aneurysm  CTA neck Unremarkable   CT perfusion no core infarct. R insular penumbra w/ 60 mL mismatch  Cerebral angio showed right M2  occlusion s/p TICI2b revascularization  MRI  pending  MRA  Pending   2D Echo pending   LDL 118  HgbA1c 6.1  SCDs for VTE prophylaxis  aspirin 81 mg daily prior to admission, now on aspirin 300 mg suppository daily. Will consider anticoagulation based on MRI result.   Therapy recommendations:  pending   Disposition:  pending   Acute Respiratory Failure  Intubated for IR  Left intubated post IR as COVID pending   Covid now resulted negative  Sedation weaned Extubated this am  CCM on board  Tolerating well, on [place]  Atrial Fibrillation w/ ectopy, new onset  Home anticoagulation:  none   CHA2DS2-VASc Score = 7, ?2 oral anticoagulation recommended  Age in Years:  ?89   +2    Sex:  Female   Female   +1    Hypertension History:  yes   +1     Diabetes Mellitus:  0  Congestive Heart Failure History:  0  Vascular Disease History:  yes   +1     Stroke/TIA/Thromboembolism History:  yes   +  2 . Consider AC based on MRI result once able to swallow and continue at discharge   Hypotension Hx Hypertension  Home meds:  Metoprolol 25, triamterene-HCTZ 37.5-25  Stable on the low side, improved off sedation  Initially on cleviprex, now off BP goal per IR x 24h following IR . Long-term BP goal normotensive  Hyperlipidemia  Home meds:  No statin  LDL 118, goal < 70  Consider low dose statin once able to swallow  Continue statin at discharge  Dysphagia . Secondary to stroke . NPO . Speech on board . Consider TF if not passing swallow   UTI with leukocystosis  Leukocytosis. WBC 12.2->13.7.   UA w/ trace leukocytoes, few bacteria, WBC 21-50.   UCx pending.   Rocephin D#2/3.  Other Stroke Risk Factors  Advanced age  Coronary artery disease s/p CABG  Other Active Problems  Adenocarcinoma of the hepatic flexure  GERD on PPI  AKI. Cre 1.25->1.4. Gentle hydration on IVF @50   Hospital day # 1  This patient is critically ill due to right MCA infarct, s/p  thrombectomy, new onset AF, dysaphagia and at significant risk of neurological worsening, death form recurrent stroke, hemorrhagic conversion, heart failure, aspiration pneumonia. This patient's care requires constant monitoring of vital signs, hemodynamics, respiratory and cardiac monitoring, review of multiple databases, neurological assessment, discussion with family, other specialists and medical decision making of high complexity. I spent 45 minutes of neurocritical care time in the care of this patient. I had long discussion with daughter and son over the phone, updated pt current condition, treatment plan and potential prognosis, and answered all the questions. They expressed understanding and appreciation.    Rosalin Hawking, MD PhD Stroke Neurology 12/28/2018 11:08 AM   To contact Stroke Continuity provider, please refer to [email or web address] After hours, contact General Neurology

## 2018-12-28 NOTE — Progress Notes (Deleted)
Attempted echo.  Patient wants to spend time w/ daughter until 2pm.  Will try later

## 2018-12-28 NOTE — Progress Notes (Signed)
OT Cancellation Note  Patient Details Name: Alexis Haas MRN: [id number] DOB: 1917-02-20   Cancelled Treatment:    Reason Eval/Treat Not Completed: Active bedrest order Pt with active bed rest orders, just extubated. Will hold at this time and continue to follow as available and appropriate to initiate OT POC.  Zenovia Jarred, MSOT, OTR/L Behavioral Health OT/ Acute Relief OT [facility] Office: [phone]   Zenovia Jarred 12/28/2018, 9:46 AM

## 2018-12-28 NOTE — Procedures (Signed)
Extubation Procedure Note  Patient Details:   Name: [name] DOB: [birth year] MRN: [id number]   Airway Documentation:    Vent end date: 12/28/18 Vent end time: 0829   Evaluation  O2 sats: stable throughout Complications: No apparent complications Patient did tolerate procedure well. Bilateral Breath Sounds: Clear, Diminished   Yes   Pt extubated to 4L N/C.  No stridor noted.  RN @ bedside.  Donnetta Hail 12/28/2018, 8:30 AM

## 2018-12-28 NOTE — Progress Notes (Signed)
PT Cancellation Note  Patient Details Name: Alexis Haas MRN: [id number] DOB: 04/07/1917   Cancelled Treatment:    Reason Eval/Treat Not Completed: Patient not medically ready. Pt just extubated and remains on bed rest. PT to return as able, as appropriate to complete PT eval.  Kittie Plater, PT, DPT Acute Rehabilitation Services Pager #: [phone] Office #: [phone]    Berline Lopes 12/28/2018, 8:59 AM

## 2018-12-29 ENCOUNTER — Inpatient Hospital Stay (HOSPITAL_COMMUNITY): Payer: Medicare HMO

## 2018-12-29 DIAGNOSIS — I639 Cerebral infarction, unspecified: Secondary | ICD-10-CM

## 2018-12-29 LAB — CBC
HCT: 37.2 % (ref 36.0–46.0)
Hemoglobin: 12.2 g/dL (ref 12.0–15.0)
MCH: 30.3 pg (ref 26.0–34.0)
MCHC: 32.8 g/dL (ref 30.0–36.0)
MCV: 92.3 fL (ref 80.0–100.0)
Platelets: 140 10*3/uL — ABNORMAL LOW (ref 150–400)
RBC: 4.03 MIL/uL (ref 3.87–5.11)
RDW: 14 % (ref 11.5–15.5)
WBC: 8.3 10*3/uL (ref 4.0–10.5)
nRBC: 0 % (ref 0.0–0.2)

## 2018-12-29 LAB — GLUCOSE, CAPILLARY
Glucose-Capillary: 87 mg/dL (ref 70–99)
Glucose-Capillary: 86 mg/dL (ref 70–99)
Glucose-Capillary: 129 mg/dL — ABNORMAL HIGH (ref 70–99)
Glucose-Capillary: 116 mg/dL — ABNORMAL HIGH (ref 70–99)
Glucose-Capillary: 151 mg/dL — ABNORMAL HIGH (ref 70–99)

## 2018-12-29 LAB — URINE CULTURE: Culture: 70000 — AB

## 2018-12-29 LAB — BASIC METABOLIC PANEL
Anion gap: 11 (ref 5–15)
BUN: 26 mg/dL — ABNORMAL HIGH (ref 8–23)
CO2: 21 mmol/L — ABNORMAL LOW (ref 22–32)
Calcium: 7.9 mg/dL — ABNORMAL LOW (ref 8.9–10.3)
Chloride: 107 mmol/L (ref 98–111)
Creatinine, Ser: 1.32 mg/dL — ABNORMAL HIGH (ref 0.44–1.00)
GFR calc Af Amer: 39 mL/min — ABNORMAL LOW (ref >60)
GFR calc non Af Amer: 33 mL/min — ABNORMAL LOW (ref >60)
Glucose, Bld: 90 mg/dL (ref 70–99)
Potassium: 3.3 mmol/L — ABNORMAL LOW (ref 3.5–5.1)
Sodium: 139 mmol/L (ref 135–145)

## 2018-12-29 LAB — MAGNESIUM: Magnesium: 2.2 mg/dL (ref 1.7–2.4)

## 2018-12-29 MED ORDER — INSULIN ASPART 100 UNIT/ML ~~LOC~~ SOLN
0.0000 [IU] | Freq: Three times a day (TID) | SUBCUTANEOUS | Status: DC
  Administered 2018-12-30: 1 [IU] via SUBCUTANEOUS

## 2018-12-29 MED ORDER — FERROUS SULFATE 325 (65 FE) MG PO TABS
325.0000 mg | ORAL_TABLET | Freq: Every day | ORAL | Status: DC
  Administered 2018-12-29 – 2019-01-03 (×6): 325 mg via ORAL
  Filled 2018-12-29 (×6): qty 1

## 2018-12-29 MED ORDER — POTASSIUM CHLORIDE 20 MEQ PO PACK
40.0000 meq | PACK | ORAL | Status: AC
  Administered 2018-12-29 (×2): 40 meq via ORAL
  Filled 2018-12-29 (×2): qty 2

## 2018-12-29 MED ORDER — HYDRALAZINE HCL 20 MG/ML IJ SOLN: 10.0000 mg | INTRAMUSCULAR | Status: DC | PRN

## 2018-12-29 MED ORDER — ATORVASTATIN CALCIUM 10 MG PO TABS
20.0000 mg | ORAL_TABLET | Freq: Every day | ORAL | Status: DC
  Administered 2018-12-29 – 2019-01-02 (×5): 20 mg via ORAL
  Filled 2018-12-29 (×5): qty 2

## 2018-12-29 MED ORDER — LABETALOL HCL 5 MG/ML IV SOLN: 5.0000 mg | INTRAVENOUS | Status: DC | PRN

## 2018-12-29 MED ORDER — SODIUM CHLORIDE 0.9 % IV SOLN
2.0000 g | INTRAVENOUS | Status: DC
  Filled 2018-12-29: qty 20

## 2018-12-29 MED ORDER — FOSFOMYCIN TROMETHAMINE 3 G PO PACK
3.0000 g | PACK | Freq: Once | ORAL | Status: AC
  Administered 2018-12-29: 3 g via ORAL
  Filled 2018-12-29: qty 3

## 2018-12-29 MED ORDER — ENOXAPARIN SODIUM 30 MG/0.3ML ~~LOC~~ SOLN
30.0000 mg | SUBCUTANEOUS | Status: DC
  Administered 2018-12-29 – 2018-12-31 (×3): 30 mg via SUBCUTANEOUS
  Filled 2018-12-29 (×3): qty 0.3

## 2018-12-29 MED ORDER — PANTOPRAZOLE SODIUM 40 MG PO TBEC
40.0000 mg | DELAYED_RELEASE_TABLET | Freq: Every day | ORAL | Status: DC
  Administered 2018-12-29 – 2019-01-03 (×6): 40 mg via ORAL
  Filled 2018-12-29 (×6): qty 1

## 2018-12-29 MED ORDER — SODIUM CHLORIDE 0.9 % IV SOLN
250.0000 mL | INTRAVENOUS | Status: DC
  Administered 2018-12-29 (×2): 250 mL via INTRAVENOUS

## 2018-12-29 NOTE — Evaluation (Signed)
Speech Language Pathology Evaluation Patient Details Name: Alexis Haas MRN: [id number] DOB: 01-26-1918 Today's Date: 12/29/2018 Time: 1041-1050 SLP Time Calculation (min) (ACUTE ONLY): 9 min  Problem List:  Patient Active Problem List   Diagnosis Date Noted  . Stroke (cerebrum) ([place]) 12/27/2018  . 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 Liver metastasis ([place]) 09/17/2018  . Elevated CEA 08/30/2018  . Goals of care, counseling/discussion 06/17/2017  . Hypocalcemia 03/21/2017  . Cancer of ascending colon ([place]) 06/05/2016  . Colon cancer, ascending ([place]) 05/21/2016  . Iron deficiency anemia due to chronic blood loss   . Acute gastric ulcer without hemorrhage or perforation   . Stenosis of intestine ([place])   . Weight loss 11/07/2015  . Iron deficiency anemia 01/10/2015   Past Medical History:  Past Medical History:  Diagnosis Date  . Anemia   . Arthritis   . Cancer ([place]) 04/15/2016   T3, N1a adenocarcinoma of the hepatic flexure. No mismatch repair gene abnormality.   . Chronic kidney disease    LABS 3/18 COMPARED WITH 1 YEAR AGO  . Coronary artery disease   . Fall 2018  . GERD (gastroesophageal reflux disease)   . Heart disease   . Hypercholesteremia   . Hypertension   . Insomnia    Past Surgical History:  Past Surgical History:  Procedure Laterality Date  . CARDIAC SURGERY  2005   bypass  . COLONOSCOPY WITH PROPOFOL N/A 04/15/2016   Procedure: COLONOSCOPY WITH PROPOFOL;  Surgeon: Lucilla Lame, MD;  Location: [facility] ENDOSCOPY;  Service: Endoscopy;  Laterality: N/A;  . CORONARY ARTERY BYPASS GRAFT  2005   x 2  . ESOPHAGOGASTRODUODENOSCOPY (EGD) WITH PROPOFOL N/A 04/15/2016   Procedure: ESOPHAGOGASTRODUODENOSCOPY (EGD) WITH PROPOFOL;  Surgeon: Lucilla Lame, MD;  Location: [facility] ENDOSCOPY;  Service: Endoscopy;  Laterality: N/A;  . LAPAROSCOPIC RIGHT COLECTOMY Right 06/05/2016   Procedure: LAPAROSCOPIC RIGHT COLECTOMY;  Surgeon: Robert Bellow, MD;  Location: [facility]  ORS;  Service: General;  Laterality: Right;  . RADIOLOGY WITH ANESTHESIA N/A 12/27/2018   Procedure: CODE STROKE;  Surgeon: Radiologist, Medication, MD;  Location: [facility];  Service: Radiology;  Laterality: N/A;  . toe removal    . TONSILLECTOMY     age 85  . UMBILICAL HERNIA REPAIR  06/05/2016   Procedure: HERNIA REPAIR UMBILICAL ADULT;  Surgeon: Robert Bellow, MD;  Location: [facility] ORS;  Service: General;;   HPI:  85 yo female admitted 12/27/2018 after being found down by her son, with dysarthria and left side weakness. PMH: CAD, GERD, HTN, CKD. MRI infarct in the right insula, scattered in the right frontal lobe; Chronic small cerebellar infarcts.    Assessment / Plan / Recommendation Clinical Impression  Pt's baseline cognitive status is unknown. She lived independently with sons checking in daily and daughter visited once a week to assist with finances. Pt reports she managed her medication independently with pill box. She was not oriented to situation but accurate with person, place and time (mostly). Basic problem solving appeared intact for immediate environment. Speech intelligible with trace dysarthria. Pt will need supervision for 24 hrs/day. Would not recommend ST while in acute care. Feel she will be functional given supervision.        SLP Assessment  SLP Recommendation/Assessment: Patient does not need any further Speech Lanaguage Pathology Services SLP Visit Diagnosis: Dysphagia, unspecified (R13.10);Cognitive communication deficit (R41.841)    Follow Up Recommendations  None    Frequency and Duration  SLP Evaluation Cognition  Overall Cognitive Status: No family/caregiver present to determine baseline cognitive functioning Arousal/Alertness: Awake/alert Orientation Level: Disoriented to situation;Oriented to person;Oriented to place;Oriented to time Attention: Sustained Sustained Attention: Appears intact Memory: (functional for basic) Awareness:  Impaired Awareness Impairment: Anticipatory impairment Problem Solving: Appears intact Safety/Judgment: Impaired       Comprehension  Auditory Comprehension Overall Auditory Comprehension: Appears within functional limits for tasks assessed Visual Recognition/Discrimination Discrimination: Not tested Reading Comprehension Reading Status: Not tested    Expression Expression Primary Mode of Expression: Verbal Verbal Expression Overall Verbal Expression: Appears within functional limits for tasks assessed Pragmatics: No impairment Written Expression Dominant Hand: Right Written Expression: Not tested   Oral / Motor  Oral Motor/Sensory Function Overall Oral Motor/Sensory Function: Within functional limits Motor Speech Overall Motor Speech: Other (comment)(trace dysarthria) Intelligibility: Intelligible Motor Planning: Witnin functional limits   GO                    Houston Siren 12/29/2018, 3:20 PM   Orbie Pyo Colvin Caroli.Ed Risk analyst [phone] Office [phone]

## 2018-12-29 NOTE — Evaluation (Signed)
Occupational Therapy Evaluation Patient Details Name: Alexis Haas MRN: [id number] DOB: 1917/06/24 Today's Date: 12/29/2018    History of Present Illness Pt is a 66 yoF originally presenting to [facility] with left sided weakness and AMS found to have acute right M2 occlusion. LSW 11/15 around 1800.  Transferred to [facility] for endovascular repair with partial revascularization of R MCA. PMHx: CAD s/p CABG 2005, HTN, HLD, anemia, CKD, arthritis.   Clinical Impression   Pt PTA: Pt living alone with family nearby checking on pt daily and assisting with IADL weekly. Pt reports independence with ADL and  Mobility with RW at home. Pt's DIL in room for most of  OT eval. Pt limited by poor strength, decreased activity tolerance and decreased ability to care for self. Bed mobility maxA+2 for sequencing and decreased strength. Pt requiring maxA +2 for sit to stand - unable to stand- knees flexed unable to come all the way to standing. VSS. HR did increase from 70 BPM to 106 BPM with exertion. Pt totalA for transfer, squat pivot to recliner. Advised staff to use stedy+2. Pt maxA overall for ADL tasks. Pt appears alert, but unaware of CVA and continues to become confused about her LUE weakness. Peripheral vision deficits noted. Pt would benefit from continued OT skilled services for ADL, mobility and LUE weakness. Pt would be an excellent candidate for CIR as pt is very motivated, per DIL, pt tolerated procedure x2 years ago well and "bounced right back." OT following acutely.       Follow Up Recommendations  CIR;Supervision/Assistance - 24 hour    Equipment Recommendations  Other (comment)(to be determined)    Recommendations for Other Services       Precautions / Restrictions Precautions Precautions: Fall Restrictions Weight Bearing Restrictions: No      Mobility Bed Mobility Overal bed mobility: Needs Assistance Bed Mobility: Rolling;Sidelying to Sit;Supine to Sit Rolling: Max assist;+2 for  physical assistance;+2 for safety/equipment Sidelying to sit: Max assist;+2 for physical assistance;+2 for safety/equipment Supine to sit: Max assist;+2 for physical assistance;+2 for safety/equipment     General bed mobility comments: Pt assisting with rolling and moving BLEs over to edge, but maxA +2 required for trunk elevation, moving BLEs, and scooting bottom to EOB.  Transfers Overall transfer level: Needs assistance Equipment used: Rolling walker (2 wheeled);None(sit to stand x3 times with RW and none for transfer) Transfers: Sit to/from [employer] Sit to Stand: Max assist;Total assist;+2 physical assistance;+2 safety/equipment;From elevated surface   Squat pivot transfers: Total assist     General transfer comment: Pt requiring maxA +2 for sit to stand - unable to stand- knees flexed unable to come all the way to standing.    Balance Overall balance assessment: Needs assistance Sitting-balance support: Bilateral upper extremity supported Sitting balance-Leahy Scale: Poor Sitting balance - Comments: Pt with posterior lean (has hernia per DIL, but pt requiring intermittent assist for upright sitting.     Standing balance-Leahy Scale: Poor Standing balance comment: unable to fully stand with knees flexed and LUE unable to grasp RW for stability.                           ADL either performed or assessed with clinical judgement   ADL Overall ADL's : Needs assistance/impaired Eating/Feeding: Minimal assistance;Sitting;Bed level Eating/Feeding Details (indicate cue type and reason): weakness in arms for feeding Grooming: Moderate assistance;Sitting;Bed level Grooming Details (indicate cue type and reason):  weakness in BUEs Upper  Body Bathing: Moderate assistance;Sitting;Bed level   Lower Body Bathing: Maximal assistance;Total assistance;+2 for physical assistance;+2 for safety/equipment;Sitting/lateral leans;Bed level   Upper Body Dressing :  Moderate assistance;Sitting   Lower Body Dressing: Maximal assistance;Total assistance;Cueing for sequencing;Sitting/lateral leans;Bed level   Toilet Transfer: Total assistance;+2 for physical assistance;+2 for safety/equipment;Squat-pivot Toilet Transfer Details (indicate cue type and reason): unable to stand upright Toileting- Clothing Manipulation and Hygiene: Maximal assistance;Total assistance;+2 for physical assistance;+2 for safety/equipment;Cueing for safety;Sitting/lateral lean;Bed level       Functional mobility during ADLs: Total assistance;+2 for physical assistance;+2 for safety/equipment;Cueing for safety;Cueing for sequencing General ADL Comments: Pt limited by poor strength, decreased activity tolerance and decreased ability to care for self.     Vision Baseline Vision/History: No visual deficits;Wears glasses Wears Glasses: At all times Patient Visual Report: No change from baseline Vision Assessment?: No apparent visual deficits     Perception Perception Perception Tested?: Yes Perception Deficits: Inattention/neglect Inattention/Neglect: Impaired- to be further tested in functional context Spatial deficits: deficits in peripheral vision; inattention to L side/LUE.   Praxis      Pertinent Vitals/Pain Pain Assessment: No/denies pain Faces Pain Scale: No hurt     Hand Dominance Right   Extremity/Trunk Assessment Upper Extremity Assessment Upper Extremity Assessment: Generalized weakness(left notably weaker than R UE) RUE Deficits / Details: 3-/5 MM grafe at shoulder; elbow through grip strength 3+/5 MM grade RUE Coordination: decreased fine motor LUE Deficits / Details: 2/5 MM grade, poor strength and decreased grip strength LUE Coordination: decreased fine motor;decreased gross motor   Lower Extremity Assessment Lower Extremity Assessment: Generalized weakness;Defer to PT evaluation   Cervical / Trunk Assessment Cervical / Trunk Assessment: Kyphotic    Communication Communication Communication: HOH   Cognition Arousal/Alertness: Awake/alert Behavior During Therapy: WFL for tasks assessed/performed Overall Cognitive Status: Within Functional Limits for tasks assessed                                     General Comments  VSS. HR did increase from 70 BPM to 106 BPM with exertion.    Exercises Exercises: Other exercises Other Exercises Other Exercises: AAROM shoulder through digits, 5 reps.   Shoulder Instructions      Home Living Family/patient expects to be discharged to:: Private residence Living Arrangements: Alone Available Help at Discharge: Family;Available PRN/intermittently Type of Home: House Home Access: Ramped entrance   Entrance Stairs-Rails: Can reach both Home Layout: One level     Bathroom Shower/Tub: Teacher, early years/pre: Standard     Home Equipment: Environmental consultant - 2 wheels;Toilet riser;Cane - single point   Additional Comments: SPC for mobility; Son daily check-in; Daughter groccery shops 1x weekly; no one spends the night      Prior Functioning/Environment Level of Independence: Independent        Comments: Pt reports independence with ADL (sponge bath)- SOn daily check-in; Daughter groccery shops 1x weekly; no one spends the night         OT Problem List: Decreased strength;Decreased activity tolerance;Impaired balance (sitting and/or standing);Decreased safety awareness;Increased edema;Impaired UE functional use;Decreased coordination;Impaired vision/perception      OT Treatment/Interventions: Self-care/ADL training;Therapeutic exercise;Neuromuscular education;Energy conservation;DME and/or AE instruction;Therapeutic activities;Visual/perceptual remediation/compensation;Patient/family education;Balance training    OT Goals(Current goals can be found in the care plan section) Acute Rehab OT Goals Patient Stated Goal: DIL wants pt to progress to mobility. OT Goal  Formulation: With patient Time For Goal  Achievement: 01/12/19 Potential to Achieve Goals: Good ADL Goals Pt Will Perform Grooming: with supervision;sitting Pt Will Perform Upper Body Dressing: with supervision;sitting Pt Will Perform Lower Body Dressing: with mod assist;sitting/lateral leans;sit to/from stand Pt Will Transfer to Toilet: with mod assist;stand pivot transfer;bedside commode Pt Will Perform Toileting - Clothing Manipulation and hygiene: with mod assist;sit to/from stand;sitting/lateral leans Pt/caregiver will Perform Home Exercise Program: Left upper extremity;Increased strength;With written HEP provided;With minimal assist  OT Frequency: Min 3X/week   Barriers to D/C:            Co-evaluation PT/OT/SLP Co-Evaluation/Treatment: Yes Reason for Co-Treatment: Complexity of the patient's impairments (multi-system involvement);To address functional/ADL transfers   OT goals addressed during session: ADL's and self-care      AM-PAC OT "6 Clicks" Daily Activity     Outcome Measure Help from another person eating meals?: A Lot Help from another person taking care of personal grooming?: A Lot Help from another person toileting, which includes using toliet, bedpan, or urinal?: Total Help from another person bathing (including washing, rinsing, drying)?: A Lot Help from another person to put on and taking off regular upper body clothing?: A Lot Help from another person to put on and taking off regular lower body clothing?: Total 6 Click Score: 10   End of Session Equipment Utilized During Treatment: Rolling walker Nurse Communication: Mobility status  Activity Tolerance: Patient tolerated treatment well Patient left: in chair;with call bell/phone within reach;with chair alarm set  OT Visit Diagnosis: Unsteadiness on feet (R26.81);Muscle weakness (generalized) (M62.81)                Time: [id number] OT Time Calculation (min): 47 min Charges:  OT General Charges $OT  Visit: 1 Visit OT Evaluation $OT Eval Moderate Complexity: 1 Mod  Darryl Nestle) Marsa Aris OTR/L Acute Rehabilitation Services Pager: [phone] Office: [phone] 12/29/2018, 1:45 PM

## 2018-12-29 NOTE — Progress Notes (Signed)
  Speech Language Pathology Treatment: Dysphagia  Patient Details Name: MARILLYN TESH MRN: [id number] DOB: 1917-08-16 Today's Date: 12/29/2018 Time: 1040-1150 SLP Time Calculation (min) (ACUTE ONLY): 70 min  Assessment / Plan / Recommendation Clinical Impression  Pt seen for safety with recommendations and ability to upgrade from puree. No upper denture plate available and pt eats without as well. She was able to masticate regular texture timely without residual. No s/s aspiration with solid or thin via straw through multiple sips. Recommend upgrade to Dys 2, continue thin, straws allowed, pills whole in applesauce. Will continue to follow for further upgrade and tolerance.    HPI HPI: 85 yo female admitted 12/27/2018 after being found down by her son, with dysarthria and left side weakness. PMH: CAD, GERD, HTN, CKD. MRI infarct in the right insula, scattered in the right frontal lobe; Chronic small cerebellar infarcts.       SLP Plan  Continue with current plan of care       Recommendations  Diet recommendations: Dysphagia 2 (fine chop);Thin liquid Liquids provided via: Cup;Straw Medication Administration: Whole meds with puree Supervision: Patient able to self feed Compensations: Small sips/bites;Slow rate Postural Changes and/or Swallow Maneuvers: Seated upright 90 degrees                Oral Care Recommendations: Oral care BID Follow up Recommendations: None SLP Visit Diagnosis: Dysphagia, unspecified (R13.10) Plan: Continue with current plan of care                      Houston Siren 12/29/2018, 12:31 PM   Orbie Pyo Colvin Caroli.Ed Risk analyst [phone] Office [phone]

## 2018-12-29 NOTE — Progress Notes (Signed)
Rehab Admissions Coordinator Note:  Per OT recommendation, this patient was screened by Raechel Ache for appropriateness for an Inpatient Acute Rehab Consult.  At this time, we are recommending Inpatient Rehab consult. AC will contact MD and request consult order.   Raechel Ache 12/29/2018, 4:46 PM  I can be reached at [phone].

## 2018-12-29 NOTE — Progress Notes (Addendum)
STROKE TEAM PROGRESS NOTE   INTERVAL HISTORY Pt RN at bedside. She is awake alert and interactive. Mild to moderate dysarthria but orientated. Still has mild weakness at left arm. Passed swallow on dys1 diet. But has not had breakfast yet. On minimal IVF, urine output was low. Will increase IVF and encourage po intake.   Vitals:   12/29/18 0600 12/29/18 0700 12/29/18 0800 12/29/18 0808  BP: (!) 126/51 (!) 117/52  (!) 143/57  Pulse: 67 60 76 69  Resp: 17 12 (!) 24 12  Temp:   (!) 97.3 F (36.3 C)   TempSrc:   Axillary   SpO2: 100% 99% 100% 100%    CBC:  Recent Labs  Lab 12/27/18 0943  12/28/18 0354 12/29/18 0439  WBC 12.2*  --  13.7* 8.3  NEUTROABS 10.5*  --  11.8*  --   HGB 15.6*   < > 13.2 12.2  HCT 45.2   < > 38.8 37.2  MCV 85.0  --  89.2 92.3  PLT 211  --  203 140*   < > = values in this interval not displayed.    Basic Metabolic Panel:  Recent Labs  Lab 12/28/18 0354 12/29/18 0439  NA 137 139  K 3.7 3.3*  CL 103 107  CO2 18* 21*  GLUCOSE 169* 90  BUN 21 26*  CREATININE 1.40* 1.32*  CALCIUM 7.9* 7.9*  MG 1.7 2.2   Lipid Panel:     Component Value Date/Time   CHOL 186 12/28/2018 0354   TRIG 120 12/28/2018 0354   HDL 44 12/28/2018 0354   CHOLHDL 4.2 12/28/2018 0354   VLDL 24 12/28/2018 0354   LDLCALC 118 (H) 12/28/2018 0354   HgbA1c:  Lab Results  Component Value Date   HGBA1C 6.1 (H) 12/28/2018   Urine Drug Screen:     Component Value Date/Time   LABOPIA NONE DETECTED 12/27/2018 1023   COCAINSCRNUR NONE DETECTED 12/27/2018 1023   LABBENZ NONE DETECTED 12/27/2018 1023   AMPHETMU NONE DETECTED 12/27/2018 1023   THCU NONE DETECTED 12/27/2018 1023   LABBARB NONE DETECTED 12/27/2018 1023    Alcohol Level     Component Value Date/Time   ETH <10 12/27/2018 0943    IMAGING  MRI/MRA pending  Cerebral Angio 12/27/2018 1116 RT common carotid arteriogram followed by partial revacularization of occluded RT MCA dominant inf division with x 2  passes with solitaire 17mm x 40 mm x retiever device achieving a TICI 2b revascularization  CT head 12/27/2018 1116 1. Abnormal hypodensity within the right insula consistent with acute infarction change. ASPECTS 9. 2. Generalized parenchymal atrophy with chronic small vessel ischemic disease. Chronic infarcts within the bilateral cerebellum. CTA neck: Common carotid, internal carotid and vertebral arteries patent within the neck bilaterally without significant stenosis.   Ct Angio Head W Or Wo Contrast 12/27/2018 1116 1. 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 Some reconstitution of flow is seen more distally within this vessel. 2. Additional intracranial atherosclerotic stenoses, most notably as follows. Mild/moderate focal stenosis within the intracranial left vertebral artery. Moderate focal stenosis within the P2 right posterior cerebral artery. 3. 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   Ct Angio Neck W Or Wo Contrast 12/27/2018 1116 Common carotid, internal carotid and vertebral arteries patent within the neck bilaterally without significant stenosis.  Ct Cerebral Perfusion W Contrast 12/27/2018 1116 No core infarct is identified by the perfusion software. However, definite acute infarction changes are  demonstrated within the right insula on concurrent noncontrast head CT. ASPECTS 9. 60 mL region of hypoperfused parenchyma detected predominantly within the right MCA vascular territory. Reported mismatch 60 mL.   Ct Head Wo Contrast 12/27/2018 1000 Atrophy with periventricular small vessel disease. Prior small infarct in the superior right cerebellum. No acute infarct evident. No mass or hemorrhage. There are foci of arterial vascular calcification. There is mucosal thickening in several ethmoid air cells.   Dg Chest 1 View 12/27/2018 1130 No active disease.   Portable Chest X-ray 12/27/2018 1718 Endotracheal tube  well positioned 3 cm above the carina. Cardiomegaly. Previous CABG. No active process evident otherwise.   Dg Hip Unilat W Or Wo Pelvis 2-3 Views Left 12/27/2018 1130 Negative.    PHYSICAL EXAM  Temp:  [97.3 F (36.3 C)-99.7 F (37.6 C)] 97.3 F (36.3 C) (11/18 0800) Pulse Rate:  [42-112] 69 (11/18 0808) Resp:  [11-24] 12 (11/18 0808) BP: (101-152)/(42-97) 143/57 (11/18 0808) SpO2:  [97 %-100 %] 100 % (11/18 0808)  General - Well nourished, well developed, not in acute distress  Ophthalmologic - fundi not visualized due to noncooperation.  Cardiovascular - regular reate and rhythm, not in afib   Neuro - awake alert, orientated to self, age and place but not to situation, able to repeat simple sentences, but mild to moderate dysarthria, no aphasia, naming 2/2, following most simple commands. Hard of hearing. Bilateral horizontal movement, blinking to visual threat bilaterally but not consistent from time to time. Facial symmetrical. RUE 5/5 and purposeful movement, strong, LUE 3-/5 barely against gravity but with drift. LLE 3+/5 and RLE 4/5. Sensation light touch symmetrical subjectively. Coordination not cooperative and gait not tested.    ASSESSMENT/PLAN Alexis Haas is a 85 y.o. female with history of HTN, HLD CAD found down by son presenting with L sided weakness, neglect and anosognosia. To IR for R M2 occlusion w/ TICI2b revascularization.  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 Old superior R cerebellar infarct.  CT head hypodensity R insula. Small vessel disease. Atrophy. Old bilateral cerebellar infarcts. ASPECTS 9    CTA head proximal R M2 occlusion. Additional intracranial atherosclerosis (mild/mod L VA, moderate R P2). 59mm paraclinoid L ICA aneurysm  CTA neck Unremarkable   CT perfusion no core infarct. R insular penumbra w/ 60 mL mismatch  Cerebral  angio showed right M2 occlusion s/p TICI2b revascularization  MRI right large MCA infarct with punctate left ACA infarct  MRA right MCA patent now   2D Echo EF 60-65%   LDL 118  HgbA1c 6.1  lovenox for VTE prophylaxis  aspirin 81 mg daily prior to admission, now on aspirin 325 daily. Will consider anticoagulation 5 days post stroke given the size of infarct   Therapy recommendations:  pending   Disposition:  pending   Acute Respiratory Failure  Intubated for IR  Left intubated post IR as COVID pending   Extubated 11/17  CCM on board  Tolerating well  Atrial Fibrillation w/ ectopy, new onset  Home anticoagulation:  none   CHA2DS2-VASc Score = 7, ?2 oral anticoagulation recommended  Age in Years:  ?35   +2    Sex:  Female   Female   +1    Hypertension History:  yes   +1     Diabetes Mellitus:  0  Congestive Heart Failure History:  0  Vascular Disease History:  yes   +1     Stroke/TIA/Thromboembolism History:  yes   +2 . Consider AC 5 days post stroke given the size and continue at discharge   Hypotension Hx Hypertension  Home meds:  Metoprolol 25, triamterene-HCTZ 37.5-25  Stable BP, improved off sedation  Initially on cleviprex, now off BP goal < 180 . Long-term BP goal normotensive  Hyperlipidemia  Home meds:  No statin  LDL 118, goal < 70  On lipitor 20 given advanced age  Continue statin at discharge  Dysphagia . Secondary to stroke . On dys 1 and thin liquid . Speech on board . On IVF @ 3 . Encourage po intake   UTI with leukocystosis  Leukocytosis. WBC 12.2->13.7->8.3   UA w/ trace leukocytoes, few bacteria, WBC 21-50.   UCx E Coli   Rocephin D# 3/5.  Other Stroke Risk Factors  Advanced age  Coronary artery disease s/p CABG  Other Active Problems  Adenocarcinoma of the hepatic flexure  Hypokalemia K 3.3 - supplement  GERD on PPI  AKI - Cre 1.25->1.4 ->1.32 - Gentle hydration on IVF @50 , encourage po  intake  Hospital day # 2  This patient is critically ill due to right MCA infarct, s/p thrombectomy, new onset AF, dysaphagia and at significant risk of neurological worsening, death form recurrent stroke, hemorrhagic conversion, heart failure, aspiration pneumonia. This patient's care requires constant monitoring of vital signs, hemodynamics, respiratory and cardiac monitoring, review of multiple databases, neurological assessment, discussion with family, other specialists and medical decision making of high complexity. I spent 35 minutes of neurocritical care time in the care of this patient.   Alexis Hawking, MD PhD Stroke Neurology 12/29/2018 9:39 AM  I had long discussion with daughter over the phone, updated pt current condition, treatment plan and potential prognosis, and answered all the questions. She expressed understanding and appreciation.   Alexis Hawking, MD PhD Stroke Neurology 12/29/2018 4:38 PM    To contact Stroke Continuity provider, please refer to [email or web address] After hours, contact General Neurology

## 2018-12-29 NOTE — Progress Notes (Signed)
Referring Physician(s): Dr. Cheral Marker  Supervising Physician: Luanne Bras  Patient Status:  [facility] - In-pt  Chief Complaint: Follow Code Stroke 12/27/18 s/p partial revascularization of occluded right MCA with Dr. Estanislado Pandy.  Subjective:  Sitting up in bed, states just finished eating breakfast. Denies any complaints currently.   Allergies: Amoxicillin  Medications: Prior to Admission medications   Medication Sig Start Date End Date Taking? Authorizing Provider  acetaminophen (TYLENOL) 500 MG tablet Take 500 mg by mouth daily.   Yes [provider]  aspirin EC 81 MG tablet Take 81 mg by mouth every 4 (four) hours as needed.    Yes [provider]  ferrous sulfate 325 (65 FE) MG tablet Take 325 mg by mouth daily with breakfast.   Yes [provider]  metoprolol tartrate (LOPRESSOR) 25 MG tablet Take 25 mg by mouth daily.    Yes [provider]  naproxen sodium (ANAPROX) 220 MG tablet Take 220 mg by mouth daily as needed (for pain).    Yes [provider]  omeprazole (PRILOSEC) 20 MG capsule Take 20 mg by mouth at bedtime.    Yes [provider]  triamterene-hydrochlorothiazide (MAXZIDE-25) 37.5-25 MG tablet Take 1 tablet by mouth daily.   Yes [provider]     Vital Signs: BP (!) 143/57    Pulse 69    Temp (!) 97.3 F (36.3 C) (Axillary)    Resp 12    SpO2 100%   Physical Exam Vitals signs and nursing note reviewed.  Cardiovascular:     Rate and Rhythm: Normal rate and regular rhythm.     Pulses: Normal pulses.     Comments: Palpable distal pulses bilaterally Pulmonary:     Effort: Pulmonary effort is normal.     Breath sounds: Normal breath sounds.  Skin:    General: Skin is warm and dry.     Comments: (+) right CFA puncture site soft without ecchymosis or hematoma, no active bleeding or drainage. Denies pain on palpation. Clean, dry, dressed appropriately.   Neurological:     Mental Status: She is  alert.     Comments: Oriented to self and hospital. Unable to state day of week. Falls asleep quickly during exam but awakens easily to verbal cues.      Imaging: Ct Angio Head W Or Wo Contrast  Result Date: 12/27/2018 CLINICAL DATA:  Stroke, follow-up. Slurred speech, left-sided weakness. EXAM: CT ANGIOGRAPHY HEAD AND NECK CT PERFUSION BRAIN TECHNIQUE: Multidetector CT imaging of the head and neck was performed using the standard protocol during bolus administration of intravenous contrast. Multiplanar CT image reconstructions and MIPs were obtained to evaluate the vascular anatomy. Carotid stenosis measurements (when applicable) are obtained utilizing NASCET criteria, using the distal internal carotid diameter as the denominator. Multiphase CT imaging of the brain was performed following IV bolus contrast injection. Subsequent parametric perfusion maps were calculated using RAPID software. CONTRAST:  128mL OMNIPAQUE IOHEXOL 350 MG/ML SOLN COMPARISON:  None. Noncontrast head CT performed earlier the same day 12/27/2018 head CT 03/12/2006 FINDINGS: CT HEAD FINDINGS Brain: No evidence of acute intracranial hemorrhage. There is abnormal hypodensity within the right insula consistent with acute infarction change (series 12, image 15). No evidence of intracranial mass. No midline shift or extra-axial fluid collection. Ill-defined hypoattenuation within the cerebral white matter consistent with chronic small vessel ischemic disease. Chronic infarcts within the bilateral cerebellum. Mild generalized parenchymal atrophy. Vascular: Reported separately Skull: Normal. Negative for fracture or focal lesion. Sinuses/Orbits: Visualized  orbits demonstrate no acute abnormality. No significant paranasal sinus disease or mastoid effusion ASPECTS ([place] Stroke Program Early CT Score) - Ganglionic level infarction (caudate, lentiform nuclei, internal capsule, insula, M1-M3 cortex): 6 - Supraganglionic infarction (M4-M6  cortex): 3 Total score (0-10 with 10 being normal): 9 Review of the MIP images confirms the above findings CTA NECK FINDINGS Aortic arch: Standard branching. Scattered soft and calcified plaque within the visualized aortic arch and proximal major branch vessels of the neck. Right carotid system: CCA and ICA widely patent within the neck without measurable stenosis. Left carotid system: CCA and ICA widely patent within the neck without measurable stenosis. Vertebral arteries: Cervical vertebral arteries patent bilaterally without significant stenosis (50% or greater). Skeleton: No acute bony abnormality. Cervical spondylosis with multilevel posterior disc osteophytes, uncovertebral and facet hypertrophy. Severe C5-C6 intervertebral disc height loss with possible bony ankylosis. Other neck: No soft tissue neck mass or pathologically enlarged cervical chain lymph nodes. Thyroid negative. Upper chest: No consolidation within the imaged lung apices. Review of the MIP images confirms the above findings CTA HEAD FINDINGS Anterior circulation: The intracranial internal carotid arteries are patent bilaterally. Calcified plaque within the cavernous/paraclinoid segments with regions of mild stenosis. 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 The M1 right middle cerebral artery is patent without significant stenosis. There is segmental occlusion of a proximal M2 middle cerebral artery branch (series 6, images 212-214). There is some reconstitution of flow more distally within this vessel. The bilateral anterior cerebral arteries and left middle cerebral artery are patent without significant proximal stenosis. Posterior circulation: The intracranial vertebral arteries are patent. Calcified plaque within the intracranial left vertebral artery with focal mild/moderate stenosis. The basilar artery is patent without significant stenosis. The bilateral  posterior cerebral arteries are patent. Moderate focal stenosis within the P2 right posterior cerebral artery (series 9, image 42). Posterior communicating arteries are present bilaterally. Venous sinuses: Within limitations of contrast timing, no convincing thrombus. Review of the MIP images confirms the above findings CT Brain Perfusion Findings: ASPECTS: 9 CBF (<30%) Volume: 0 mL identified Perfusion (Tmax>6.0s) volume: 60 mL (predominantly within the right MCA vascular territory). Mismatch Volume: 60 mL Infarction Location:Right MCA vascular territory. These results were called by telephone at the time of interpretation on 12/27/2018 at 11:49 am to provider Dr. Joni Fears, who verbally acknowledged these results. IMPRESSION: CT head: 1. Abnormal hypodensity within the right insula consistent with acute infarction change. ASPECTS 9. 2. Generalized parenchymal atrophy with chronic small vessel ischemic disease. Chronic infarcts within the bilateral cerebellum. CTA neck: Common carotid, internal carotid and vertebral arteries patent within the neck bilaterally without significant stenosis. CTA head: 1. 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 Some reconstitution of flow is seen more distally within this vessel. 2. Additional intracranial atherosclerotic stenoses, most notably as follows. Mild/moderate focal stenosis within the intracranial left vertebral artery. Moderate focal stenosis within the P2 right posterior cerebral artery. 3. 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 CT perfusion head: No core infarct is identified by the perfusion software. However, definite acute infarction changes are demonstrated within the right insula on concurrent noncontrast head CT. ASPECTS 9. 60 mL region of hypoperfused parenchyma detected predominantly within the right MCA vascular territory. Reported mismatch 60 mL. Electronically Signed   By: Kellie Simmering DO   On:  12/27/2018 11:58   Dg Chest 1 View  Result Date: 12/27/2018 CLINICAL  DATA:  Weakness after fall. EXAM: CHEST  1 VIEW COMPARISON:  None. FINDINGS: Mild cardiomegaly. Status post coronary bypass graft. No pneumothorax or pleural effusion is noted. Both lungs are clear. The visualized skeletal structures are unremarkable. IMPRESSION: No active disease. Electronically Signed   By: Marijo Conception M.D.   On: 12/27/2018 12:05   Ct Head Wo Contrast  Result Date: 12/27/2018 CLINICAL DATA:  Left-sided weakness. Slurred speech. Questionable unwitnessed fall EXAM: CT HEAD WITHOUT CONTRAST TECHNIQUE: Contiguous axial images were obtained from the base of the skull through the vertex without intravenous contrast. COMPARISON:  March 12, 2006 FINDINGS: Brain: There is mild diffuse atrophy. There is no intracranial mass, hemorrhage, extra-axial fluid collection, or midline shift. There is evidence of a prior focal infarct in the superior right cerebellum compared to the prior study. There is patchy small vessel disease in the centra semiovale bilaterally. No acute appearing infarct is demonstrable on this study. Vascular: No hyperdense vessel. There are foci of calcification in the distal left vertebral artery and in the carotid siphon regions bilaterally. Skull: The bony calvarium appears intact. Sinuses/Orbits: There is mucosal thickening in several ethmoid air cells. Other visualized paranasal sinuses are clear. Orbits appear symmetric bilaterally. Other: Visualized mastoid air cells are clear. IMPRESSION: Atrophy with periventricular small vessel disease. Prior small infarct in the superior right cerebellum. No acute infarct evident. No mass or hemorrhage. There are foci of arterial vascular calcification. There is mucosal thickening in several ethmoid air cells. Electronically Signed   By: Lowella Grip III M.D.   On: 12/27/2018 10:06   Ct Angio Neck W Or Wo Contrast  Result Date: 12/27/2018 CLINICAL DATA:   Stroke, follow-up. Slurred speech, left-sided weakness. EXAM: CT ANGIOGRAPHY HEAD AND NECK CT PERFUSION BRAIN TECHNIQUE: Multidetector CT imaging of the head and neck was performed using the standard protocol during bolus administration of intravenous contrast. Multiplanar CT image reconstructions and MIPs were obtained to evaluate the vascular anatomy. Carotid stenosis measurements (when applicable) are obtained utilizing NASCET criteria, using the distal internal carotid diameter as the denominator. Multiphase CT imaging of the brain was performed following IV bolus contrast injection. Subsequent parametric perfusion maps were calculated using RAPID software. CONTRAST:  129mL OMNIPAQUE IOHEXOL 350 MG/ML SOLN COMPARISON:  None. Noncontrast head CT performed earlier the same day 12/27/2018 head CT 03/12/2006 FINDINGS: CT HEAD FINDINGS Brain: No evidence of acute intracranial hemorrhage. There is abnormal hypodensity within the right insula consistent with acute infarction change (series 12, image 15). No evidence of intracranial mass. No midline shift or extra-axial fluid collection. Ill-defined hypoattenuation within the cerebral white matter consistent with chronic small vessel ischemic disease. Chronic infarcts within the bilateral cerebellum. Mild generalized parenchymal atrophy. Vascular: Reported separately Skull: Normal. Negative for fracture or focal lesion. Sinuses/Orbits: Visualized orbits demonstrate no acute abnormality. No significant paranasal sinus disease or mastoid effusion ASPECTS ([place] Stroke Program Early CT Score) - Ganglionic level infarction (caudate, lentiform nuclei, internal capsule, insula, M1-M3 cortex): 6 - Supraganglionic infarction (M4-M6 cortex): 3 Total score (0-10 with 10 being normal): 9 Review of the MIP images confirms the above findings CTA NECK FINDINGS Aortic arch: Standard branching. Scattered soft and calcified plaque within the visualized aortic arch and proximal major  branch vessels of the neck. Right carotid system: CCA and ICA widely patent within the neck without measurable stenosis. Left carotid system: CCA and ICA widely patent within the neck without measurable stenosis. Vertebral arteries: Cervical vertebral arteries patent bilaterally without significant stenosis (50% or  greater). Skeleton: No acute bony abnormality. Cervical spondylosis with multilevel posterior disc osteophytes, uncovertebral and facet hypertrophy. Severe C5-C6 intervertebral disc height loss with possible bony ankylosis. Other neck: No soft tissue neck mass or pathologically enlarged cervical chain lymph nodes. Thyroid negative. Upper chest: No consolidation within the imaged lung apices. Review of the MIP images confirms the above findings CTA HEAD FINDINGS Anterior circulation: The intracranial internal carotid arteries are patent bilaterally. Calcified plaque within the cavernous/paraclinoid segments with regions of mild stenosis. 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 The M1 right middle cerebral artery is patent without significant stenosis. There is segmental occlusion of a proximal M2 middle cerebral artery branch (series 6, images 212-214). There is some reconstitution of flow more distally within this vessel. The bilateral anterior cerebral arteries and left middle cerebral artery are patent without significant proximal stenosis. Posterior circulation: The intracranial vertebral arteries are patent. Calcified plaque within the intracranial left vertebral artery with focal mild/moderate stenosis. The basilar artery is patent without significant stenosis. The bilateral posterior cerebral arteries are patent. Moderate focal stenosis within the P2 right posterior cerebral artery (series 9, image 42). Posterior communicating arteries are present bilaterally. Venous sinuses: Within limitations of contrast timing, no  convincing thrombus. Review of the MIP images confirms the above findings CT Brain Perfusion Findings: ASPECTS: 9 CBF (<30%) Volume: 0 mL identified Perfusion (Tmax>6.0s) volume: 60 mL (predominantly within the right MCA vascular territory). Mismatch Volume: 60 mL Infarction Location:Right MCA vascular territory. These results were called by telephone at the time of interpretation on 12/27/2018 at 11:49 am to provider Dr. Joni Fears, who verbally acknowledged these results. IMPRESSION: CT head: 1. Abnormal hypodensity within the right insula consistent with acute infarction change. ASPECTS 9. 2. Generalized parenchymal atrophy with chronic small vessel ischemic disease. Chronic infarcts within the bilateral cerebellum. CTA neck: Common carotid, internal carotid and vertebral arteries patent within the neck bilaterally without significant stenosis. CTA head: 1. 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 Some reconstitution of flow is seen more distally within this vessel. 2. Additional intracranial atherosclerotic stenoses, most notably as follows. Mild/moderate focal stenosis within the intracranial left vertebral artery. Moderate focal stenosis within the P2 right posterior cerebral artery. 3. 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 CT perfusion head: No core infarct is identified by the perfusion software. However, definite acute infarction changes are demonstrated within the right insula on concurrent noncontrast head CT. ASPECTS 9. 60 mL region of hypoperfused parenchyma detected predominantly within the right MCA vascular territory. Reported mismatch 60 mL. Electronically Signed   By: Kellie Simmering DO   On: 12/27/2018 11:58   Mr Jodene Nam Head Wo Contrast  Result Date: 12/28/2018 CLINICAL DATA:  85 year old female stroke presentation with proximal right MCA M2 occlusion treated with endovascular reperfusion. EXAM: MRA HEAD WITHOUT CONTRAST TECHNIQUE:  Angiographic images of the Circle of Willis were obtained using MRA technique without intravenous contrast. COMPARISON:  Brain MRI today. CTA head and neck and CT Perfusion yesterday. FINDINGS: Antegrade flow in the posterior circulation with no distal vertebral artery stenosis. Patent left PICA and bilateral AICA origins. Patent basilar artery without stenosis. Patent SCA and PCA origins. Both posterior communicating arteries are present. Left PCA branches are within normal limits. Stable moderate to severe right PCA P1 and P2 stenoses with preserved distal flow. Antegrade flow in both ICA siphons. Moderate to severe bilateral siphon irregularity. Mild to  moderate left anterior genu stenosis. Mild right supraclinoid ICA stenosis. Ophthalmic and posterior communicating artery origins are within normal limits. Patent carotid termini. Dominant right ACA A1 segment. Visible ACA branches are stable and within normal limits. Left MCA M1 segment, bifurcation, and left MCA branches are stable without stenosis. Right MCA M1 segment and bifurcation are patent without stenosis. The dominant posterior right M2 branch now is patent without stenosis. Multiple additional patent right M2 and M3 branches compared to the CTA yesterday. IMPRESSION: 1. Revascularization of the Right MCA branches since the CTA yesterday. No new or recurrent occlusion identified. 2. Extensive bilateral ICA siphon atherosclerosis with stable mild to moderate stenosis greater on the left. 3. 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 Electronically Signed   By: Genevie Ann M.D.   On: 12/28/2018 22:44   Mr Brain Wo Contrast  Result Date: 12/28/2018 CLINICAL DATA:  85 year old female stroke presentation with proximal right MCA M2 occlusion treated with endovascular reperfusion. EXAM: MRI HEAD WITHOUT CONTRAST TECHNIQUE: Multiplanar, multiecho pulse sequences of the brain and surrounding structures were obtained without intravenous contrast. COMPARISON:   CTA and CT Perfusion 12/27/2018. FINDINGS: Brain: Confluent restricted diffusion from the superior right temporal gyrus tracking posteriorly into the lateral right occipital lobe (series 5, image 80). Confluent cytotoxic edema in that area with mild petechial hemorrhage (series 14, image 29, Heidelberg Classification 1A). Additional restricted diffusion in the right insula and frontal operculum, occasionally scattered elsewhere in the right frontal lobe including the white matter and some of the right motor strip on series 5, image 98. There may also be a punctate area of cortical restricted diffusion in the medial left parietal lobe on series 5, image 89. The deep gray nuclei are spared. No intracranial mass effect or midline shift at this time. No ventriculomegaly. No definite chronic cerebral blood products. There are small chronic bilateral cerebellar infarcts. Patchy bilateral white matter T2 and FLAIR hyperintensity. Negative pituitary and cervicomedullary junction. Vascular: Major intracranial vascular flow voids are preserved. Skull and upper cervical spine: Negative for age visible cervical spine. Visualized bone marrow signal is within normal limits. Sinuses/Orbits: Negative, postoperative changes to the globes. Other: Trace mastoid fluid. Small benign midline nasopharynx retention cyst (series 10, image 5). IMPRESSION: 1. Confluent infarct tracking from the superior right temporal gyrus posteriorly through the lateral right occipital lobe. Additional infarct in the right insula, scattered in the right frontal lobe. 2. Cytotoxic edema and Heidelberg Classification 1A petechial blood. No intracranial mass effect. 3. Punctate diffusion abnormality also in the contralateral left parietal lobe. Chronic small cerebellar infarcts. Electronically Signed   By: Genevie Ann M.D.   On: 12/28/2018 22:24   Ct Cerebral Perfusion W Contrast  Result Date: 12/27/2018 CLINICAL DATA:  Stroke, follow-up. Slurred speech,  left-sided weakness. EXAM: CT ANGIOGRAPHY HEAD AND NECK CT PERFUSION BRAIN TECHNIQUE: Multidetector CT imaging of the head and neck was performed using the standard protocol during bolus administration of intravenous contrast. Multiplanar CT image reconstructions and MIPs were obtained to evaluate the vascular anatomy. Carotid stenosis measurements (when applicable) are obtained utilizing NASCET criteria, using the distal internal carotid diameter as the denominator. Multiphase CT imaging of the brain was performed following IV bolus contrast injection. Subsequent parametric perfusion maps were calculated using RAPID software. CONTRAST:  166mL OMNIPAQUE IOHEXOL 350 MG/ML SOLN COMPARISON:  None. Noncontrast head CT performed earlier the same day 12/27/2018 head CT 03/12/2006 FINDINGS: CT HEAD FINDINGS Brain: No evidence of acute intracranial hemorrhage. There is abnormal hypodensity within  the right insula consistent with acute infarction change (series 12, image 15). No evidence of intracranial mass. No midline shift or extra-axial fluid collection. Ill-defined hypoattenuation within the cerebral white matter consistent with chronic small vessel ischemic disease. Chronic infarcts within the bilateral cerebellum. Mild generalized parenchymal atrophy. Vascular: Reported separately Skull: Normal. Negative for fracture or focal lesion. Sinuses/Orbits: Visualized orbits demonstrate no acute abnormality. No significant paranasal sinus disease or mastoid effusion ASPECTS ([place] Stroke Program Early CT Score) - Ganglionic level infarction (caudate, lentiform nuclei, internal capsule, insula, M1-M3 cortex): 6 - Supraganglionic infarction (M4-M6 cortex): 3 Total score (0-10 with 10 being normal): 9 Review of the MIP images confirms the above findings CTA NECK FINDINGS Aortic arch: Standard branching. Scattered soft and calcified plaque within the visualized aortic arch and proximal major branch vessels of the neck. Right  carotid system: CCA and ICA widely patent within the neck without measurable stenosis. Left carotid system: CCA and ICA widely patent within the neck without measurable stenosis. Vertebral arteries: Cervical vertebral arteries patent bilaterally without significant stenosis (50% or greater). Skeleton: No acute bony abnormality. Cervical spondylosis with multilevel posterior disc osteophytes, uncovertebral and facet hypertrophy. Severe C5-C6 intervertebral disc height loss with possible bony ankylosis. Other neck: No soft tissue neck mass or pathologically enlarged cervical chain lymph nodes. Thyroid negative. Upper chest: No consolidation within the imaged lung apices. Review of the MIP images confirms the above findings CTA HEAD FINDINGS Anterior circulation: The intracranial internal carotid arteries are patent bilaterally. Calcified plaque within the cavernous/paraclinoid segments with regions of mild stenosis. 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 The M1 right middle cerebral artery is patent without significant stenosis. There is segmental occlusion of a proximal M2 middle cerebral artery branch (series 6, images 212-214). There is some reconstitution of flow more distally within this vessel. The bilateral anterior cerebral arteries and left middle cerebral artery are patent without significant proximal stenosis. Posterior circulation: The intracranial vertebral arteries are patent. Calcified plaque within the intracranial left vertebral artery with focal mild/moderate stenosis. The basilar artery is patent without significant stenosis. The bilateral posterior cerebral arteries are patent. Moderate focal stenosis within the P2 right posterior cerebral artery (series 9, image 42). Posterior communicating arteries are present bilaterally. Venous sinuses: Within limitations of contrast timing, no convincing thrombus. Review of the MIP  images confirms the above findings CT Brain Perfusion Findings: ASPECTS: 9 CBF (<30%) Volume: 0 mL identified Perfusion (Tmax>6.0s) volume: 60 mL (predominantly within the right MCA vascular territory). Mismatch Volume: 60 mL Infarction Location:Right MCA vascular territory. These results were called by telephone at the time of interpretation on 12/27/2018 at 11:49 am to provider Dr. Joni Fears, who verbally acknowledged these results. IMPRESSION: CT head: 1. Abnormal hypodensity within the right insula consistent with acute infarction change. ASPECTS 9. 2. Generalized parenchymal atrophy with chronic small vessel ischemic disease. Chronic infarcts within the bilateral cerebellum. CTA neck: Common carotid, internal carotid and vertebral arteries patent within the neck bilaterally without significant stenosis. CTA head: 1. 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 Some reconstitution of flow is seen more distally within this vessel. 2. Additional intracranial atherosclerotic stenoses, most notably as follows. Mild/moderate focal stenosis within the intracranial left vertebral artery. Moderate focal stenosis within the P2 right posterior cerebral artery. 3. 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 CT perfusion head: No core infarct is identified by the perfusion software. However,  definite acute infarction changes are demonstrated within the right insula on concurrent noncontrast head CT. ASPECTS 9. 60 mL region of hypoperfused parenchyma detected predominantly within the right MCA vascular territory. Reported mismatch 60 mL. Electronically Signed   By: Kellie Simmering DO   On: 12/27/2018 11:58   Dg Chest Port 1 View  Result Date: 12/29/2018 CLINICAL DATA:  85 year old female with respiratory failure. EXAM: PORTABLE CHEST 1 VIEW COMPARISON:  Chest radiograph dated 12/27/2018. FINDINGS: Interval removal of the endotracheal tube. Left lung base atelectasis.  Overall no interval change in the appearance of the lungs since the prior radiograph. No pneumothorax. Stable cardiac silhouette. Atherosclerotic calcification of the aorta. Median sternotomy wires and CABG vascular clips. No acute osseous pathology. IMPRESSION: Interval extubation. No interval change in the appearance of the lungs since the prior radiograph. Electronically Signed   By: Anner Crete M.D.   On: 12/29/2018 08:44   Portable Chest X-ray  Result Date: 12/27/2018 CLINICAL DATA:  Endotracheal placement. EXAM: PORTABLE CHEST 1 VIEW COMPARISON:  Earlier same day FINDINGS: Endotracheal tube tip is 3 cm above the carina. Previous median sternotomy and CABG. Mild chronic cardiomegaly. The lungs are clear. The vascularity is normal. Chronic degenerative changes of the shoulders. IMPRESSION: Endotracheal tube well positioned 3 cm above the carina. Cardiomegaly. Previous CABG. No active process evident otherwise. Electronically Signed   By: Nelson Chimes M.D.   On: 12/27/2018 17:35   Dg Hip Unilat W Or Wo Pelvis 2-3 Views Left  Result Date: 12/27/2018 CLINICAL DATA:  Left hip pain after unwitnessed fall. EXAM: DG HIP (WITH OR WITHOUT PELVIS) 2-3V LEFT COMPARISON:  None. FINDINGS: There is no evidence of hip fracture or dislocation. There is no evidence of arthropathy or other focal bone abnormality. IMPRESSION: Negative. Electronically Signed   By: Marijo Conception M.D.   On: 12/27/2018 12:04    Labs:  CBC: Recent Labs    08/18/18 1341 12/27/18 0943 12/27/18 1811 12/28/18 0354 12/29/18 0439  WBC 4.9 12.2*  --  13.7* 8.3  HGB 13.5 15.6* 12.2 13.2 12.2  HCT 40.7 45.2 36.0 38.8 37.2  PLT 217 211  --  203 140*    COAGS: Recent Labs    12/27/18 0943  INR 0.9  APTT 26    BMP: Recent Labs    08/18/18 1341 12/27/18 0943 12/27/18 1811 12/28/18 0354 12/29/18 0439  NA 135 136 136 137 139  K 4.3 3.8 3.2* 3.7 3.3*  CL 102 99  --  103 107  CO2 23 22  --  18* 21*  GLUCOSE 108*  140*  --  169* 90  BUN 28* 31*  --  21 26*  CALCIUM 8.7* 9.5  --  7.9* 7.9*  CREATININE 1.43* 1.25*  --  1.40* 1.32*  GFRNONAA 30* 36*  --  31* 33*  GFRAA 35* 41*  --  36* 39*    LIVER FUNCTION TESTS: Recent Labs    02/17/18 1303 08/18/18 1341 12/27/18 0943  BILITOT 1.1 0.4 1.0  AST 27 23 33  ALT 21 20 17   ALKPHOS 86 53 55  PROT 7.1 7.2 7.6  ALBUMIN 4.2 4.0 4.4    Assessment and Plan:  85 y/o F who presented as a Code Stroke to [facility] ED on 11/16 s/p partial revascularization of occluded right MCA with Dr. Estanislado Pandy. Patient denies complaints today, able to speak in short sentences, eating breakfast after passing speech evaluation. Right CFA puncture site unremarkable.  Continue ASA 325 mg QD.  Further plans per neurology - appreciate and agree with care plan.   Please call IR with any questions or concerns.   Electronically Signed: Joaquim Nam, PA-C 12/29/2018, 10:17 AM   I spent a total of 15 Minutes at the the patient's bedside AND on the patient's hospital floor or unit, greater than 50% of which was counseling/coordinating care for code stroke follow up.

## 2018-12-29 NOTE — Progress Notes (Signed)
STROKE TEAM PROGRESS NOTE   INTERVAL HISTORY Daughter at bedside. Pt sleepy but easily arousable. She still has more left UE weakness than LE weakness, still has slurry speech. PT/OT recommend CIR. Will start anticoagulation tomorrow evening.      Vitals:   12/29/18 2325 12/30/18 0308 12/30/18 0855 12/30/18 1129  BP: (!) 148/79 (!) 159/74 (!) 165/75 (!) 142/65  Pulse: (!) 109 (!) 109 (!) 102 97  Resp: 20 18 19 18   Temp: 98 F (36.7 C) (!) 97.5 F (36.4 C) 99.8 F (37.7 C) 98.7 F (37.1 C)  TempSrc: Oral Oral Oral Oral  SpO2: 99% 93% 97% 97%    CBC:  Recent Labs  Lab 12/27/18 0943  12/28/18 0354 12/29/18 0439 12/30/18 0355  WBC 12.2*  --  13.7* 8.3 8.3  NEUTROABS 10.5*  --  11.8*  --   --   HGB 15.6*   < > 13.2 12.2 10.9*  HCT 45.2   < > 38.8 37.2 32.1*  MCV 85.0  --  89.2 92.3 88.9  PLT 211  --  203 140* 149*   < > = values in this interval not displayed.    Basic Metabolic Panel:  Recent Labs  Lab 12/28/18 0354 12/29/18 0439 12/30/18 0355  NA 137 139 138  K 3.7 3.3* 3.9  CL 103 107 110  CO2 18* 21* 18*  GLUCOSE 169* 90 125*  BUN 21 26* 28*  CREATININE 1.40* 1.32* 1.29*  CALCIUM 7.9* 7.9* 8.1*  MG 1.7 2.2  --    Lipid Panel:     Component Value Date/Time   CHOL 186 12/28/2018 0354   TRIG 120 12/28/2018 0354   HDL 44 12/28/2018 0354   CHOLHDL 4.2 12/28/2018 0354   VLDL 24 12/28/2018 0354   LDLCALC 118 (H) 12/28/2018 0354   HgbA1c:  Lab Results  Component Value Date   HGBA1C 6.1 (H) 12/28/2018   Urine Drug Screen:     Component Value Date/Time   LABOPIA NONE DETECTED 12/27/2018 1023   COCAINSCRNUR NONE DETECTED 12/27/2018 1023   LABBENZ NONE DETECTED 12/27/2018 1023   AMPHETMU NONE DETECTED 12/27/2018 1023   THCU NONE DETECTED 12/27/2018 1023   LABBARB NONE DETECTED 12/27/2018 1023    Alcohol Level     Component Value Date/Time   ETH <10 12/27/2018 0943    IMAGING  Mr Jodene Nam Head Wo Contrast 12/28/2018 1. Revascularization of the Right  MCA branches since the CTA yesterday. No new or recurrent occlusion identified. 2. Extensive bilateral ICA siphon atherosclerosis with stable mild to moderate stenosis greater on the left. 3. 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   Mr Brain Wo Contrast 12/28/2018 1. Confluent infarct tracking from the superior right temporal gyrus posteriorly through the lateral right occipital lobe. Additional infarct in the right insula, scattered in the right frontal lobe. 2. Cytotoxic edema and Heidelberg Classification 1A petechial blood. No intracranial mass effect. 3. Punctate diffusion abnormality also in the contralateral left parietal lobe. Chronic small cerebellar infarcts.   Cerebral Angio 12/27/2018 1116 RT common carotid arteriogram followed by partial revacularization of occluded RT MCA dominant inf division with x 2 passes with solitaire 65mm x 40 mm x retiever device achieving a TICI 2b revascularization  CT head 12/27/2018 1116 1. Abnormal hypodensity within the right insula consistent with acute infarction change. ASPECTS 9. 2. Generalized parenchymal atrophy with chronic small vessel ischemic disease. Chronic infarcts within the bilateral cerebellum. CTA neck: Common carotid, internal carotid and vertebral arteries patent  within the neck bilaterally without significant stenosis.   Ct Angio Head W Or Wo Contrast 12/27/2018 1116 1. 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 Some reconstitution of flow is seen more distally within this vessel. 2. Additional intracranial atherosclerotic stenoses, most notably as follows. Mild/moderate focal stenosis within the intracranial left vertebral artery. Moderate focal stenosis within the P2 right posterior cerebral artery. 3. 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   Ct Angio Neck W Or Wo Contrast 12/27/2018 1116 Common carotid, internal carotid and vertebral arteries patent within the  neck bilaterally without significant stenosis.  Ct Cerebral Perfusion W Contrast 12/27/2018 1116 No core infarct is identified by the perfusion software. However, definite acute infarction changes are demonstrated within the right insula on concurrent noncontrast head CT. ASPECTS 9. 60 mL region of hypoperfused parenchyma detected predominantly within the right MCA vascular territory. Reported mismatch 60 mL.   Ct Head Wo Contrast 12/27/2018 1000 Atrophy with periventricular small vessel disease. Prior small infarct in the superior right cerebellum. No acute infarct evident. No mass or hemorrhage. There are foci of arterial vascular calcification. There is mucosal thickening in several ethmoid air cells.   Dg Chest 1 View 12/29/2018 Interval extubation. No interval change in the appearance of the lungs since the prior radiograph.   12/27/2018 1130 No active disease.   Portable Chest X-ray 12/27/2018 1718 Endotracheal tube well positioned 3 cm above the carina. Cardiomegaly. Previous CABG. No active process evident otherwise.   Dg Hip Unilat W Or Wo Pelvis 2-3 Views Left 12/27/2018 1130 Negative.    PHYSICAL EXAM   Temp:  [97.5 F (36.4 C)-100.6 F (38.1 C)] 98.7 F (37.1 C) (11/19 1129) Pulse Rate:  [97-119] 97 (11/19 1129) Resp:  [18-21] 18 (11/19 1129) BP: (93-165)/(65-92) 142/65 (11/19 1129) SpO2:  [93 %-99 %] 97 % (11/19 1129)  General - Well nourished, well developed, sleepy but arousable easily  Ophthalmologic - fundi not visualized due to noncooperation.  Cardiovascular - regular reate and rhythm, not in afib   Neuro - awake alert, orientated to self, age and place but not to situation, able to repeat simple sentences, but moderate dysarthria, no aphasia but paucity of speech, naming 2/2, following most simple commands. Hard of hearing. Bilateral horizontal movement, blinking to visual threat bilaterally. Facial symmetrical. RUE 5/5 and purposeful movement, LUE  3-/5 barely against gravity. LLE 3+/5 and RLE 4/5. Sensation light touch symmetrical subjectively. Coordination not cooperative and gait not tested.    ASSESSMENT/PLAN Alexis Haas is a 85 y.o. female with history of HTN, HLD CAD found down by son presenting with L sided weakness, neglect and anosognosia. To IR for R M2 occlusion w/ TICI2b revascularization.  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 Old superior R cerebellar infarct.  CT head hypodensity R insula. Small vessel disease. Atrophy. Old bilateral cerebellar infarcts. ASPECTS 9    CTA head proximal R M2 occlusion. Additional intracranial atherosclerosis (mild/mod L VA, moderate R P2). 16mm paraclinoid L ICA aneurysm  CTA neck Unremarkable   CT perfusion no core infarct. R insular penumbra w/ 60 mL mismatch  Cerebral angio showed right M2 occlusion s/p TICI2b revascularization  MRI  right large MCA infarct with punctate left ACA infarct  MRA  Right MCA patent now   2D Echo EF 60-65%. No source  of embolus   LDL 118  HgbA1c 6.1  Lovenox 30 mg sq daily for VTE prophylaxis  aspirin 81 mg daily prior to admission, now on aspirin 325 mg daily. Will consider anticoagulation tomorrow evening   Therapy recommendations:  CIR.   Disposition:  pending - rehab admission coordinator pursing insurance approval for admission next week  Acute Respiratory Failure  Intubated for IR  Left intubated post IR as COVID pending   Covid resulted negative  Extubated 11/17  CCM signed off  Atrial Fibrillation w/ ectopy, new onset  Home anticoagulation:  none   CHA2DS2-VASc Score = 7, ?2 oral anticoagulation recommended  Age in Years:  ?59   +2    Sex:  Female   Female   +1    Hypertension History:  yes   +1     Diabetes Mellitus:  0  Congestive Heart Failure History:  0  Vascular Disease History:  yes   +1      Stroke/TIA/Thromboembolism History:  yes   +2  consider DOAC tomorrow evening   Hypotension Hx Hypertension  Home meds:  Metoprolol 25, triamterene-HCTZ 37.5-25  Stable BP  Treated with cleviprex, now off  Resume metoprolol 25mg  bid BP goal < 180 . Long-term BP goal normotensive  Hyperlipidemia  Home meds:  No statin  LDL 118, goal < 70  Now on lipitor 20 given advanced age  Continue statin at discharge  Dysphagia . Secondary to stroke . On D2 thin liquids . Speech on board . On IVF @ 30  Encourage po intake   UTI with leukocystosis  Leukocytosis. WBC 12.2->13.7->8.3->8.3   UA w/ trace leukocytoes, few bacteria, WBC 21-50.   UCx E Coli  Treated with Rocephin and 1 dose fosofomycin  Other Stroke Risk Factors  Advanced age  Coronary artery disease s/p CABG  Other Active Problems  Adenocarcinoma of the hepatic flexure  Hypokalemia K 3.3 - supplemented - 3.9   GERD on PPI  AKI. Cre 1.25->1.4->1.32->1.29. continue gentle hydration on IVF @30 , encourage [facility] day # 3  Alexis Hawking, MD PhD Stroke Neurology 12/30/2018 1:17 PM   To contact Stroke Continuity provider, please refer to [email or web address] After hours, contact General Neurology

## 2018-12-29 NOTE — Evaluation (Signed)
Physical Therapy Evaluation Patient Details Name: Alexis Haas MRN: [id number] DOB: 04-14-1917 Today's Date: 12/29/2018   History of Present Illness  Pt is a 76 yoF originally presenting to [facility] with left sided weakness and AMS found to have acute right M2 occlusion. LSW 11/15 around 1800.  Transferred to [facility] for endovascular repair with partial revascularization of R MCA. PMHx: CAD s/p CABG 2005, HTN, HLD, anemia, CKD, arthritis.  Clinical Impression  Pt admitted with/for R CVA with revascularization.  Pt needing maximal assist of 1 or 2 persons for mobility at time time.  She was unable to fully stand or take steps today..  Pt currently limited functionally due to the problems listed. ( See problems list.)   Pt will benefit from PT to maximize function and safety in order to get ready for next venue listed below.     Follow Up Recommendations CIR;Supervision/Assistance - 24 hour    Equipment Recommendations  Other (comment)(TBA next venue)    Recommendations for Other Services Rehab consult     Precautions / Restrictions Precautions Precautions: Fall Restrictions Weight Bearing Restrictions: No      Mobility  Bed Mobility Overal bed mobility: Needs Assistance Bed Mobility: Rolling;Supine to Sit Rolling: Max assist;+2 for physical assistance;+2 for safety/equipment Sidelying to sit: Max assist;+2 for physical assistance;+2 for safety/equipment Supine to sit: Max assist;+2 for physical assistance;+2 for safety/equipment     General bed mobility comments: Pt assisting with rolling and moving BLEs over to edge, but maxA +2 required for trunk elevation, moving BLEs, and scooting bottom to EOB.  Transfers Overall transfer level: Needs assistance Equipment used: Rolling walker (2 wheeled);None(sit to stand x3 times with RW and none for transfer) Transfers: Sit to/from [employer] Sit to Stand: Max assist;Total assist;+2 physical assistance;+2  safety/equipment;From elevated surface   Squat pivot transfers: Total assist     General transfer comment: Pt requiring maxA +2 for sit to stand - unable to stand- knees flexed unable to come all the way to standing..  face to face transfer to chair with significant assist  Ambulation/Gait             General Gait Details: not able today  Stairs            Wheelchair Mobility    Modified Rankin (Stroke Patients Only)       Balance Overall balance assessment: Needs assistance Sitting-balance support: Bilateral upper extremity supported Sitting balance-Leahy Scale: Poor Sitting balance - Comments: Pt with posterior lean (has hernia per DIL, but pt requiring intermittent assist for upright sitting.   Standing balance support: Bilateral upper extremity supported Standing balance-Leahy Scale: Poor Standing balance comment: unable to fully stand with knees flexed and LUE unable to grasp RW for stability.                             Pertinent Vitals/Pain Pain Assessment: No/denies pain Faces Pain Scale: No hurt    Home Living Family/patient expects to be discharged to:: Private residence Living Arrangements: Alone Available Help at Discharge: Family;Available PRN/intermittently Type of Home: House Home Access: Ramped entrance Entrance Stairs-Rails: Can reach both   Home Layout: One level Home Equipment: Walker - 2 wheels;Toilet riser;Cane - single point Additional Comments: SPC for mobility; Son daily check-in; Daughter groccery shops 1x weekly; no one spends the night    Prior Function Level of Independence: Independent         Comments: Pt reports  independence with ADL (sponge bath)- SOn daily check-in; Daughter groccery shops 1x weekly; no one spends the night      Hand Dominance   Dominant Hand: Right    Extremity/Trunk Assessment             Cervical / Trunk Assessment Cervical / Trunk Assessment: Kyphotic  Communication    Communication: HOH  Cognition Arousal/Alertness: Awake/alert Behavior During Therapy: WFL for tasks assessed/performed Overall Cognitive Status: Within Functional Limits for tasks assessed                                        General Comments      Exercises Other Exercises Other Exercises: bil LE ROM exercise AAROM to AROM   Assessment/Plan    PT Assessment Patient needs continued PT services  PT Problem List Decreased strength;Decreased activity tolerance;Decreased balance;Decreased mobility;Decreased coordination;Pain       PT Treatment Interventions Gait training;DME instruction;Functional mobility training;Therapeutic activities;Balance training;Neuromuscular re-education;Wheelchair mobility training    PT Goals (Current goals can be found in the Care Plan section)  Acute Rehab PT Goals Patient Stated Goal: DIL wants pt to progress to mobility. PT Goal Formulation: With patient Time For Goal Achievement: 12/22/18 Potential to Achieve Goals: Good    Frequency Min 4X/week   Barriers to discharge        Co-evaluation PT/OT/SLP Co-Evaluation/Treatment: Yes Reason for Co-Treatment: Complexity of the patient's impairments (multi-system involvement) PT goals addressed during session: Mobility/safety with mobility         AM-PAC PT "6 Clicks" Mobility  Outcome Measure Help needed turning from your back to your side while in a flat bed without using bedrails?: Total Help needed moving from lying on your back to sitting on the side of a flat bed without using bedrails?: Total Help needed moving to and from a bed to a chair (including a wheelchair)?: A Lot Help needed standing up from a chair using your arms (e.g., wheelchair or bedside chair)?: Total Help needed to walk in hospital room?: Total Help needed climbing 3-5 steps with a railing? : Total 6 Click Score: 7    End of Session   Activity Tolerance: Patient tolerated treatment well Patient  left: in chair;with call bell/phone within reach;with chair alarm set;with family/visitor present Nurse Communication: Mobility status PT Visit Diagnosis: Unsteadiness on feet (R26.81);Repeated falls (R29.6);Hemiplegia and hemiparesis Hemiplegia - Right/Left: Left Hemiplegia - dominant/non-dominant: Non-dominant Hemiplegia - caused by: Cerebral infarction    Time: [id number] PT Time Calculation (min) (ACUTE ONLY): 46 min   Charges:   PT Evaluation $PT Eval Moderate Complexity: 1 Mod PT Treatments $Therapeutic Activity: 8-22 mins        12/29/2018  Donnella Sham, PT Acute Rehabilitation Services [phone]  (pager) [phone]  (office)  Tessie Fass Albert Devaul 12/29/2018, 6:20 PM

## 2018-12-30 DIAGNOSIS — I63411 Cerebral infarction due to embolism of right middle cerebral artery: Secondary | ICD-10-CM

## 2018-12-30 LAB — GLUCOSE, CAPILLARY
Glucose-Capillary: 125 mg/dL — ABNORMAL HIGH (ref 70–99)
Glucose-Capillary: 126 mg/dL — ABNORMAL HIGH (ref 70–99)
Glucose-Capillary: 152 mg/dL — ABNORMAL HIGH (ref 70–99)
Glucose-Capillary: 134 mg/dL — ABNORMAL HIGH (ref 70–99)
Glucose-Capillary: 153 mg/dL — ABNORMAL HIGH (ref 70–99)

## 2018-12-30 LAB — BASIC METABOLIC PANEL
Anion gap: 10 (ref 5–15)
BUN: 28 mg/dL — ABNORMAL HIGH (ref 8–23)
CO2: 18 mmol/L — ABNORMAL LOW (ref 22–32)
Calcium: 8.1 mg/dL — ABNORMAL LOW (ref 8.9–10.3)
Chloride: 110 mmol/L (ref 98–111)
Creatinine, Ser: 1.29 mg/dL — ABNORMAL HIGH (ref 0.44–1.00)
GFR calc Af Amer: 40 mL/min — ABNORMAL LOW (ref >60)
GFR calc non Af Amer: 34 mL/min — ABNORMAL LOW (ref >60)
Glucose, Bld: 125 mg/dL — ABNORMAL HIGH (ref 70–99)
Potassium: 3.9 mmol/L (ref 3.5–5.1)
Sodium: 138 mmol/L (ref 135–145)

## 2018-12-30 LAB — CBC
HCT: 32.1 % — ABNORMAL LOW (ref 36.0–46.0)
Hemoglobin: 10.9 g/dL — ABNORMAL LOW (ref 12.0–15.0)
MCH: 30.2 pg (ref 26.0–34.0)
MCHC: 34 g/dL (ref 30.0–36.0)
MCV: 88.9 fL (ref 80.0–100.0)
Platelets: 149 10*3/uL — ABNORMAL LOW (ref 150–400)
RBC: 3.61 MIL/uL — ABNORMAL LOW (ref 3.87–5.11)
RDW: 13.8 % (ref 11.5–15.5)
WBC: 8.3 10*3/uL (ref 4.0–10.5)
nRBC: 0 % (ref 0.0–0.2)

## 2018-12-30 MED ORDER — METOPROLOL TARTRATE 25 MG PO TABS
25.0000 mg | ORAL_TABLET | Freq: Two times a day (BID) | ORAL | Status: DC
  Administered 2018-12-30 – 2019-01-03 (×7): 25 mg via ORAL
  Filled 2018-12-30 (×7): qty 1

## 2018-12-30 MED ORDER — SODIUM CHLORIDE 0.9 % IV SOLN
250.0000 mL | INTRAVENOUS | Status: DC
  Administered 2018-12-30: 250 mL via INTRAVENOUS

## 2018-12-30 NOTE — Care Management Important Message (Signed)
Important Message  Patient Details  Name: Alexis Haas MRN: [id number] Date of Birth: Sep 16, 1917   Medicare Important Message Given:  Yes     [name] 12/30/2018, 12:47 PM

## 2018-12-30 NOTE — Progress Notes (Signed)
Inpatient Rehab Admissions:  Inpatient Rehab Consult received.  I met with patient and her daughter at the bedside for rehabilitation assessment and to discuss goals and expectations of an inpatient rehab admission.  Family is interested in CIR and are aware that they will need to provide 24/7 physical assist at discharge.  I will open insurance for authorization for possible admission next week pending approval and bed availability.   Signed: Shann Medal, PT, DPT Admissions Coordinator [phone] 12/30/18  11:10 AM

## 2018-12-30 NOTE — Progress Notes (Signed)
SLP Cancellation Note  Patient Details Name: Alexis Haas MRN: [id number] DOB: [birth year]   Cancelled treatment:       Reason Eval/Treat Not Completed: Patient unavailable for assessment of readiness to advance diet- currently with PT. Will continue efforts.  Carmela Rima, [place] Speech Language Pathologist Office: [phone] Pager: [phone]  Shonna Chock 12/30/2018, 12:07 PM

## 2018-12-30 NOTE — Consult Note (Signed)
Physical Medicine and Rehabilitation Consult Reason for Consult: Left side weakness Referring Physician: Dr.Xu   HPI: Alexis Haas is a 85 y.o. right-handed female with history of CAD maintained on aspirin, hypertension, hyperlipidemia, chronic kidney disease.  Per chart review patient lives alone.  Reportedly independent prior to admission using a straight point cane.  ADL with sponge bath.  Daughter does the grocery shopping.  1 level home with ramped entrance.  She has a son who checks on her daily.  Presented 12/27/2018 to [facility] with left side weakness as well as mild to moderate dysarthria.  EKG did show atrial fibrillation.  Admission chemistry showed creatinine 1.25, SARS coronavirus negative, urine culture 70,000 E. coli 40,000 Enterococcus.  Cranial CT scan showed atrophy with periventricular small vessel disease no acute infarct evident.  CT angiogram of head and neck showed right M2 occlusion and patient was transferred to [facility] and underwent revascularization per interventional radiology.  MRI of the brain showed confluent infarct tracking from the superior right temporal gyrus posteriorly to the lateral right occipital lobe.  Additional infarct in the right insula scattered in the right frontal lobe.  Punctate diffusion abnormality also in the contralateral left parietal lobe.  Chronic small cerebellar infarcts.  Echocardiogram with ejection fraction of 65% without emboli.  Neurology follow-up patient currently maintained on aspirin 325 mg daily as well as subcutaneous Lovenox for DVT prophylaxis.  Awaiting plan for anticoagulation to be considered in the next 5 days post stroke given this is of infarction.  Post stroke post stroke given the size of infarction.Currently on a dysphagia 2 diet.Request made for in patient rehab consult.  Alexis Haas is very pleasant and tells me she turned 89 in July. She tells me about her children, her husband, and her history working.  She has no complaints at this time.    Review of Systems  Constitutional: Negative for chills and fever.  Eyes: Negative for blurred vision and double vision.  Respiratory: Negative for cough and shortness of breath.   Cardiovascular: Positive for palpitations and leg swelling.  Gastrointestinal: Positive for constipation. Negative for heartburn, nausea and vomiting.       GERD  Genitourinary: Negative for dysuria, flank pain and hematuria.  Musculoskeletal: Positive for joint pain and myalgias.  Neurological: Positive for speech change and focal weakness.  All other systems reviewed and are negative.  Past Medical History:  Diagnosis Date  . Anemia   . Arthritis   . Cancer ([place]) 04/15/2016   T3, N1a adenocarcinoma of the hepatic flexure. No mismatch repair gene abnormality.   . Chronic kidney disease    LABS 3/18 COMPARED WITH 1 YEAR AGO  . Coronary artery disease   . Fall 2018  . GERD (gastroesophageal reflux disease)   . Heart disease   . Hypercholesteremia   . Hypertension   . Insomnia    Past Surgical History:  Procedure Laterality Date  . CARDIAC SURGERY  2005   bypass  . COLONOSCOPY WITH PROPOFOL N/A 04/15/2016   Procedure: COLONOSCOPY WITH PROPOFOL;  Surgeon: Lucilla Lame, MD;  Location: [facility] ENDOSCOPY;  Service: Endoscopy;  Laterality: N/A;  . CORONARY ARTERY BYPASS GRAFT  2005   x 2  . ESOPHAGOGASTRODUODENOSCOPY (EGD) WITH PROPOFOL N/A 04/15/2016   Procedure: ESOPHAGOGASTRODUODENOSCOPY (EGD) WITH PROPOFOL;  Surgeon: Lucilla Lame, MD;  Location: [facility] ENDOSCOPY;  Service: Endoscopy;  Laterality: N/A;  . LAPAROSCOPIC RIGHT COLECTOMY Right 06/05/2016   Procedure: LAPAROSCOPIC RIGHT COLECTOMY;  Surgeon: Dellis Filbert  Amedeo Kinsman, MD;  Location: [facility] ORS;  Service: General;  Laterality: Right;  . RADIOLOGY WITH ANESTHESIA N/A 12/27/2018   Procedure: CODE STROKE;  Surgeon: Radiologist, Medication, MD;  Location: [place];  Service: Radiology;  Laterality: N/A;  . toe removal    .  TONSILLECTOMY     age 72  . UMBILICAL HERNIA REPAIR  06/05/2016   Procedure: HERNIA REPAIR UMBILICAL ADULT;  Surgeon: Robert Bellow, MD;  Location: [facility] ORS;  Service: General;;   No family history on file. Social History:  reports that she has never smoked. She has never used smokeless tobacco. She reports that she does not drink alcohol or use drugs. Allergies:  Allergies  Allergen Reactions  . Amoxicillin Rash    Has patient had a PCN reaction causing immediate rash, facial/tongue/throat swelling, SOB or lightheadedness with hypotension:Yes Has patient had a PCN reaction causing severe rash involving mucus membranes or skin necrosis:unsure Has patient had a PCN reaction that required hospitalization:No Has patient had a PCN reaction occurring within the last 10 years:No If all of the above answers are "NO", then may proceed with Cephalosporin use.    Medications Prior to Admission  Medication Sig Dispense Refill  . acetaminophen (TYLENOL) 500 MG tablet Take 500 mg by mouth daily.    Marland Kitchen aspirin EC 81 MG tablet Take 81 mg by mouth every 4 (four) hours as needed.     . ferrous sulfate 325 (65 FE) MG tablet Take 325 mg by mouth daily with breakfast.    . metoprolol tartrate (LOPRESSOR) 25 MG tablet Take 25 mg by mouth daily.     . naproxen sodium (ANAPROX) 220 MG tablet Take 220 mg by mouth daily as needed (for pain).     Marland Kitchen omeprazole (PRILOSEC) 20 MG capsule Take 20 mg by mouth at bedtime.     . triamterene-hydrochlorothiazide (MAXZIDE-25) 37.5-25 MG tablet Take 1 tablet by mouth daily.      Home: Home Living Family/patient expects to be discharged to:: Private residence Living Arrangements: Alone Available Help at Discharge: Family, Available PRN/intermittently Type of Home: House Home Access: Ramped entrance Entrance Stairs-Rails: Can reach both [place]: One level Bathroom Shower/Tub: Chiropodist: [place]: Environmental consultant - 2 wheels, Toilet  riser, Cane - single point Additional Comments: SPC for mobility; Son daily check-in; Daughter groccery shops 1x weekly; no one spends the night  Lives With: Alone  Functional History: Prior Function Level of Independence: Independent Comments: Pt reports independence with ADL (sponge bath)- SOn daily check-in; Daughter groccery shops 1x weekly; no one spends the night  Functional Status:  Mobility: Bed Mobility Overal bed mobility: Needs Assistance Bed Mobility: Rolling, Supine to Sit Rolling: Max assist, +2 for physical assistance, +2 for safety/equipment Sidelying to sit: Max assist, +2 for physical assistance, +2 for safety/equipment Supine to sit: Max assist, +2 for physical assistance, +2 for safety/equipment General bed mobility comments: Pt assisting with rolling and moving BLEs over to edge, but maxA +2 required for trunk elevation, moving BLEs, and scooting bottom to EOB. Transfers Overall transfer level: Needs assistance Equipment used: Rolling walker (2 wheeled), None(sit to stand x3 times with RW and none for transfer) Transfers: Sit to/from Stand, [employer] Transfers Sit to Stand: Max assist, Total assist, +2 physical assistance, +2 safety/equipment, From elevated surface Squat pivot transfers: Total assist General transfer comment: Pt requiring maxA +2 for sit to stand - unable to stand- knees flexed unable to come all the way to standing.Marland Kitchen  face to face transfer to chair with significant assist Ambulation/Gait General Gait Details: not able today    ADL: ADL Overall ADL's : Needs assistance/impaired Eating/Feeding: Minimal assistance, Sitting, Bed level Eating/Feeding Details (indicate cue type and reason): weakness in arms for feeding Grooming: Moderate assistance, Sitting, Bed level Grooming Details (indicate cue type and reason):  weakness in BUEs Upper Body Bathing: Moderate assistance, Sitting, Bed level Lower Body Bathing: Maximal assistance, Total  assistance, +2 for physical assistance, +2 for safety/equipment, Sitting/lateral leans, Bed level Upper Body Dressing : Moderate assistance, Sitting Lower Body Dressing: Maximal assistance, Total assistance, Cueing for sequencing, Sitting/lateral leans, Bed level Toilet Transfer: Total assistance, +2 for physical assistance, +2 for safety/equipment, Squat-pivot Toilet Transfer Details (indicate cue type and reason): unable to stand upright Toileting- Clothing Manipulation and Hygiene: Maximal assistance, Total assistance, +2 for physical assistance, +2 for safety/equipment, Cueing for safety, Sitting/lateral lean, Bed level Functional mobility during ADLs: Total assistance, +2 for physical assistance, +2 for safety/equipment, Cueing for safety, Cueing for sequencing General ADL Comments: Pt limited by poor strength, decreased activity tolerance and decreased ability to care for self.  Cognition: Cognition Overall Cognitive Status: Within Functional Limits for tasks assessed Arousal/Alertness: Awake/alert Orientation Level: Oriented to person, Disoriented to time, Disoriented to situation, Oriented to place Attention: Sustained Sustained Attention: Appears intact Memory: (functional for basic) Awareness: Impaired Awareness Impairment: Anticipatory impairment Problem Solving: Appears intact Safety/Judgment: Impaired Cognition Arousal/Alertness: Awake/alert Behavior During Therapy: WFL for tasks assessed/performed Overall Cognitive Status: Within Functional Limits for tasks assessed  Blood pressure (!) 159/74, pulse (!) 109, temperature (!) 97.5 F (36.4 C), temperature source Oral, resp. rate 18, SpO2 93 %. Physical Exam  Gen: no distress, normal appearing HEENT: oral mucosa pink and moist, NCAT Cardio: Reg rate Chest: normal effort, normal rate of breathing Abd: soft, non-distended Ext: no edema Skin: intact Neuro/Musculoskeletal: Unable to answer orientation questions. She makes  jokes and laughs, she is often able to answer questions appropriately and follow commands but sometimes answers incorrectly and tangentially. Stated that she turned 89 in July and that she lives with her husband. She was able to lift her right arm but not her left. She was able to lift both her lower extremities slightly off the bed. Psych: pleasant, normal affect. At times coherent, at times confused.    Results for orders placed or performed during the hospital encounter of 12/27/18 (from the past 24 hour(s))  Glucose, capillary     Status: Abnormal   Collection Time: 12/29/18  1:27 PM  Result Value Ref Range   Glucose-Capillary 129 (H) 70 - 99 mg/dL   Comment 1 Notify RN    Comment 2 Document in Chart   Glucose, capillary     Status: Abnormal   Collection Time: 12/29/18  3:18 PM  Result Value Ref Range   Glucose-Capillary 116 (H) 70 - 99 mg/dL   Comment 1 Notify RN    Comment 2 Document in Chart   Glucose, capillary     Status: Abnormal   Collection Time: 12/29/18  9:21 PM  Result Value Ref Range   Glucose-Capillary 151 (H) 70 - 99 mg/dL   Comment 1 Notify RN    Comment 2 Document in Chart   Basic metabolic panel     Status: Abnormal   Collection Time: 12/30/18  3:55 AM  Result Value Ref Range   Sodium 138 135 - 145 mmol/L   Potassium 3.9 3.5 - 5.1 mmol/L   Chloride 110 98 - 111  mmol/L   CO2 18 (L) 22 - 32 mmol/L   Glucose, Bld 125 (H) 70 - 99 mg/dL   BUN 28 (H) 8 - 23 mg/dL   Creatinine, Ser 1.29 (H) 0.44 - 1.00 mg/dL   Calcium 8.1 (L) 8.9 - 10.3 mg/dL   GFR calc non Af Amer 34 (L) >60 mL/min   GFR calc Af Amer 40 (L) >60 mL/min   Anion gap 10 5 - 15  CBC     Status: Abnormal   Collection Time: 12/30/18  3:55 AM  Result Value Ref Range   WBC 8.3 4.0 - 10.5 K/uL   RBC 3.61 (L) 3.87 - 5.11 MIL/uL   Hemoglobin 10.9 (L) 12.0 - 15.0 g/dL   HCT 32.1 (L) 36.0 - 46.0 %   MCV 88.9 80.0 - 100.0 fL   MCH 30.2 26.0 - 34.0 pg   MCHC 34.0 30.0 - 36.0 g/dL   RDW 13.8 11.5 - 15.5 %    Platelets 149 (L) 150 - 400 K/uL   nRBC 0.0 0.0 - 0.2 %  Glucose, capillary     Status: Abnormal   Collection Time: 12/30/18  6:05 AM  Result Value Ref Range   Glucose-Capillary 125 (H) 70 - 99 mg/dL   Comment 1 Notify RN    Comment 2 Document in Chart   Glucose, capillary     Status: Abnormal   Collection Time: 12/30/18  7:55 AM  Result Value Ref Range   Glucose-Capillary 126 (H) 70 - 99 mg/dL   Alexis Virgel Paling Haas Contrast  Result Date: 12/28/2018 CLINICAL DATA:  85 year old female stroke presentation with proximal right MCA M2 occlusion treated with endovascular reperfusion. EXAM: MRA HEAD WITHOUT CONTRAST TECHNIQUE: Angiographic images of the Circle of Willis were obtained using MRA technique without intravenous contrast. COMPARISON:  Brain MRI today. CTA head and neck and CT Perfusion yesterday. FINDINGS: Antegrade flow in the posterior circulation with no distal vertebral artery stenosis. Patent left PICA and bilateral AICA origins. Patent basilar artery without stenosis. Patent SCA and PCA origins. Both posterior communicating arteries are present. Left PCA branches are within normal limits. Stable moderate to severe right PCA P1 and P2 stenoses with preserved distal flow. Antegrade flow in both ICA siphons. Moderate to severe bilateral siphon irregularity. Mild to moderate left anterior genu stenosis. Mild right supraclinoid ICA stenosis. Ophthalmic and posterior communicating artery origins are within normal limits. Patent carotid termini. Dominant right ACA A1 segment. Visible ACA branches are stable and within normal limits. Left MCA M1 segment, bifurcation, and left MCA branches are stable without stenosis. Right MCA M1 segment and bifurcation are patent without stenosis. The dominant posterior right M2 branch now is patent without stenosis. Multiple additional patent right M2 and M3 branches compared to the CTA yesterday. IMPRESSION: 1. Revascularization of the Right MCA branches since the  CTA yesterday. No new or recurrent occlusion identified. 2. Extensive bilateral ICA siphon atherosclerosis with stable mild to moderate stenosis greater on the left. 3. 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 Electronically Signed   By: Genevie Ann M.D.   On: 12/28/2018 22:44   Alexis Haas Contrast  Result Date: 12/28/2018 CLINICAL DATA:  85 year old female stroke presentation with proximal right MCA M2 occlusion treated with endovascular reperfusion. EXAM: MRI HEAD WITHOUT CONTRAST TECHNIQUE: Multiplanar, multiecho pulse sequences of the brain and surrounding structures were obtained without intravenous contrast. COMPARISON:  CTA and CT Perfusion 12/27/2018. FINDINGS: Brain: Confluent restricted diffusion from the superior right temporal gyrus  tracking posteriorly into the lateral right occipital lobe (series 5, image 80). Confluent cytotoxic edema in that area with mild petechial hemorrhage (series 14, image 29, Heidelberg Classification 1A). Additional restricted diffusion in the right insula and frontal operculum, occasionally scattered elsewhere in the right frontal lobe including the white matter and some of the right motor strip on series 5, image 98. There may also be a punctate area of cortical restricted diffusion in the medial left parietal lobe on series 5, image 89. The deep gray nuclei are spared. No intracranial mass effect or midline shift at this time. No ventriculomegaly. No definite chronic cerebral blood products. There are small chronic bilateral cerebellar infarcts. Patchy bilateral white matter T2 and FLAIR hyperintensity. Negative pituitary and cervicomedullary junction. Vascular: Major intracranial vascular flow voids are preserved. Skull and upper cervical spine: Negative for age visible cervical spine. Visualized bone marrow signal is within normal limits. Sinuses/Orbits: Negative, postoperative changes to the globes. Other: Trace mastoid fluid. Small benign midline nasopharynx  retention cyst (series 10, image 5). IMPRESSION: 1. Confluent infarct tracking from the superior right temporal gyrus posteriorly through the lateral right occipital lobe. Additional infarct in the right insula, scattered in the right frontal lobe. 2. Cytotoxic edema and Heidelberg Classification 1A petechial blood. No intracranial mass effect. 3. Punctate diffusion abnormality also in the contralateral left parietal lobe. Chronic small cerebellar infarcts. Electronically Signed   By: Genevie Ann M.D.   On: 12/28/2018 22:24   Dg Chest Port 1 View  Result Date: 12/29/2018 CLINICAL DATA:  85 year old female with respiratory failure. EXAM: PORTABLE CHEST 1 VIEW COMPARISON:  Chest radiograph dated 12/27/2018. FINDINGS: Interval removal of the endotracheal tube. Left lung base atelectasis. Overall no interval change in the appearance of the lungs since the prior radiograph. No pneumothorax. Stable cardiac silhouette. Atherosclerotic calcification of the aorta. Median sternotomy wires and CABG vascular clips. No acute osseous pathology. IMPRESSION: Interval extubation. No interval change in the appearance of the lungs since the prior radiograph. Electronically Signed   By: Anner Crete M.D.   On: 12/29/2018 08:44    Assessment/Plan: Diagnosis: Impaired mobility and ADLs secondary to right middle cerebral artery embolism. 1. Does the need for close, 24 hr/day medical supervision in concert with the patient's rehab needs make it unreasonable for this patient to be served in a less intensive setting? Yes 2. Co-Morbidities requiring supervision/potential complications: iron deficiency anemia 3. Due to bladder management, bowel management, safety, skin/wound care, disease management, medication administration, pain management and patient education, does the patient require 24 hr/day rehab nursing? Yes 4. Does the patient require coordinated care of a physician, rehab nurse, therapy disciplines of PT, OT, SLP to  address physical and functional deficits in the context of the above medical diagnosis(es)? Yes Addressing deficits in the following areas: balance, endurance, locomotion, strength, transferring, bowel/bladder control, bathing, dressing, feeding, grooming, toileting, cognition, speech, language and psychosocial support 5. Can the patient actively participate in an intensive therapy program of at least 3 hrs of therapy per day at least 5 days per week? Yes 6. The potential for patient to make measurable gains while on inpatient rehab is fair 7. Anticipated functional outcomes upon discharge from inpatient rehab are min assist  with PT, min assist with OT, supervision with SLP. 8. Estimated rehab length of stay to reach the above functional goals is: 10-14 days 9. Anticipated discharge destination: Home 10. Overall Rehab/Functional Prognosis: fair  RECOMMENDATIONS: This patient's condition is appropriate for continued rehabilitative care in the  following setting: CIR Patient has agreed to participate in recommended program. Yes Note that insurance prior authorization may be required for reimbursement for recommended care.  Comment: Currently MaxAx2 for bed mobility. Has not yet ambulated. She lives alone at home. It will be important for [place] to discuss with her children how much support they will be able to provide her upon eventual discharge home.   Lavon Paganini Angiulli, PA-C 12/30/2018   I have personally performed a face to face diagnostic evaluation, including, but not limited to relevant history and physical exam findings, of this patient and developed relevant assessment and plan.  Additionally, I have reviewed and concur with the physician assistant's documentation above. Would be beneficial to do pill box trials with patient to assess how she will manage her medication at home. Unclear how much of her cognitive impairment is related to acute stroke vs. chronic dementia and her children may be  able to provide further insight into this.   Leeroy Cha, MD

## 2018-12-30 NOTE — Progress Notes (Signed)
SLP Consult Note  Patient Details Name: CAMBRY SPAMPINATO MRN: [id number] DOB: Jun 04, 1917   SLP met with pt's daughter briefly in the hall. Pt seen by this SLP in ICU for BSE with diet recommendation for Dys 2 solids and thin liquids. SLP attempted earlier today to assess readiness to advance solids, however, pt was unavailable. Daughter reports pt fatigues easily, and has had variable intake - better today. SLP provided education regarding determining readiness to advance solids, benefit of rest and need for continued therapies. Daughter indicated understanding. Will continue to follow to assess readiness for advanced solids.  Carmela Rima, [place] Speech Language Pathologist Office: [phone] Pager: [phone]  Shonna Chock 12/30/2018, 3:57 PM

## 2018-12-30 NOTE — Progress Notes (Signed)
Physical Therapy Treatment Patient Details Name: Alexis Haas MRN: [id number] DOB: [birth year] Today's Date: 12/30/2018    History of Present Illness  Pt is a 40 yoF originally presenting to [facility] with left sided weakness and AMS found to have acute right M2 occlusion. LSW 11/15 around 1800.  Transferred to [facility] for endovascular repair with partial revascularization of R MCA. PMHx: CAD s/p CABG 2005, HTN, HLD, anemia, CKD, arthritis.    PT Comments    Session focused on sitting balance and transfer training. Requiring two person maximal assist to stand to walker but unable to initiate steps due to fear of falling and weakness. Trialed Stedy for transfer to chair, however, due to pt significant decreased attention/awareness, she was unable to sequence for use, requiring heavy + 2 assist to boost up. Continues with left sided weakness (left upper extremity weaker than lower). Continue to recommend comprehensive inpatient rehab (CIR) for post-acute therapy needs.     Follow Up Recommendations   CIR;Supervision/Assistance - 24 hour     Equipment Recommendations   Wheelchair (measurements PT);Wheelchair cushion (measurements PT)    Recommendations for Other Services       Precautions / Restrictions Precautions Precautions:  Fall Restrictions Weight Bearing Restrictions: No    Mobility  Bed Mobility Overal bed mobility:  Needs Assistance Bed Mobility:  Supine to Sit   Sidelying to sit: Max assist       General bed mobility comments:  Requires assist for bring BLE's over to edge, trunk elevation  Transfers Overall transfer level: Needs assistance Equipment used: Rolling walker (2 wheeled);Ambulation equipment used Transfers: Sit to/from Stand Sit to Stand: Max assist;+2 physical assistance;Total assist         General transfer comment: Initially attempted standing from edge of bed to walker with cues for hand placement. Pt requiring increased time for initiation, fear of  falling once standing and requesting to sit back down despite cues for upright. Attempted use of Stedy, however, pt with significant difficulty sequencing and attending, requiring totalA to boost up to stand enough to get flaps underneath.   Ambulation/Gait                 Stairs             Wheelchair Mobility    Modified Rankin (Stroke Patients Only) Modified Rankin (Stroke Patients Only) Pre-Morbid Rankin Score:  No symptoms Modified Rankin:  Severe disability     Balance Overall balance assessment: (P) Needs assistance Sitting-balance support: (P) Single extremity supported;Feet supported Sitting balance-Leahy Scale: (P) Poor Sitting balance - Comments: (P) posterior lean, requiring min-mod assist   Standing balance support: (P) Bilateral upper extremity supported Standing balance-Leahy Scale: (P) Zero                              Cognition Arousal/Alertness: (P) Awake/alert Behavior During Therapy: (P) WFL for tasks assessed/performed Overall Cognitive Status: (P) Impaired/Different from baseline Area of Impairment: (P) Orientation;Attention;Following commands;Safety/judgement;Awareness;Problem solving                 Orientation Level: (P) Disoriented to;Place;Time;Situation Current Attention Level: (P) Focused;Sustained   Following Commands: (P) Follows one step commands inconsistently Safety/Judgement: (P) Decreased awareness of safety;Decreased awareness of deficits Awareness: (P) Intellectual Problem Solving: (P) Decreased initiation;Requires verbal cues;Requires tactile cues General Comments: (P) Pt with signficantly decreased attention span, highly distractable by lines/environmental distractions, which inhibits ability to follow simple commands consistently. Oriented to self  only, stating she was in her kitchen. Decreased awareness of deficits, asking to go home. Unable to bring insight into level of severity of her deficits.       Exercises      General Comments        Pertinent Vitals/Pain Pain Assessment: (P) Faces Faces Pain Scale: (P) Hurts little more Pain Location: (P) right knee with attempts to flex (chronic) Pain Descriptors / Indicators: (P) Grimacing Pain Intervention(s): (P) Monitored during session;Repositioned    Home Living                      Prior Function            PT Goals (current goals can now be found in the care plan section) Acute Rehab PT Goals Patient Stated Goal: (P) Daughter is interested in CIR Potential to Achieve Goals: (P) Good    Frequency    (P) Min 4X/week      PT Plan (P) Current plan remains appropriate    Co-evaluation              AM-PAC PT "6 Clicks" Mobility   Outcome Measure  Help needed turning from your back to your side while in a flat bed without using bedrails?: (P) Total Help needed moving from lying on your back to sitting on the side of a flat bed without using bedrails?: (P) Total Help needed moving to and from a bed to a chair (including a wheelchair)?: (P) Total Help needed standing up from a chair using your arms (e.g., wheelchair or bedside chair)?: (P) Total Help needed to walk in hospital room?: (P) Total Help needed climbing 3-5 steps with a railing? : (P) Total 6 Click Score: (P) 6    End of Session Equipment Utilized During Treatment: (P) Gait belt Activity Tolerance: (P) Patient tolerated treatment well Patient left: (P) in chair;with call bell/phone within reach;with chair alarm set;with family/visitor present Nurse Communication: (P) Mobility status PT Visit Diagnosis: (P) Unsteadiness on feet (R26.81);Repeated falls (R29.6);Hemiplegia and hemiparesis Hemiplegia - Right/Left: (P) Left Hemiplegia - dominant/non-dominant: (P) Non-dominant Hemiplegia - caused by: (P) Cerebral infarction     Time: 1205-1239 PT Time Calculation (min) (ACUTE ONLY): 34 min  Charges:  $Therapeutic Activity: (P) 23-37  mins                    Ellamae Sia, PT, DPT Acute Rehabilitation Services Pager [phone] Office [phone]    Willy Eddy 12/30/2018, 5:28 PM

## 2018-12-30 NOTE — Progress Notes (Signed)
0300- pt bladder scanned for 484ml; I&O cath for 540ml. Urine yellow, clear with no odor noted. Pt noted to have thick creamy vaginal discharge during peri-care prior to in and out cath. Peri-care done again after cath. Foley order received from on-call MD but will leave to AM RN, unit leadership and attending MD to re-evaluate foley need. Delia Heady RN

## 2018-12-31 ENCOUNTER — Encounter (HOSPITAL_COMMUNITY): Payer: Self-pay | Admitting: Interventional Radiology

## 2018-12-31 DIAGNOSIS — R338 Other retention of urine: Secondary | ICD-10-CM

## 2018-12-31 LAB — BASIC METABOLIC PANEL
Anion gap: 11 (ref 5–15)
BUN: 23 mg/dL (ref 8–23)
CO2: 18 mmol/L — ABNORMAL LOW (ref 22–32)
Calcium: 8.1 mg/dL — ABNORMAL LOW (ref 8.9–10.3)
Chloride: 108 mmol/L (ref 98–111)
Creatinine, Ser: 1.22 mg/dL — ABNORMAL HIGH (ref 0.44–1.00)
GFR calc Af Amer: 42 mL/min — ABNORMAL LOW (ref >60)
GFR calc non Af Amer: 37 mL/min — ABNORMAL LOW (ref >60)
Glucose, Bld: 134 mg/dL — ABNORMAL HIGH (ref 70–99)
Potassium: 4.1 mmol/L (ref 3.5–5.1)
Sodium: 137 mmol/L (ref 135–145)

## 2018-12-31 LAB — GLUCOSE, CAPILLARY
Glucose-Capillary: 115 mg/dL — ABNORMAL HIGH (ref 70–99)
Glucose-Capillary: 120 mg/dL — ABNORMAL HIGH (ref 70–99)
Glucose-Capillary: 108 mg/dL — ABNORMAL HIGH (ref 70–99)
Glucose-Capillary: 112 mg/dL — ABNORMAL HIGH (ref 70–99)

## 2018-12-31 LAB — CBC
HCT: 33 % — ABNORMAL LOW (ref 36.0–46.0)
Hemoglobin: 11.3 g/dL — ABNORMAL LOW (ref 12.0–15.0)
MCH: 30.3 pg (ref 26.0–34.0)
MCHC: 34.2 g/dL (ref 30.0–36.0)
MCV: 88.5 fL (ref 80.0–100.0)
Platelets: 159 10*3/uL (ref 150–400)
RBC: 3.73 MIL/uL — ABNORMAL LOW (ref 3.87–5.11)
RDW: 13.7 % (ref 11.5–15.5)
WBC: 8.2 10*3/uL (ref 4.0–10.5)
nRBC: 0 % (ref 0.0–0.2)

## 2018-12-31 MED ORDER — APIXABAN 5 MG PO TABS
5.0000 mg | ORAL_TABLET | Freq: Two times a day (BID) | ORAL | Status: DC
  Administered 2018-12-31 – 2019-01-03 (×6): 5 mg via ORAL
  Filled 2018-12-31 (×6): qty 1

## 2018-12-31 NOTE — TOC Initial Note (Signed)
Transition of Care [facility]) - Initial/Assessment Note    Patient Details  Name: Alexis Haas MRN: [id number] Date of Birth: 23-Aug-1917  Transition [facility]) CM/SW Contact:    Pollie Friar, RN Phone Number: 12/31/2018, 4:34 PM  Clinical Narrative:                 Pt was denied CIR. Daughter has decided to take her home with [facility] services (pts home). Choice provided and [facility] selected. Cherly with [facility] accepted the referral.  Daughter asking for hospital bed, wheelchair and 3 n 1. Zack with [facility] made aware.  Pt will need to transport via [facility] at d/c. TOC following for further d/c needs.   Expected Discharge Plan: [place] Barriers to Discharge: Continued Medical Work up   Patient Goals and CMS Choice   CMS Medicare.gov Compare Post Acute Care list provided to:: Patient Represenative (must comment) Choice offered to / list presented to : Adult Children  Expected Discharge Plan and Services Expected Discharge Plan: [place]   Discharge Planning Services: CM Consult Post Acute Care Choice: Home Health, Durable Medical Equipment Living arrangements for the past 2 months: [place]                 DME Arranged: 3-N-1, Wheelchair manual, Hospital bed DME Agency: [facility]       HH Arranged: RN, PT, OT, Nurse's Aide [place] Agency: [place] Date [place]: 12/31/18   Representative spoke with at [place]: Malachy Mood  Prior Living Arrangements/Services Living arrangements for the past 2 months: [place] Lives with:: Self Patient language and need for interpreter reviewed:: Yes Do you feel safe going back to the place where you live?: Yes      Need for Family Participation in Patient Care: Yes (Comment) Care giver support system in place?: Yes (comment)(daughter)   Criminal Activity/Legal Involvement Pertinent to Current Situation/Hospitalization: No - Comment as  needed  Activities of Daily Living      Permission Sought/Granted                  Emotional Assessment Appearance:: Appears stated age Attitude/Demeanor/Rapport: Engaged Affect (typically observed): Accepting, Pleasant Orientation: : Oriented to Self   Psych Involvement: No (comment)  Admission diagnosis:  Stroke [facility]) [I63.9] Cerebrovascular accident (CVA), unspecified mechanism ([place]) [I63.9] Stroke (cerebrum) [facility]) [I63.9] Patient Active Problem List   Diagnosis Date Noted  . Stroke (cerebrum) ([place]) 12/27/2018  . 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 Liver metastasis ([place]) 09/17/2018  . Elevated CEA 08/30/2018  . Goals of care, counseling/discussion 06/17/2017  . Hypocalcemia 03/21/2017  . Cancer of ascending colon ([place]) 06/05/2016  . Colon cancer, ascending ([place]) 05/21/2016  . Iron deficiency anemia due to chronic blood loss   . Acute gastric ulcer without hemorrhage or perforation   . Stenosis of intestine ([place])   . Weight loss 11/07/2015  . Iron deficiency anemia 01/10/2015   PCP:  Lynnell Jude, MD Pharmacy:   [facility] [address] [place] [address] Phone: [phone] Fax: [phone]     Social Determinants of Health (SDOH) Interventions    Readmission Risk Interventions No flowsheet data found.

## 2018-12-31 NOTE — Progress Notes (Signed)
Telemetry called RN to report pt brady down to 39 and second degree HB but HR back up to 59. Pt assessed to be sleeping comfortably; asymptomatic and call light within reach. Will report off to oncoming RN. Delia Heady RN

## 2018-12-31 NOTE — Progress Notes (Signed)
Inpatient Rehab Admissions Coordinator:   Received notification from [facility] that request for CIR has been denied.  Pt will need to pursue rehab at another level of care.  I will let CSW and family know.   Shann Medal, PT, DPT Admissions Coordinator [phone] 12/31/18  12:18 PM

## 2018-12-31 NOTE — Progress Notes (Signed)
Physical Therapy Treatment Patient Details Name: Alexis Haas MRN: [id number] DOB: 10/24/1917 Today's Date: 12/31/2018    History of Present Illness Pt is a 49 yoF originally presenting to [facility] with left sided weakness and AMS found to have acute right M2 occlusion. LSW 11/15 around 1800.  Transferred to [facility] for endovascular repair with partial revascularization of R MCA. PMHx: CAD s/p CABG 2005, HTN, HLD, anemia, CKD, arthritis.    PT Comments    Pt progressing slowly towards physical therapy goals. Requiring two person total assist for low pivot transfer towards left. Continues with left sided weakness, inattention, poor sitting balance, and decreased cognition. Needs multimodal cueing for attending to tasks. Updated d/c plan.     Follow Up Recommendations  Supervision/Assistance - 24 hour;SNF     Equipment Recommendations  Wheelchair (measurements PT);Wheelchair cushion (measurements PT);Other (comment)(hoyer lift)    Recommendations for Other Services       Precautions / Restrictions Precautions Precautions: Fall Precaution Comments: L inattention, hemiplegia Restrictions Weight Bearing Restrictions: No    Mobility  Bed Mobility Overal bed mobility: Needs Assistance Bed Mobility: Supine to Sit   Sidelying to sit: +2 for physical assistance;+2 for safety/equipment;Total assist Supine to sit: +2 for physical assistance;+2 for safety/equipment;Total assist     General bed mobility comments: total A +2, helicoptor style with bed pads due to pt difficulty initiating t/f on L side of environment  Transfers Overall transfer level: Needs assistance Equipment used: None;2 person hand held assist Transfers: Sit to/from [employer] Sit to Stand: +2 physical assistance;+2 safety/equipment;Max assist   Squat pivot transfers: Total assist;+2 physical assistance;+2 safety/equipment     General transfer comment: maxA + 2 for initial sit <> stand. Pt feet  extended out in front of her despite knee and foot block, not able to achieve full standing; increased left knee flexion. Squat pivot compelted in anterior/posterior fashion +2 with heavy use of bed pads. No initiation from pt noted  Ambulation/Gait                 Stairs             Wheelchair Mobility    Modified Rankin (Stroke Patients Only) Modified Rankin (Stroke Patients Only) Pre-Morbid Rankin Score: No symptoms Modified Rankin: Severe disability     Balance Overall balance assessment: Needs assistance Sitting-balance support: Bilateral upper extremity supported Sitting balance-Leahy Scale: Poor Sitting balance - Comments: heavy posterior lean throughout most of sitting EOB, able to maintain balance when given task to engage in anteriorly   Standing balance support: Bilateral upper extremity supported Standing balance-Leahy Scale: Zero Standing balance comment: unable to achieve full standing despite total A +2 support                            Cognition Arousal/Alertness: Awake/alert Behavior During Therapy: WFL for tasks assessed/performed Overall Cognitive Status: Impaired/Different from baseline Area of Impairment: Orientation;Attention;Memory;Following commands;Safety/judgement;Awareness;Problem solving                 Orientation Level: Disoriented to;Time;Situation;Place Current Attention Level: Focused Memory: Decreased short-term memory Following Commands: Follows one step commands inconsistently;Follows one step commands with increased time Safety/Judgement: Decreased awareness of deficits;Decreased awareness of safety Awareness: Intellectual Problem Solving: Slow processing;Decreased initiation;Difficulty sequencing;Requires verbal cues;Requires tactile cues General Comments: pt unaware that she had a stroke; will stay on same topic for several minutes even though new topic has been initiated. When asking her new question  she  will respond with the answer to the previous question or perseverate on it. Pt has poor attention and needs consistent step by step multimodal cues to complete mobility and transfers successfully      Exercises Other Exercises Other Exercises: Dynamic seated balance task of applying lotion to legs to promote anterior weight shift    General Comments        Pertinent Vitals/Pain Pain Assessment: Faces Faces Pain Scale: Hurts a little bit Pain Location: knees, generalized (difficulty specifying) Pain Descriptors / Indicators: Aching;Sore Pain Intervention(s): Monitored during session;Limited activity within patient's tolerance;Repositioned    Home Living                      Prior Function            PT Goals (current goals can now be found in the care plan section) Acute Rehab PT Goals Patient Stated Goal: to take a nap Potential to Achieve Goals: Fair    Frequency    Min 3X/week      PT Plan Discharge plan needs to be updated;Frequency needs to be updated    Co-evaluation PT/OT/SLP Co-Evaluation/Treatment: Yes Reason for Co-Treatment: Complexity of the patient's impairments (multi-system involvement);Necessary to address cognition/behavior during functional activity;For patient/therapist safety;To address functional/ADL transfers PT goals addressed during session: Mobility/safety with mobility OT goals addressed during session: ADL's and self-care      AM-PAC PT "6 Clicks" Mobility   Outcome Measure  Help needed turning from your back to your side while in a flat bed without using bedrails?: Total Help needed moving from lying on your back to sitting on the side of a flat bed without using bedrails?: Total Help needed moving to and from a bed to a chair (including a wheelchair)?: Total Help needed standing up from a chair using your arms (e.g., wheelchair or bedside chair)?: Total Help needed to walk in hospital room?: Total Help needed climbing 3-5  steps with a railing? : Total 6 Click Score: 6    End of Session Equipment Utilized During Treatment: Gait belt Activity Tolerance: Patient tolerated treatment well Patient left: in chair;with call bell/phone within reach;with chair alarm set Nurse Communication: Mobility status PT Visit Diagnosis: Unsteadiness on feet (R26.81);Repeated falls (R29.6);Hemiplegia and hemiparesis Hemiplegia - Right/Left: Left Hemiplegia - dominant/non-dominant: Non-dominant Hemiplegia - caused by: Cerebral infarction     Time: [id number] PT Time Calculation (min) (ACUTE ONLY): 35 min  Charges:  $Therapeutic Activity: 8-22 mins                     Ellamae Sia, PT, DPT Acute Rehabilitation Services Pager [phone] Office [phone]    Willy Eddy 12/31/2018, 2:54 PM

## 2018-12-31 NOTE — Progress Notes (Signed)
ANTICOAGULATION CONSULT NOTE - Initial Consult  Pharmacy Consult for apixaban Indication: atrial fibrillation  Allergies  Allergen Reactions  . Amoxicillin Rash    Has patient had a PCN reaction causing immediate rash, facial/tongue/throat swelling, SOB or lightheadedness with hypotension:Yes Has patient had a PCN reaction causing severe rash involving mucus membranes or skin necrosis:unsure Has patient had a PCN reaction that required hospitalization:No Has patient had a PCN reaction occurring within the last 10 years:No If all of the above answers are "NO", then may proceed with Cephalosporin use.      Vital Signs: Temp: 97.8 F (36.6 C) (11/20 1545) Temp Source: Oral (11/20 1545) BP: 145/69 (11/20 1545) Pulse Rate: 78 (11/20 1545)  Labs: Recent Labs    12/29/18 0439 12/30/18 0355 12/31/18 0244  HGB 12.2 10.9* 11.3*  HCT 37.2 32.1* 33.0*  PLT 140* 149* 159  CREATININE 1.32* 1.29* 1.22*    Estimated Creatinine Clearance: 26.3 mL/min (A) (by C-G formula based on SCr of 1.22 mg/dL (H)).   Medical History: Past Medical History:  Diagnosis Date  . Anemia   . Arthritis   . Cancer ([place]) 04/15/2016   T3, N1a adenocarcinoma of the hepatic flexure. No mismatch repair gene abnormality.   . Chronic kidney disease    LABS 3/18 COMPARED WITH 1 YEAR AGO  . Coronary artery disease   . Fall 2018  . GERD (gastroesophageal reflux disease)   . Heart disease   . Hypercholesteremia   . Hypertension   . Insomnia     Medications:  Medications Prior to Admission  Medication Sig Dispense Refill Last Dose  . acetaminophen (TYLENOL) 500 MG tablet Take 500 mg by mouth daily.   12/27/2018 at Unknown time  . aspirin EC 81 MG tablet Take 81 mg by mouth every 4 (four) hours as needed.    12/27/2018 at Unknown time  . ferrous sulfate 325 (65 FE) MG tablet Take 325 mg by mouth daily with breakfast.   12/27/2018 at Unknown time  . metoprolol tartrate (LOPRESSOR) 25 MG tablet Take 25 mg  by mouth daily.    12/27/2018 at Unknown time  . naproxen sodium (ANAPROX) 220 MG tablet Take 220 mg by mouth daily as needed (for pain).    Past Week at Unknown time  . omeprazole (PRILOSEC) 20 MG capsule Take 20 mg by mouth at bedtime.    12/26/2018 at Unknown time  . triamterene-hydrochlorothiazide (MAXZIDE-25) 37.5-25 MG tablet Take 1 tablet by mouth daily.   12/27/2018 at Unknown time   Scheduled:  .  stroke: mapping our early stages of recovery book   Does not apply Once  . atorvastatin  20 mg Oral q1800  . chlorhexidine gluconate (MEDLINE KIT)  15 mL Mouth Rinse BID  . Chlorhexidine Gluconate Cloth  6 each Topical Daily  . ferrous sulfate  325 mg Oral Q breakfast  . insulin aspart  0-6 Units Subcutaneous TID WC  . metoprolol tartrate  25 mg Oral BID  . pantoprazole  40 mg Oral Daily    Assessment: 85 yo female with CVA s/p revascularization 11/16. Pharmacy consulted to dose apixaban. Aspirin has been discontinued. -Wt= 77kg, SCr= 1.22  (baseline ~ 1.1>>1.3), Hg= 11.3  Goal of Therapy:  Monitor platelets by anticoagulation protocol: Yes   Plan:  -apixaban 85m bid -Will follow patient progress  AHildred Laser PharmD Clinical Pharmacist **Pharmacist phone directory can now be found on aPiedra Aguzacom (PW [email or web address]).  [place]

## 2018-12-31 NOTE — Plan of Care (Signed)
Patient stable, discussed POC with patient, agreeable with plan, denies question/concerns at this time.   Problem: Education: Goal: Knowledge of disease or condition will improve Outcome: Progressing Goal: Knowledge of secondary prevention will improve Outcome: Progressing Goal: Knowledge of patient specific risk factors addressed and post discharge goals established will improve Outcome: Progressing Goal: Individualized Educational Video(s) Outcome: Progressing

## 2018-12-31 NOTE — Progress Notes (Signed)
STROKE TEAM PROGRESS NOTE   INTERVAL HISTORY Daughter at bedside, pt is sitting in chair, much more awake alert and talkative. Still has left arm weakness but left leg strength continue to improve. Insurance denied CIR and family would like to take her home.       Vitals:   12/31/18 0000 12/31/18 0401 12/31/18 0800 12/31/18 1100  BP: 140/63 (!) 116/46 (!) 159/77 123/65  Pulse: 70 (!) 47 79 64  Resp: 19 19 20 18   Temp: 97.7 F (36.5 C) 98.5 F (36.9 C) 98.3 F (36.8 C) 97.8 F (36.6 C)  TempSrc: Oral Axillary Axillary Axillary  SpO2: 97%  97%     CBC:  Recent Labs  Lab 12/27/18 0943  12/28/18 0354  12/30/18 0355 12/31/18 0244  WBC 12.2*  --  13.7*   < > 8.3 8.2  NEUTROABS 10.5*  --  11.8*  --   --   --   HGB 15.6*   < > 13.2   < > 10.9* 11.3*  HCT 45.2   < > 38.8   < > 32.1* 33.0*  MCV 85.0  --  89.2   < > 88.9 88.5  PLT 211  --  203   < > 149* 159   < > = values in this interval not displayed.    Basic Metabolic Panel:  Recent Labs  Lab 12/28/18 0354 12/29/18 0439 12/30/18 0355 12/31/18 0244  NA 137 139 138 137  K 3.7 3.3* 3.9 4.1  CL 103 107 110 108  CO2 18* 21* 18* 18*  GLUCOSE 169* 90 125* 134*  BUN 21 26* 28* 23  CREATININE 1.40* 1.32* 1.29* 1.22*  CALCIUM 7.9* 7.9* 8.1* 8.1*  MG 1.7 2.2  --   --    Lipid Panel:     Component Value Date/Time   CHOL 186 12/28/2018 0354   TRIG 120 12/28/2018 0354   HDL 44 12/28/2018 0354   CHOLHDL 4.2 12/28/2018 0354   VLDL 24 12/28/2018 0354   LDLCALC 118 (H) 12/28/2018 0354   HgbA1c:  Lab Results  Component Value Date   HGBA1C 6.1 (H) 12/28/2018   Urine Drug Screen:     Component Value Date/Time   LABOPIA NONE DETECTED 12/27/2018 1023   COCAINSCRNUR NONE DETECTED 12/27/2018 1023   LABBENZ NONE DETECTED 12/27/2018 1023   AMPHETMU NONE DETECTED 12/27/2018 1023   THCU NONE DETECTED 12/27/2018 1023   LABBARB NONE DETECTED 12/27/2018 1023    Alcohol Level     Component Value Date/Time   ETH <10  12/27/2018 0943    IMAGING  Mr Jodene Nam Head Wo Contrast 12/28/2018 1. Revascularization of the Right MCA branches since the CTA yesterday. No new or recurrent occlusion identified. 2. Extensive bilateral ICA siphon atherosclerosis with stable mild to moderate stenosis greater on the left. 3. 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   Mr Brain Wo Contrast 12/28/2018 1. Confluent infarct tracking from the superior right temporal gyrus posteriorly through the lateral right occipital lobe. Additional infarct in the right insula, scattered in the right frontal lobe. 2. Cytotoxic edema and Heidelberg Classification 1A petechial blood. No intracranial mass effect. 3. Punctate diffusion abnormality also in the contralateral left parietal lobe. Chronic small cerebellar infarcts.   Cerebral Angio 12/27/2018 1116 RT common carotid arteriogram followed by partial revacularization of occluded RT MCA dominant inf division with x 2 passes with solitaire 61mm x 40 mm x retiever device achieving a TICI 2b revascularization  CT head 12/27/2018 1116  1. Abnormal hypodensity within the right insula consistent with acute infarction change. ASPECTS 9. 2. Generalized parenchymal atrophy with chronic small vessel ischemic disease. Chronic infarcts within the bilateral cerebellum. CTA neck: Common carotid, internal carotid and vertebral arteries patent within the neck bilaterally without significant stenosis.   Ct Angio Head W Or Wo Contrast 12/27/2018 1116 1. 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 Some reconstitution of flow is seen more distally within this vessel. 2. Additional intracranial atherosclerotic stenoses, most notably as follows. Mild/moderate focal stenosis within the intracranial left vertebral artery. Moderate focal stenosis within the P2 right posterior cerebral artery. 3. 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   Ct Angio Neck W Or  Wo Contrast 12/27/2018 1116 Common carotid, internal carotid and vertebral arteries patent within the neck bilaterally without significant stenosis.  Ct Cerebral Perfusion W Contrast 12/27/2018 1116 No core infarct is identified by the perfusion software. However, definite acute infarction changes are demonstrated within the right insula on concurrent noncontrast head CT. ASPECTS 9. 60 mL region of hypoperfused parenchyma detected predominantly within the right MCA vascular territory. Reported mismatch 60 mL.   Ct Head Wo Contrast 12/27/2018 1000 Atrophy with periventricular small vessel disease. Prior small infarct in the superior right cerebellum. No acute infarct evident. No mass or hemorrhage. There are foci of arterial vascular calcification. There is mucosal thickening in several ethmoid air cells.   Dg Chest 1 View 12/29/2018 Interval extubation. No interval change in the appearance of the lungs since the prior radiograph.   12/27/2018 1130 No active disease.   Portable Chest X-ray 12/27/2018 1718 Endotracheal tube well positioned 3 cm above the carina. Cardiomegaly. Previous CABG. No active process evident otherwise.   Dg Hip Unilat W Or Wo Pelvis 2-3 Views Left 12/27/2018 1130 Negative.    PHYSICAL EXAM   Temp:  [97.5 F (36.4 C)-98.5 F (36.9 C)] 97.8 F (36.6 C) (11/20 1100) Pulse Rate:  [47-94] 64 (11/20 1100) Resp:  [18-20] 18 (11/20 1100) BP: (116-167)/(46-77) 123/65 (11/20 1100) SpO2:  [97 %-98 %] 97 % (11/20 0800)  General - Well nourished, well developed, not in acute distress  Ophthalmologic - fundi not visualized due to noncooperation.  Cardiovascular - regular reate and rhythm, not in afib   Neuro - awake alert, orientated to self, age and place but not to situation, able to repeat simple sentences, but mild dysarthria, no aphasia but paucity of speech, naming 2/3, following most simple commands. Hard of hearing. Eyes bilateral horizontal movement,  blinking to visual threat bilaterally. Facial symmetrical. RUE 5/5 and purposeful movement, LUE 3/5 without drift today. LLE 4/5 and RLE 4+/5. Sensation light touch symmetrical subjectively. Coordination not cooperative and gait not tested.    ASSESSMENT/PLAN [name] is a 85 y.o. female with history of HTN, HLD CAD found down by son presenting with L sided weakness, neglect and anosognosia. To IR for R M2 occlusion w/ TICI2b revascularization.  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 Old superior R cerebellar infarct.  CT head hypodensity R insula. Small vessel disease. Atrophy. Old bilateral cerebellar infarcts. ASPECTS 9    CTA head proximal R M2 occlusion. Additional intracranial atherosclerosis (mild/mod L VA, moderate R P2). 67mm paraclinoid L ICA aneurysm  CTA neck Unremarkable   CT perfusion no core infarct. R insular penumbra  w/ 60 mL mismatch  Cerebral angio showed right M2 occlusion s/p TICI2b revascularization  MRI  right large MCA infarct with punctate left ACA infarct  MRA  Right MCA patent now   2D Echo EF 60-65%. No source of embolus   LDL 118  HgbA1c 6.1  eliquis for VTE prophylaxis  aspirin 81 mg daily prior to admission, now on aspirin 325 mg daily. Switch to eliquis today.   Therapy recommendations:  CIR->SNF (rehab admission denied by insurance)-> family prefers home  Home PT, OT, SLP, RN, NA ordered alone with 3N1, w/c w/ cushion and hospital bed on Friday  Disposition:  D/c home once above delivered to home  Acute Respiratory Failure  Intubated for IR  Left intubated post IR as COVID pending   Covid resulted negative  Extubated 11/17  CCM signed off  Atrial Fibrillation w/ ectopy, new onset  Home anticoagulation:  none   CHA2DS2-VASc Score = 7, ?2 oral anticoagulation recommended  Age in Years:  ?74   +2    Sex:  Female    Female   +1    Hypertension History:  yes   +1     Diabetes Mellitus:  0  Congestive Heart Failure History:  0  Vascular Disease History:  yes   +1     Stroke/TIA/Thromboembolism History:  yes   +2  Will start eliquis today    Hypotension Hx Hypertension  Home meds:  Metoprolol 25, triamterene-HCTZ 37.5-25  Stable BP  Treated with cleviprex, now off  Resume metoprolol 25mg  bid BP goal < 180 . Long-term BP goal normotensive  Hyperlipidemia  Home meds:  No statin  LDL 118, goal < 70  Now on lipitor 20 given advanced age  Continue statin at discharge  Dysphagia . Secondary to stroke . On D2 thin liquids . Speech on board . On IVF @ 30  Encourage po intake   UTI with leukocystosis  Leukocytosis. WBC 12.2->13.7->8.3->8.3->8.2   UA w/ trace leukocytoes, few bacteria, WBC 21-50.   UCx E Coli  Treated with Rocephin and 1 dose fosofomycin  Urinary retention   S/p several I/Os  Has foley now  Remove foley today. Voiding trials. If has to be replaced, [facility] RN can follow up. Arrangements in place.   Other Stroke Risk Factors  Advanced age  Coronary artery disease s/p CABG  Other Active Problems  Adenocarcinoma of the hepatic flexure  Hypokalemia K 3.3 - supplemented - 3.9-4.1   GERD on PPI  AKI. Cre 1.25->1.4->1.32->1.29->1.22. continue gentle hydration on IVF @30 , encourage [facility] day # 4  Rosalin Hawking, MD PhD Stroke Neurology 12/31/2018 12:55 PM   To contact Stroke Continuity provider, please refer to [email or web address] After hours, contact General Neurology

## 2018-12-31 NOTE — Progress Notes (Signed)
Occupational Therapy Treatment Patient Details Name: Alexis Haas MRN: [id number] DOB: July 12, 1917 Today's Date: 12/31/2018    History of present illness Pt is a 13 yoF originally [facility] with left sided weakness and AMS found to have acute right M2 occlusion. LSW 11/15 around 1800.  Transferred to [facility] for endovascular repair with partial revascularization of R MCA. PMHx: CAD s/p CABG 2005, HTN, HLD, anemia, CKD, arthritis.   OT comments  Pt making slow progress toward stated goals. This date was a total A +2 for bed mobility and squat pivot transfer. Pt not able to achieve full standing this date despite support of total of 2. Transfer was performed to L side of environment, making it difficult for pt to follow commands considering inattention present. She continues to present with decreased orientation, awareness, attention, and safety. Pt was not able to identify items or count therapist fingers in L side of environment this date. Pt needing multimodal step by step cues to remain engaged in simple tasks. Given current status, recommend SNF level of rehab at d/c. Will continue to follow.   Follow Up Recommendations  SNF;Supervision/Assistance - 24 hour    Equipment Recommendations  None recommended by OT    Recommendations for Other Services      Precautions / Restrictions Precautions Precautions: Fall Precaution Comments: L inattention Restrictions Weight Bearing Restrictions: No       Mobility Bed Mobility Overal bed mobility: Needs Assistance Bed Mobility: Supine to Sit   Sidelying to sit: +2 for physical assistance;+2 for safety/equipment;Total assist       General bed mobility comments: total A +2, helicoptor style with bed pads due to pt difficulty initiating t/f on L side of environment  Transfers Overall transfer level: Needs assistance Equipment used: None;2 person hand held assist Transfers: Sit to/from [employer] Sit to Stand:  Total assist;+2 physical assistance;+2 safety/equipment   Squat pivot transfers: Total assist;+2 physical assistance;+2 safety/equipment     General transfer comment: total A +2 sor initial sit <> stand. Pt feet extended out in front of her despite knee and foot block, not able to achieve full standing. Squat pivot compelted in anterior/posterior fashion +2 with heavy use of bed pads. No initiation from pt noted    Balance Overall balance assessment: Needs assistance Sitting-balance support: Bilateral upper extremity supported Sitting balance-Leahy Scale: Poor Sitting balance - Comments: heavy posterior lean throughout most of sitting EOB, able to maintain balance when given task to engage in anteriorly   Standing balance support: Bilateral upper extremity supported Standing balance-Leahy Scale: Zero Standing balance comment: unable to achieve full standing despite total A +2 support                           ADL either performed or assessed with clinical judgement   ADL Overall ADL's : Needs assistance/impaired                         Toilet Transfer: Total assistance;+2 for physical assistance;+2 for safety/equipment;Squat-pivot;Cueing for sequencing;Cueing for safety Toilet Transfer Details (indicate cue type and reason): unable to stand upright         Functional mobility during ADLs: Total assistance;+2 for physical assistance;+2 for safety/equipment;Cueing for safety;Cueing for sequencing General ADL Comments: pt continues to have limitations in cognition, L sided inattention/deficits, and decreased activity tolerance     Vision Baseline Vision/History: No visual deficits;Wears glasses Wears Glasses: At all  times Patient Visual Report: No change from baseline Vision Assessment?: Yes Tracking/Visual Pursuits: Decreased smoothness of eye movement to LEFT superior field;Decreased smoothness of eye movement to LEFT inferior field Visual Fields: Left  visual field deficit Additional Comments: pt presents with L inattention. Unable to name objects or count therapist fingers on L side   Perception     Praxis      Cognition Arousal/Alertness: Awake/alert Behavior During Therapy: WFL for tasks assessed/performed Overall Cognitive Status: Impaired/Different from baseline Area of Impairment: Orientation;Attention;Memory;Following commands;Safety/judgement;Awareness;Problem solving                 Orientation Level: Disoriented to;Time;Situation;Place Current Attention Level: Focused Memory: Decreased short-term memory Following Commands: Follows one step commands inconsistently;Follows one step commands with increased time Safety/Judgement: Decreased awareness of deficits;Decreased awareness of safety Awareness: Intellectual Problem Solving: Slow processing;Decreased initiation;Difficulty sequencing;Requires verbal cues;Requires tactile cues General Comments: pt unaware that she had a stroke; will stay on same topic for several minutes even though new topic has been initiated. When asking her new question she will respond with the answer to the previous question or perseverate on it. Pt has poor attention and needs consistent step by step multimodal cues to complete mobility and transfers successfully        Exercises     Shoulder Instructions       General Comments      Pertinent Vitals/ Pain       Pain Assessment: Faces Faces Pain Scale: Hurts a little bit Pain Location: knees, generalized (difficulty specifying) Pain Descriptors / Indicators: Aching;Sore Pain Intervention(s): Limited activity within patient's tolerance;Monitored during session  Home Living                                          Prior Functioning/Environment              Frequency  Min 2X/week        Progress Toward Goals  OT Goals(current goals can now be found in the care plan section)  Progress towards OT goals:  Progressing toward goals  Acute Rehab OT Goals Patient Stated Goal: to take a nap OT Goal Formulation: With patient Time For Goal Achievement: 01/12/19 Potential to Achieve Goals: Good  Plan Discharge plan needs to be updated;Frequency remains appropriate    Co-evaluation    PT/OT/SLP Co-Evaluation/Treatment: Yes Reason for Co-Treatment: Complexity of the patient's impairments (multi-system involvement) PT goals addressed during session: Mobility/safety with mobility OT goals addressed during session: ADL's and self-care      AM-PAC OT "6 Clicks" Daily Activity     Outcome Measure   Help from another person eating meals?: A Lot Help from another person taking care of personal grooming?: A Lot Help from another person toileting, which includes using toliet, bedpan, or urinal?: Total Help from another person bathing (including washing, rinsing, drying)?: Total Help from another person to put on and taking off regular upper body clothing?: A Lot Help from another person to put on and taking off regular lower body clothing?: Total 6 Click Score: 9    End of Session    OT Visit Diagnosis: Unsteadiness on feet (R26.81);Muscle weakness (generalized) (M62.81);Other symptoms and signs involving cognitive function;Other abnormalities of gait and mobility (R26.89)   Activity Tolerance Patient tolerated treatment well   Patient Left in chair;with call bell/phone within reach;with chair alarm set;Other (comment)(posey belt)   Nurse Communication  Mobility status        Time: [id number] OT Time Calculation (min): 35 min  Charges: OT General Charges $OT Visit: 1 Visit OT Treatments $Self Care/Home Management : 8-22 mins  Zenovia Jarred, MSOT, OTR/L [place] OT/ Acute Relief OT [facility] Office: [phone]   Zenovia Jarred 12/31/2018, 2:13 PM

## 2018-12-31 NOTE — Progress Notes (Addendum)
Length of Need Lifetime   The above medical condition requires: Patient requires the ability to reposition frequently   Bed type Semi-electric   The frequent position changes cannot be achieved with a normal bed.   Patient suffers from stroke which impairs their ability to perform daily activities like bathing, dressing, feeding and toileting in the home. A cane or walker will not resolve  issue with performing activities of daily living. A wheelchair will allow patient to safely perform daily activities. Patient is not able to propel themselves in the home using a standard weight wheelchair due to arm weakness and endurance. Patient can self propel in the lightweight wheelchair. Length of need Lifetime.  Accessories: elevating leg rests (ELRs), wheel locks, extensions and anti-tippers.  Diagnosis: [place]: 163.9

## 2019-01-01 LAB — GLUCOSE, CAPILLARY
Glucose-Capillary: 106 mg/dL — ABNORMAL HIGH (ref 70–99)
Glucose-Capillary: 92 mg/dL (ref 70–99)
Glucose-Capillary: 102 mg/dL — ABNORMAL HIGH (ref 70–99)
Glucose-Capillary: 123 mg/dL — ABNORMAL HIGH (ref 70–99)

## 2019-01-01 LAB — BASIC METABOLIC PANEL
Anion gap: 9 (ref 5–15)
BUN: 22 mg/dL (ref 8–23)
CO2: 22 mmol/L (ref 22–32)
Calcium: 8.2 mg/dL — ABNORMAL LOW (ref 8.9–10.3)
Chloride: 105 mmol/L (ref 98–111)
Creatinine, Ser: 1.14 mg/dL — ABNORMAL HIGH (ref 0.44–1.00)
GFR calc Af Amer: 46 mL/min — ABNORMAL LOW (ref >60)
GFR calc non Af Amer: 40 mL/min — ABNORMAL LOW (ref >60)
Glucose, Bld: 120 mg/dL — ABNORMAL HIGH (ref 70–99)
Potassium: 4.1 mmol/L (ref 3.5–5.1)
Sodium: 136 mmol/L (ref 135–145)

## 2019-01-01 LAB — CBC
HCT: 34.9 % — ABNORMAL LOW (ref 36.0–46.0)
Hemoglobin: 11.6 g/dL — ABNORMAL LOW (ref 12.0–15.0)
MCH: 29.7 pg (ref 26.0–34.0)
MCHC: 33.2 g/dL (ref 30.0–36.0)
MCV: 89.5 fL (ref 80.0–100.0)
Platelets: 184 10*3/uL (ref 150–400)
RBC: 3.9 MIL/uL (ref 3.87–5.11)
RDW: 13.8 % (ref 11.5–15.5)
WBC: 5.4 10*3/uL (ref 4.0–10.5)
nRBC: 0 % (ref 0.0–0.2)

## 2019-01-01 MED ORDER — AMLODIPINE BESYLATE 5 MG PO TABS
5.0000 mg | ORAL_TABLET | Freq: Every day | ORAL | Status: DC
  Administered 2019-01-01 – 2019-01-03 (×3): 5 mg via ORAL
  Filled 2019-01-01 (×3): qty 1

## 2019-01-01 MED ORDER — QUETIAPINE FUMARATE 25 MG PO TABS
12.5000 mg | ORAL_TABLET | Freq: Every day | ORAL | Status: DC
  Administered 2019-01-01 – 2019-01-02 (×2): 12.5 mg via ORAL
  Filled 2019-01-01 (×2): qty 1

## 2019-01-01 NOTE — Progress Notes (Signed)
STROKE TEAM PROGRESS NOTE   INTERVAL HISTORY (subjective) Patient is doing well, very talkative, no complaints. Still has left arm weakness.   Vitals:   12/31/18 1545 12/31/18 2027 12/31/18 2029 01/01/19 0030  BP: (!) 145/69 (!) 174/71 (!) 158/74 (!) 172/72  Pulse: 78 94 93 63  Resp: 20 16  16   Temp: 97.8 F (36.6 C) 98 F (36.7 C)  97.8 F (36.6 C)  TempSrc: Oral Oral  Oral  SpO2: 97% 99% 98% 97%    CBC:  Recent Labs  Lab 12/27/18 0943  12/28/18 0354  12/31/18 0244 01/01/19 0338  WBC 12.2*  --  13.7*   < > 8.2 5.4  NEUTROABS 10.5*  --  11.8*  --   --   --   HGB 15.6*   < > 13.2   < > 11.3* 11.6*  HCT 45.2   < > 38.8   < > 33.0* 34.9*  MCV 85.0  --  89.2   < > 88.5 89.5  PLT 211  --  203   < > 159 184   < > = values in this interval not displayed.    Basic Metabolic Panel:  Recent Labs  Lab 12/28/18 0354 12/29/18 0439  12/31/18 0244 01/01/19 0338  NA 137 139   < > 137 136  K 3.7 3.3*   < > 4.1 4.1  CL 103 107   < > 108 105  CO2 18* 21*   < > 18* 22  GLUCOSE 169* 90   < > 134* 120*  BUN 21 26*   < > 23 22  CREATININE 1.40* 1.32*   < > 1.22* 1.14*  CALCIUM 7.9* 7.9*   < > 8.1* 8.2*  MG 1.7 2.2  --   --   --    < > = values in this interval not displayed.   Lipid Panel:     Component Value Date/Time   CHOL 186 12/28/2018 0354   TRIG 120 12/28/2018 0354   HDL 44 12/28/2018 0354   CHOLHDL 4.2 12/28/2018 0354   VLDL 24 12/28/2018 0354   LDLCALC 118 (H) 12/28/2018 0354   HgbA1c:  Lab Results  Component Value Date   HGBA1C 6.1 (H) 12/28/2018   Urine Drug Screen:     Component Value Date/Time   LABOPIA NONE DETECTED 12/27/2018 1023   COCAINSCRNUR NONE DETECTED 12/27/2018 1023   LABBENZ NONE DETECTED 12/27/2018 1023   AMPHETMU NONE DETECTED 12/27/2018 1023   THCU NONE DETECTED 12/27/2018 1023   LABBARB NONE DETECTED 12/27/2018 1023    Alcohol Level     Component Value Date/Time   ETH <10 12/27/2018 0943    IMAGING  Mr Jodene Nam Head Wo  Contrast 12/28/2018 1. Revascularization of the Right MCA branches since the CTA yesterday. No new or recurrent occlusion identified. 2. Extensive bilateral ICA siphon atherosclerosis with stable mild to moderate stenosis greater on the left. 3. 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   Mr Brain Wo Contrast 12/28/2018 1. Confluent infarct tracking from the superior right temporal gyrus posteriorly through the lateral right occipital lobe. Additional infarct in the right insula, scattered in the right frontal lobe. 2. Cytotoxic edema and Heidelberg Classification 1A petechial blood. No intracranial mass effect. 3. Punctate diffusion abnormality also in the contralateral left parietal lobe. Chronic small cerebellar infarcts.   Cerebral Angio 12/27/2018 1116 RT common carotid arteriogram followed by partial revacularization of occluded RT MCA dominant inf division with x 2 passes with  solitaire 29mm x 40 mm x retiever device achieving a TICI 2b revascularization  CT head 12/27/2018 1116 1. Abnormal hypodensity within the right insula consistent with acute infarction change. ASPECTS 9. 2. Generalized parenchymal atrophy with chronic small vessel ischemic disease. Chronic infarcts within the bilateral cerebellum. CTA neck: Common carotid, internal carotid and vertebral arteries patent within the neck bilaterally without significant stenosis.   Ct Angio Head W Or Wo Contrast 12/27/2018 1116 1. 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 Some reconstitution of flow is seen more distally within this vessel. 2. Additional intracranial atherosclerotic stenoses, most notably as follows. Mild/moderate focal stenosis within the intracranial left vertebral artery. Moderate focal stenosis within the P2 right posterior cerebral artery. 3. 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   Ct Angio Neck W Or Wo Contrast 12/27/2018 1116 Common carotid,  internal carotid and vertebral arteries patent within the neck bilaterally without significant stenosis.  Ct Cerebral Perfusion W Contrast 12/27/2018 1116 No core infarct is identified by the perfusion software. However, definite acute infarction changes are demonstrated within the right insula on concurrent noncontrast head CT. ASPECTS 9. 60 mL region of hypoperfused parenchyma detected predominantly within the right MCA vascular territory. Reported mismatch 60 mL.   Ct Head Wo Contrast 12/27/2018 1000 Atrophy with periventricular small vessel disease. Prior small infarct in the superior right cerebellum. No acute infarct evident. No mass or hemorrhage. There are foci of arterial vascular calcification. There is mucosal thickening in several ethmoid air cells.   Dg Chest 1 View 12/29/2018 Interval extubation. No interval change in the appearance of the lungs since the prior radiograph.   12/27/2018 1130 No active disease.   Portable Chest X-ray 12/27/2018 1718 Endotracheal tube well positioned 3 cm above the carina. Cardiomegaly. Previous CABG. No active process evident otherwise.   Dg Hip Unilat W Or Wo Pelvis 2-3 Views Left 12/27/2018 1130 Negative.    PHYSICAL EXAM   Temp:  [97.8 F (36.6 C)-98.3 F (36.8 C)] 97.8 F (36.6 C) (11/21 0030) Pulse Rate:  [63-94] 63 (11/21 0030) Resp:  [16-20] 16 (11/21 0030) BP: (123-174)/(65-77) 172/72 (11/21 0030) SpO2:  [97 %-99 %] 97 % (11/21 0030)  General - Well nourished, well developed, not in acute distress  Ophthalmologic - fundi not visualized due to noncooperation.  Cardiovascular - regular reate and rhythm, not in afib   Neuro - awake alert, orientated to self, age and place but not to situation, able to repeat simple sentences, but mild dysarthria, no aphasia but paucity of speech, naming 2/3, following most simple commands. Hard of hearing. Eyes bilateral horizontal movement, blinking to visual threat bilaterally. Facial  symmetrical. RUE 5/5 and purposeful movement, LUE 3/5 without drift . LLE 4/5 and RLE 4+/5. Sensation light touch symmetrical subjectively. Coordination not cooperative and gait not tested.    ASSESSMENT/PLAN [name] is a 85 y.o. female with history of HTN, HLD CAD found down by son presenting with L sided weakness, neglect and anosognosia. To IR for R M2 occlusion w/ TICI2b revascularization.  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 Old superior R cerebellar infarct.  CT head hypodensity R insula. Small vessel disease. Atrophy. Old bilateral cerebellar infarcts. ASPECTS 9    CTA head proximal R M2 occlusion. Additional intracranial atherosclerosis (mild/mod L VA, moderate R P2). 34mm  paraclinoid L ICA aneurysm  CTA neck Unremarkable   CT perfusion no core infarct. R insular penumbra w/ 60 mL mismatch  Cerebral angio showed right M2 occlusion s/p TICI2b revascularization  MRI  right large MCA infarct with punctate left ACA infarct  MRA  Right MCA patent now   2D Echo EF 60-65%. No source of embolus   LDL 118  HgbA1c 6.1  eliquis for VTE prophylaxis  aspirin 81 mg daily prior to admission, now on aspirin 325 mg daily. Switched to eliquis Friday 12/31/18  Therapy recommendations:  CIR->SNF (rehab admission denied by insurance)-> family prefers home  Home PT, OT, SLP, RN, NA ordered alone with 3N1, w/c w/ cushion and hospital bed on Friday  Disposition:  D/c home once above delivered to home  Acute Respiratory Failure  Intubated for IR  Left intubated post IR as COVID pending   Covid resulted negative  Extubated 11/17  CCM signed off  Atrial Fibrillation w/ ectopy, new onset  Home anticoagulation:  none   CHA2DS2-VASc Score = 7, ?2 oral anticoagulation recommended  Age in Years:  ?48   +2    Sex:  Female   Female   +1    Hypertension History:   yes   +1     Diabetes Mellitus:  0  Congestive Heart Failure History:  0  Vascular Disease History:  yes   +1     Stroke/TIA/Thromboembolism History:  yes   +2  Eliquis started Friday 12/31/18   Hypotension Hx Hypertension  Home meds:  Metoprolol 25, triamterene-HCTZ 37.5-25  Stable BP  Treated with cleviprex, now off  Resume metoprolol 25mg  bid BP goal < 180 . Long-term BP goal normotensive  Hyperlipidemia  Home meds:  No statin  LDL 118, goal < 70  Now on lipitor 20 given advanced age  Continue statin at discharge  Dysphagia . Secondary to stroke . On D2 thin liquids . Speech on board . On IVF @ 30  Encourage po intake   UTI with leukocystosis  Leukocytosis. WBC 12.2->13.7->8.3->8.3->8.2->5.4  UA w/ trace leukocytoes, few bacteria, WBC 21-50.   UCx E Coli  Treated with Rocephin and 1 dose fosofomycin  Urinary retention   S/p several I/Os  Has foley now  Remove foley today. Voiding trials. If has to be replaced, [facility] RN can follow up. Arrangements in place.   Other Stroke Risk Factors  Advanced age  Coronary artery disease s/p CABG  Other Active Problems  Adenocarcinoma of the hepatic flexure  Hypokalemia K 3.3 - supplemented - 3.9-4.1->4.1  GERD on PPI  AKI. Cre 1.25->1.4->1.32->1.29->1.22 ->1.14 continue gentle hydration on IVF @30 , encourage POs  PLAN   Will contact Case Manager or Social Worker regarding discharge timing.  Hospital day # 5     To contact Stroke Continuity provider, please refer to [email or web address] After hours, contact General Neurology

## 2019-01-01 NOTE — Care Management (Cosign Needed)
    Durable Medical Equipment  (From admission, onward)         Start     Ordered   12/31/18 1633  For home use only DME wheelchair cushion (seat and back)  Once     12/31/18 1632   12/31/18 1629  For home use only DME Hospital bed  Once    Question Answer Comment  Length of Need Lifetime   The above medical condition requires: Patient requires the ability to reposition frequently   Bed type Semi-electric      12/31/18 1629   12/31/18 1629  For home use only DME lightweight manual wheelchair with seat cushion  Once    Comments: Patient suffers from stroke which impairs their ability to perform daily activities like bathing, dressing, feeding and toileting in the home.  A cane or walker will not resolve  issue with performing activities of daily living. A wheelchair will allow patient to safely perform daily activities. Patient is not able to propel themselves in the home using a standard weight wheelchair due to arm weakness and endurance. Patient can self propel in the lightweight wheelchair. Length of need Lifetime. Accessories: elevating leg rests (ELRs), wheel locks, extensions and anti-tippers.   12/31/18 1629   12/31/18 1628  For home use only DME 3 n 1  Once     12/31/18 1629

## 2019-01-01 NOTE — Discharge Summary (Addendum)
Patient ID: Alexis Haas   MRN: [id number]      DOB: 1918-02-04  Date of Admission: 12/27/2018 Date of Discharge: 01/03/2019  Attending Physician:  Garvin Fila, MD, Stroke MD Consultant(s):    Wyman Songster, MD (Interventional Neuroradiologist); Candee Furbish, MD (Critical Care); Izora Ribas, MD (Physical Medicine and Rehabilitation);  Patient's PCP:  Lynnell Jude, MD  DISCHARGE DIAGNOSIS:  Principal Problem:   Stroke (cerebrum) (HCC):Right MCA stroke treated with thrombectomy secondary to atrial fibrillation Active Problems:  Middle cerebral artery embolism, right -> thrombectomy  Atrial Fibrillation  Anticoagulation with Eliquis  Chronic Kidney Disease  Anemia  Dyslipidemia   Hypertension  UTI with leukocytosis  Urinary retention  Hypokalemia  Mechanical ventilation  Past Medical History:  Diagnosis Date  . Anemia   . Arthritis   . Cancer ([place]) 04/15/2016   T3, N1a adenocarcinoma of the hepatic flexure. No mismatch repair gene abnormality.   . Chronic kidney disease    LABS 3/18 COMPARED WITH 1 YEAR AGO  . Coronary artery disease   . Fall 2018  . GERD (gastroesophageal reflux disease)   . Heart disease   . Hypercholesteremia   . Hypertension   . Insomnia    Past Surgical History:  Procedure Laterality Date  . CARDIAC SURGERY  2005   bypass  . COLONOSCOPY WITH PROPOFOL N/A 04/15/2016   Procedure: COLONOSCOPY WITH PROPOFOL;  Surgeon: Lucilla Lame, MD;  Location: [facility] END[facility]COPY;  Service: Endoscopy;  Laterality: N/A;  . CORONARY ARTERY BYPASS GRAFT  2005   x 2  . ESOPHAGOGASTRODUODENOSCOPY (EGD) WITH PROPOFOL N/A 04/15/2016   Procedure: ESOPHAGOGASTRODUODENOSCOPY (EGD) WITH PROPOFOL;  Surgeon: Lucilla Lame, MD;  Location: [facility] ENDOSCOPY;  Service: Endoscopy;  Laterality: N/A;  . IR ANGIO VERTEBRAL SEL SUBCLAVIAN INNOMINATE UNI R MOD SED  12/27/2018  . IR CT HEAD LTD  12/27/2018  . IR PERCUTANEOUS ART THROMBECTOMY/INFUSION  INTRACRANIAL INC DIAG ANGIO  12/27/2018  . LAPAROSCOPIC RIGHT COLECTOMY Right 06/05/2016   Procedure: LAPAROSCOPIC RIGHT COLECTOMY;  Surgeon: Robert Bellow, MD;  Location: [facility] ORS;  Service: General;  Laterality: Right;  . RADIOLOGY WITH ANESTHESIA N/A 12/27/2018   Procedure: CODE STROKE;  Surgeon: Radiologist, Medication, MD;  Location: [place];  Service: Radiology;  Laterality: N/A;  . toe removal    . TONSILLECTOMY     age 75  . UMBILICAL HERNIA REPAIR  06/05/2016   Procedure: HERNIA REPAIR UMBILICAL ADULT;  Surgeon: Robert Bellow, MD;  Location: [facility] ORS;  Service: General;;   Family History No family history on file.  Social History  reports that she has never smoked. She has never used smokeless tobacco. She reports that she does not drink alcohol or use drugs.  Allergies as of 01/03/2019      Reactions   Amoxicillin Rash   Has patient had a PCN reaction causing immediate rash, facial/tongue/throat swelling, SOB or lightheadedness with hypotension:Yes Has patient had a PCN reaction causing severe rash involving mucus membranes or skin necrosis:unsure Has patient had a PCN reaction that required hospitalization:No Has patient had a PCN reaction occurring within the last 10 years:No If all of the above answers are "NO", then may proceed with Cephalosporin use.      Medication List    STOP taking these medications   aspirin EC 81 MG tablet   naproxen sodium 220 MG tablet Commonly known as: ALEVE   triamterene-hydrochlorothiazide 37.5-25 MG tablet Commonly known as: [employer]  TAKE these medications   acetaminophen 500 MG tablet Commonly known as: TYLENOL Take 500 mg by mouth daily.   amLODipine 5 MG tablet Commonly known as: NORVASC Take 1 tablet (5 mg total) by mouth daily. Start taking on: January 04, 2019   apixaban 5 MG Tabs tablet Commonly known as: ELIQUIS Take 1 tablet (5 mg total) by mouth 2 (two) times daily.   atorvastatin 20 MG  tablet Commonly known as: LIPITOR Take 1 tablet (20 mg total) by mouth daily at 6 PM.   ferrous sulfate 325 (65 FE) MG tablet Take 325 mg by mouth daily with breakfast.   metoprolol tartrate 25 MG tablet Commonly known as: LOPRESSOR Take 1 tablet (25 mg total) by mouth 2 (two) times daily. What changed: when to take this   omeprazole 20 MG capsule Commonly known as: PRILOSEC Take 20 mg by mouth at bedtime.   QUEtiapine 25 MG tablet Commonly known as: SEROQUEL Take 0.5 tablets (12.5 mg total) by mouth at bedtime.            Durable Medical Equipment  (From admission, onward)         Start     Ordered   01/01/19 1411  For home use only DME Hospital bed  Once    Question Answer Comment  Length of Need Lifetime   Patient has (list medical condition): weakness, inability to move herself, bed bound   The above medical condition requires: Patient requires the ability to reposition frequently   Head must be elevated greater than: 30 degrees   Bed type Semi-electric   Hoyer Lift Yes   Support Surface: Gel Overlay      01/01/19 1411   12/31/18 1633  For home use only DME wheelchair cushion (seat and back)  Once     12/31/18 1632   12/31/18 1629  For home use only DME lightweight manual wheelchair with seat cushion  Once    Comments: Patient suffers from stroke which impairs their ability to perform daily activities like bathing, dressing, feeding and toileting in the home.  A cane or walker will not resolve  issue with performing activities of daily living. A wheelchair will allow patient to safely perform daily activities. Patient is not able to propel themselves in the home using a standard weight wheelchair due to arm weakness and endurance. Patient can self propel in the lightweight wheelchair. Length of need Lifetime. Accessories: elevating leg rests (ELRs), wheel locks, extensions and anti-tippers.   12/31/18 1629   12/31/18 1628  For home use only DME 3 n 1  Once      12/31/18 1629          HOME MEDICATIONS PRIOR TO ADMISSION Medications Prior to Admission  Medication Sig Dispense Refill  . acetaminophen (TYLENOL) 500 MG tablet Take 500 mg by mouth daily.    Marland Kitchen aspirin EC 81 MG tablet Take 81 mg by mouth every 4 (four) hours as needed.     . ferrous sulfate 325 (65 FE) MG tablet Take 325 mg by mouth daily with breakfast.    . metoprolol tartrate (LOPRESSOR) 25 MG tablet Take 25 mg by mouth daily.     . naproxen sodium (ANAPROX) 220 MG tablet Take 220 mg by mouth daily as needed (for pain).     Marland Kitchen omeprazole (PRILOSEC) 20 MG capsule Take 20 mg by mouth at bedtime.     . triamterene-hydrochlorothiazide (MAXZIDE-25) 37.5-25 MG tablet Take 1 tablet by mouth daily.  HOSPITAL MEDICATIONS .  stroke: mapping our early stages of recovery book   Does not apply Once  . amLODipine  5 mg Oral Daily  . apixaban  5 mg Oral BID  . atorvastatin  20 mg Oral q1800  . chlorhexidine gluconate (MEDLINE KIT)  15 mL Mouth Rinse BID  . Chlorhexidine Gluconate Cloth  6 each Topical Daily  . ferrous sulfate  325 mg Oral Q breakfast  . insulin aspart  0-6 Units Subcutaneous TID WC  . metoprolol tartrate  25 mg Oral BID  . pantoprazole  40 mg Oral Daily  . QUEtiapine  12.5 mg Oral QHS    LABORATORY STUDIES CBC    Component Value Date/Time   WBC 6.3 01/02/2019 0254   RBC 4.19 01/02/2019 0254   HGB 12.5 01/02/2019 0254   HCT 37.4 01/02/2019 0254   PLT 216 01/02/2019 0254   MCV 89.3 01/02/2019 0254   MCH 29.8 01/02/2019 0254   MCHC 33.4 01/02/2019 0254   RDW 13.6 01/02/2019 0254   LYMPHSABS 0.9 01-21-2019 0354   MONOABS 0.9 01/21/2019 0354   EOSABS 0.0 21-Jan-2019 0354   BASOSABS 0.0 January 21, 2019 0354   CMP    Component Value Date/Time   NA 136 01/01/2019 0338   K 4.1 01/01/2019 0338   CL 105 01/01/2019 0338   CO2 22 01/01/2019 0338   GLUCOSE 120 (H) 01/01/2019 0338   BUN 22 01/01/2019 0338   CREATININE 1.14 (H) 01/01/2019 0338   CALCIUM 8.2 (L)  01/01/2019 0338   PROT 7.6 12/27/2018 0943   ALBUMIN 4.4 12/27/2018 0943   AST 33 12/27/2018 0943   ALT 17 12/27/2018 0943   ALKPHOS 55 12/27/2018 0943   BILITOT 1.0 12/27/2018 0943   GFRNONAA 40 (L) 01/01/2019 0338   GFRAA 46 (L) 01/01/2019 0338   COAGS Lab Results  Component Value Date   INR 0.9 12/27/2018   Lipid Panel    Component Value Date/Time   CHOL 186 2019/01/21 0354   TRIG 120 01-21-19 0354   HDL 44 January 21, 2019 0354   CHOLHDL 4.2 January 21, 2019 0354   VLDL 24 01-21-2019 0354   LDLCALC 118 (H) 01-21-2019 0354   HgbA1C  Lab Results  Component Value Date   HGBA1C 6.1 (H) January 21, 2019   Urinalysis    Component Value Date/Time   COLORURINE YELLOW (A) 12/27/2018 1023   APPEARANCEUR CLOUDY (A) 12/27/2018 1023   LABSPEC 1.014 12/27/2018 1023   PHURINE 7.0 12/27/2018 1023   GLUCOSEU NEGATIVE 12/27/2018 1023   HGBUR NEGATIVE 12/27/2018 1023   [place] 12/27/2018 1023   KETONESUR NEGATIVE 12/27/2018 1023   PROTEINUR 30 (A) 12/27/2018 1023   NITRITE NEGATIVE 12/27/2018 1023   LEUKOCYTESUR TRACE (A) 12/27/2018 1023   Urine Drug Screen     Component Value Date/Time   LABOPIA NONE DETECTED 12/27/2018 1023   COCAINSCRNUR NONE DETECTED 12/27/2018 1023   LABBENZ NONE DETECTED 12/27/2018 1023   AMPHETMU NONE DETECTED 12/27/2018 1023   THCU NONE DETECTED 12/27/2018 1023   LABBARB NONE DETECTED 12/27/2018 1023    Alcohol Level    Component Value Date/Time   [facility] <10 12/27/2018 0943     SIGNIFICANT DIAGNOSTIC STUDIES  Mr Jodene Nam Head Wo Contrast 01/21/2019 1. Revascularization of the Right MCA branches since the CTA yesterday. No new or recurrent occlusion identified. 2. Extensive bilateral ICA siphon atherosclerosis with stable mild to moderate stenosis greater on the left. 3. 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   Mr Brain Wo Contrast January 21, 2019 1.  Confluent infarct tracking from the superior right temporal gyrus posteriorly through the  lateral right occipital lobe. Additional infarct in the right insula, scattered in the right frontal lobe. 2. Cytotoxic edema and Heidelberg Classification 1A petechial blood. No intracranial mass effect. 3. Punctate diffusion abnormality also in the contralateral left parietal lobe. Chronic small cerebellar infarcts.   Cerebral Angio 12/27/2018 1116 RT common carotid arteriogram followed by partial revacularization of occluded RT MCA dominant inf division with x 2 passes with solitaire 53m x 40 mm x retiever device achieving a TICI 2b revascularization  CT head 12/27/2018 1116 1. Abnormal hypodensity within the right insula consistent with acute infarction change. ASPECTS 9. 2. Generalized parenchymal atrophy with chronic small vessel ischemic disease. Chronic infarcts within the bilateral cerebellum. CTA neck: Common carotid, internal carotid and vertebral arteries patent within the neck bilaterally without significant stenosis.   Ct Angio Head W Or Wo Contrast 12/27/2018 1116 1. 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 Some reconstitution of flow is seen more distally within this vessel. 2. Additional intracranial atherosclerotic stenoses, most notably as follows. Mild/moderate focal stenosis within the intracranial left vertebral artery. Moderate focal stenosis within the P2 right posterior cerebral artery. 3. 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   Ct Angio Neck W Or Wo Contrast 12/27/2018 1116 Common carotid, internal carotid and vertebral arteries patent within the neck bilaterally without significant stenosis.  Ct Cerebral Perfusion W Contrast 12/27/2018 1116 No core infarct is identified by the perfusion software. However, definite acute infarction changes are demonstrated within the right insula on concurrent noncontrast head CT. ASPECTS 9. 60 mL region of hypoperfused parenchyma detected predominantly within the  right MCA vascular territory. Reported mismatch 60 mL.   Ct Head Wo Contrast 12/27/2018 1000 Atrophy with periventricular small vessel disease. Prior small infarct in the superior right cerebellum. No acute infarct evident. No mass or hemorrhage. There are foci of arterial vascular calcification. There is mucosal thickening in several ethmoid air cells.   Dg Chest 1 View 12/29/2018 Interval extubation. No interval change in the appearance of the lungs since the prior radiograph.   12/27/2018 1130 No active disease.   Portable Chest X-ray 12/27/2018 1718 Endotracheal tube well positioned 3 cm above the carina. Cardiomegaly. Previous CABG. No active process evident otherwise.   Dg Hip Unilat W Or Wo Pelvis 2-3 Views Left 12/27/2018 1130 Negative.      HISTORY OF PRESENT ILLNESS From Dr LYvetta CoderH&P 12/27/2018) Alexis STEMPELis an 85y.o. [facility]. The patient is unable to provide a history due to anosognosia and left sided neglect, therefore [facility].  [facility] RDoy Mince [facility]: "Alexis LamphearGuthrieis an 85y.o.femalewith a history ofCAD,HTN and HLD(on ASA)who lives at home alone able to provide all ADL's was found on the floor by son this morning. Patient was visited by her son on yesterday. She was at baseline when he left at 1800. This morning found on the floor with left sided weakness. Patient reports that she started to feel bad some time on yesterday. Initial NIHSS of 6." LKW: 1800 on Sunday 12/26/2018. tPA Given: No: Outside of IV tPA time window. CTA showed occlusion R M2 , c/w exam findings of R MCA infarct. CTP with mismatch. [facility]  HOSPITAL COURSE Alexis Haas is a 85 y.o. female with history of HTN, HLD CAD found down by son presenting  with L sided weakness, neglect and anosognosia. To IR for R M2 occlusion w/ TICI2b revascularization.  Stroke:   R MCA infarct due to right M2 occlusion s/p IR w/ TICI2b recanalization - infarct embolic secondary to new onset AF  CT head No acute abnormality. Old superior R cerebellar infarct.  CT head hypodensity R insula. Small vessel disease. Atrophy. Old bilateral cerebellar infarcts. ASPECTS 9    CTA head proximal R M2 occlusion. Additional intracranial atherosclerosis (mild/mod L VA, moderate R P2). 73m paraclinoid L ICA aneurysm  CTA neck Unremarkable   CT perfusion no core infarct. R insular penumbra w/ 60 mL mismatch  Cerebral angio showed right M2 occlusion s/p TICI2b revascularization  MRI  right large MCA infarct with punctate left ACA infarct  MRA  Right MCA patent now  2D Echo EF 60-65%. No source of embolus   LDL 118  HgbA1c 6.1  eliquis for VTE prophylaxis  aspirin 81 mg daily prior to admission, treated with aspirin 325 mg in hospital. Once stable, switched to eliquis on 12/31/18  Therapy recommendations:  CIR->SNF (rehab admission denied by insurance)-> family prefers home  Home PT, OT, SLP, RN, NA ordered alone with 3N1, w/c w/ cushion and hospital bed on Friday  Disposition:  D/c home once DME delivered to home  Acute Respiratory Failure  Intubated for IR  Left intubated post IR as COVID pending   Covid resulted negative  Extubated 11/17  CCM signed off  Atrial Fibrillation w/ ectopy, new onset  Home anticoagulation:  none   CHA2DS2-VASc Score = 7, ?2 oral anticoagulation recommended             Age in Years:  ?89   +2                       Sex:  Female   Female   +1                      Hypertension History:  yes   +1                        Diabetes Mellitus:  0    Congestive Heart Failure History:  0             Vascular Disease History:  yes   +1                            Stroke/TIA/Thromboembolism History:  yes   +2  Eliquis  started Friday 12/31/18   Hypotension, resolved Hx Hypertension  Home meds:  Metoprolol 25, triamterene-HCTZ 37.5-25  Stable BP  Treated with cleviprex post IR, now off  BP goal < 180  Long-term BP goal normotensive  Resumed metoprolol 263mbid  Changed Maxzide to Norvasc as Maxzide was contraindicated based on patient's creatinine clearance.  Hyperlipidemia  Home meds:  No statin  LDL 118, goal < 70  Now on lipitor 20, will not given higher intensity statin given advanced age  Continue statin at discharge  Dysphagia  Secondary to stroke  On D2 thin liquids  Speech on board  Encourage po intake   UTI with leukocystosis  Leukocytosis. WBC 12.2->13.7->8.3->8.3->8.2->5.4->6.3  UA w/ trace leukocytoes, few bacteria, WBC 21-50.   UCx E  Coli, Enterococcus Faecalis  Treated with Rocephin and 1 dose fosofomycin  Urinary retention   S/p several I/Os  Failed weekend Voiding trials.   foley replaced  Center RN can provide follow up care/instruction at home. Arrangements in place.   Other Stroke Risk Factors  Advanced age  Coronary artery disease s/p CABG  Other Active Problems  Adenocarcinoma of the hepatic flexure  Hypokalemia K 3.3 - supplemented - 3.9-4.1->4.1  GERD on PPI  AKI. Cre 1.25->1.4->1.32->1.29->1.22 ->1.14. treated with continue gentle hydration, encourage POs (Changed Maxzide to Norvasc based on pt's creatinine clearance)  DISCHARGE EXAM Vitals:   01/02/19 2351 01/03/19 0333 01/03/19 0734 01/03/19 1143  BP: (!) 150/64 138/80 (!) 135/53 115/61  Pulse: 60 70 68 87  Resp: '16 17 12 12  ' Temp: 97.9 F (36.6 C) 97.9 F (36.6 C) (!) 97.5 F (36.4 C) 97.9 F (36.6 C)  TempSrc: Oral Oral Oral Oral  SpO2: 98% 94% 97% 99%   General- Well nourished, well developed, not in acute distress  Ophthalmologic- fundi not visualized due to noncooperation.  Cardiovascular -regular reate and rhythm, not in afib  Neuro -awake alert,  orientated to self, age and place but not to situation, able to repeat simple sentences, but mild dysarthria, no aphasia but paucity of speech, naming 2/3,following most simple commands. Hard of hearing. Eyes bilateral horizontal movement, blinking to visual threat bilaterally.Facial symmetrical. RUE 5/5 and purposeful movement, LUE3/5without drift . LLE 4/5 and RLE 4+/5. Sensation light touch symmetrical subjectively. Coordination not cooperative and gait not tested.   Discharge Diet   Dysphagia thin liquids  DISCHARGE PLAN  Disposition: Discharged to home with home therapists and RN, NA.  Eliquis (apixaban) daily for secondary stroke prevention.  Ongoing risk factor control by Primary Care Physician at time of discharge  Follow-up Lynnell Jude, MD in 2 weeks.  Follow-up in [facility] in 4 weeks, office to schedule an appointment.   Follow up with Urology as instructed.  Follow up with Dr Estanislado Pandy as instructed.  35 minutes were spent preparing discharge.  Burnetta Sabin, MSN, APRN, ANVP-BC, AGPCNP-BC Advanced Practice Stroke Nurse [place] for Schedule & Pager information 01/03/2019 12:17 PM  I have personally obtained history,examined this patient, reviewed notes, independently viewed imaging studies, participated in medical decision making and plan of care.ROS completed by me personally and pertinent positives fully documented  I have made any additions or clarifications directly to the above note. Agree with note above.    Antony Contras, MD Medical Director [facility] Pager: [phone] 01/03/2019 2:08 PM

## 2019-01-01 NOTE — Plan of Care (Signed)
Nursing reports pt is retaining urine. Foley was removed yesterday. In 24 hours she has had two in and out caths - the last one for 750 ccs. Called urology on call - Dr Junious Silk - to discuss the patient. He recommended sending the pt home with a foley and outpt evaluation with his office. Placed order to insert foley cath.  Mikey Bussing PA-C [facility] Pager [phone] 01/01/2019, 1:48 PM

## 2019-01-01 NOTE — Care Management (Signed)
Spoke w patient's daughter who is stuck in traffic on her way here. She has not received equipment yet. Her spouse at home ready for delivery anytime. [employer] is requesting and has been provided with additional documentation to process order.

## 2019-01-02 LAB — GLUCOSE, CAPILLARY
Glucose-Capillary: 102 mg/dL — ABNORMAL HIGH (ref 70–99)
Glucose-Capillary: 101 mg/dL — ABNORMAL HIGH (ref 70–99)
Glucose-Capillary: 101 mg/dL — ABNORMAL HIGH (ref 70–99)
Glucose-Capillary: 97 mg/dL (ref 70–99)

## 2019-01-02 LAB — CBC
HCT: 37.4 % (ref 36.0–46.0)
Hemoglobin: 12.5 g/dL (ref 12.0–15.0)
MCH: 29.8 pg (ref 26.0–34.0)
MCHC: 33.4 g/dL (ref 30.0–36.0)
MCV: 89.3 fL (ref 80.0–100.0)
Platelets: 216 10*3/uL (ref 150–400)
RBC: 4.19 MIL/uL (ref 3.87–5.11)
RDW: 13.6 % (ref 11.5–15.5)
WBC: 6.3 10*3/uL (ref 4.0–10.5)
nRBC: 0 % (ref 0.0–0.2)

## 2019-01-02 NOTE — Plan of Care (Signed)
  Problem: Health Behavior/Discharge Planning: Goal: Ability to manage health-related needs will improve Outcome: Not Progressing   

## 2019-01-02 NOTE — Progress Notes (Signed)
OT Cancellation Note  Patient Details Name: Alexis Haas MRN: [id number] DOB: [birth year]   Cancelled Treatment:    Reason Eval/Treat Not Completed: Other (comment);Fatigue/lethargy limiting ability to participate   Attempted to see pt for OT treatment. Pt too lethargic to participate in session despite MAX multimodal cues to arouse pt.  RN reports pt received Seroquel overnight. Will check back as time allows.  Lanier Clam., COTA/L Acute Rehabilitation Services [phone] [place] 01/02/2019, 12:06 PM

## 2019-01-02 NOTE — Progress Notes (Signed)
PT Cancellation Note  Patient Details Name: Alexis Haas MRN: [id number] DOB: 1917-03-09   Cancelled Treatment:    Reason Eval/Treat Not Completed: Fatigue/lethargy limiting ability to participate  Attempted to see pt for PT treatment. Pt is too lethargic at this time to participate and resisting attempts to mobilize. RN reports pt received Seroquel overnight so possibly due to medication. PT will continue to follow acutely.    Earney Navy, PTA Acute Rehabilitation Services Pager: [phone] Office: [phone]   01/02/2019, 11:05 AM

## 2019-01-02 NOTE — Progress Notes (Signed)
STROKE TEAM PROGRESS NOTE   INTERVAL HISTORY (subjective) Patient is doing well, sleeping comfortably, easily woken up, denies any complaints  Vitals:   01/01/19 1928 01/02/19 0004 01/02/19 0305 01/02/19 0740  BP: (!) 145/70 137/69 (!) 176/75 129/62  Pulse: 93 90 76 60  Resp: 18 19 18    Temp: 98 F (36.7 C) 97.7 F (36.5 C) 97.6 F (36.4 C) 98.4 F (36.9 C)  TempSrc: Oral Oral Oral Oral  SpO2: 97% 99% 98%     CBC:  Recent Labs  Lab 12/27/18 0943  12/28/18 0354  01/01/19 0338 01/02/19 0254  WBC 12.2*  --  13.7*   < > 5.4 6.3  NEUTROABS 10.5*  --  11.8*  --   --   --   HGB 15.6*   < > 13.2   < > 11.6* 12.5  HCT 45.2   < > 38.8   < > 34.9* 37.4  MCV 85.0  --  89.2   < > 89.5 89.3  PLT 211  --  203   < > 184 216   < > = values in this interval not displayed.    Basic Metabolic Panel:  Recent Labs  Lab 12/28/18 0354 12/29/18 0439  12/31/18 0244 01/01/19 0338  NA 137 139   < > 137 136  K 3.7 3.3*   < > 4.1 4.1  CL 103 107   < > 108 105  CO2 18* 21*   < > 18* 22  GLUCOSE 169* 90   < > 134* 120*  BUN 21 26*   < > 23 22  CREATININE 1.40* 1.32*   < > 1.22* 1.14*  CALCIUM 7.9* 7.9*   < > 8.1* 8.2*  MG 1.7 2.2  --   --   --    < > = values in this interval not displayed.   Lipid Panel:     Component Value Date/Time   CHOL 186 12/28/2018 0354   TRIG 120 12/28/2018 0354   HDL 44 12/28/2018 0354   CHOLHDL 4.2 12/28/2018 0354   VLDL 24 12/28/2018 0354   LDLCALC 118 (H) 12/28/2018 0354   HgbA1c:  Lab Results  Component Value Date   HGBA1C 6.1 (H) 12/28/2018   Urine Drug Screen:     Component Value Date/Time   LABOPIA NONE DETECTED 12/27/2018 1023   COCAINSCRNUR NONE DETECTED 12/27/2018 1023   LABBENZ NONE DETECTED 12/27/2018 1023   AMPHETMU NONE DETECTED 12/27/2018 1023   THCU NONE DETECTED 12/27/2018 1023   LABBARB NONE DETECTED 12/27/2018 1023    Alcohol Level     Component Value Date/Time   ETH <10 12/27/2018 0943    IMAGING  Mr Alexis Haas Head Wo  Contrast 12/28/2018 1. Revascularization of the Right MCA branches since the CTA yesterday. No new or recurrent occlusion identified. 2. Extensive bilateral ICA siphon atherosclerosis with stable mild to moderate stenosis greater on the left. 3. 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   Mr Brain Wo Contrast 12/28/2018 1. Confluent infarct tracking from the superior right temporal gyrus posteriorly through the lateral right occipital lobe. Additional infarct in the right insula, scattered in the right frontal lobe. 2. Cytotoxic edema and Heidelberg Classification 1A petechial blood. No intracranial mass effect. 3. Punctate diffusion abnormality also in the contralateral left parietal lobe. Chronic small cerebellar infarcts.   Cerebral Angio 12/27/2018 1116 RT common carotid arteriogram followed by partial revacularization of occluded RT MCA dominant inf division with x 2 passes with solitaire  76mm x 40 mm x retiever device achieving a TICI 2b revascularization  CT head 12/27/2018 1116 1. Abnormal hypodensity within the right insula consistent with acute infarction change. ASPECTS 9. 2. Generalized parenchymal atrophy with chronic small vessel ischemic disease. Chronic infarcts within the bilateral cerebellum. CTA neck: Common carotid, internal carotid and vertebral arteries patent within the neck bilaterally without significant stenosis.   Ct Angio Head W Or Wo Contrast 12/27/2018 1116 1. 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 Some reconstitution of flow is seen more distally within this vessel. 2. Additional intracranial atherosclerotic stenoses, most notably as follows. Mild/moderate focal stenosis within the intracranial left vertebral artery. Moderate focal stenosis within the P2 right posterior cerebral artery. 3. 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   Ct Angio Neck W Or Wo Contrast 12/27/2018 1116 Common carotid,  internal carotid and vertebral arteries patent within the neck bilaterally without significant stenosis.  Ct Cerebral Perfusion W Contrast 12/27/2018 1116 No core infarct is identified by the perfusion software. However, definite acute infarction changes are demonstrated within the right insula on concurrent noncontrast head CT. ASPECTS 9. 60 mL region of hypoperfused parenchyma detected predominantly within the right MCA vascular territory. Reported mismatch 60 mL.   Ct Head Wo Contrast 12/27/2018 1000 Atrophy with periventricular small vessel disease. Prior small infarct in the superior right cerebellum. No acute infarct evident. No mass or hemorrhage. There are foci of arterial vascular calcification. There is mucosal thickening in several ethmoid air cells.   Dg Chest 1 View 12/29/2018 Interval extubation. No interval change in the appearance of the lungs since the prior radiograph.   12/27/2018 1130 No active disease.   Portable Chest X-ray 12/27/2018 1718 Endotracheal tube well positioned 3 cm above the carina. Cardiomegaly. Previous CABG. No active process evident otherwise.   Dg Hip Unilat W Or Wo Pelvis 2-3 Views Left 12/27/2018 1130 Negative.    PHYSICAL EXAM- unchanged from yesterday  Temp:  [97.5 F (36.4 C)-98.4 F (36.9 C)] 98.4 F (36.9 C) (11/22 0740) Pulse Rate:  [57-93] 60 (11/22 0740) Resp:  [18-19] 18 (11/22 0305) BP: (129-183)/(62-75) 129/62 (11/22 0740) SpO2:  [97 %-99 %] 98 % (11/22 0305)  General - Well nourished, well developed, not in acute distress  Ophthalmologic - fundi not visualized due to noncooperation.  Cardiovascular - regular reate and rhythm, not in afib   Neuro - awake alert, orientated to self, age and place but not to situation, able to repeat simple sentences, but mild dysarthria, no aphasia but paucity of speech, naming 2/3, following most simple commands. Hard of hearing. Eyes bilateral horizontal movement, blinking to visual  threat bilaterally. Facial symmetrical. RUE 5/5 and purposeful movement, LUE 3/5 without drift . LLE 4/5 and RLE 4+/5. Sensation light touch symmetrical subjectively. Coordination not cooperative and gait not tested.    ASSESSMENT/PLAN [name] is a 85 y.o. female with history of HTN, HLD CAD found down by son presenting with L sided weakness, neglect and anosognosia. To IR for R M2 occlusion w/ TICI2b revascularization.  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 Old superior R cerebellar infarct.  CT head hypodensity R insula. Small vessel disease. Atrophy. Old bilateral cerebellar infarcts. ASPECTS 9    CTA head proximal R M2 occlusion. Additional intracranial atherosclerosis (mild/mod L VA, moderate R P2).  40mm paraclinoid L ICA aneurysm  CTA neck Unremarkable   CT perfusion no core infarct. R insular penumbra w/ 60 mL mismatch  Cerebral angio showed right M2 occlusion s/p TICI2b revascularization  MRI  right large MCA infarct with punctate left ACA infarct  MRA  Right MCA patent now   2D Echo EF 60-65%. No source of embolus   LDL 118  HgbA1c 6.1  eliquis for VTE prophylaxis  aspirin 81 mg daily prior to admission, now on aspirin 325 mg daily. Switched to eliquis Friday 12/31/18  Therapy recommendations:  CIR->SNF (rehab admission denied by insurance)-> family prefers home  Home PT, OT, SLP, RN, NA ordered alone with 3N1, w/c w/ cushion and hospital bed on Friday  Disposition:  D/c home once above delivered to home  Acute Respiratory Failure  Intubated for IR  Left intubated post IR as COVID pending   Covid resulted negative  Extubated 11/17  CCM signed off  Atrial Fibrillation w/ ectopy, new onset  Home anticoagulation:  none   CHA2DS2-VASc Score = 7, ?2 oral anticoagulation recommended  Age in Years:  ?46   +2    Sex:  Female   Female   +1     Hypertension History:  yes   +1     Diabetes Mellitus:  0  Congestive Heart Failure History:  0  Vascular Disease History:  yes   +1     Stroke/TIA/Thromboembolism History:  yes   +2  Eliquis started Friday 12/31/18   Hypotension Hx Hypertension  Home meds:  Metoprolol 25, triamterene-HCTZ 37.5-25  Stable BP  Treated with cleviprex, now off  Resume metoprolol 25mg  bid BP goal < 180 . Long-term BP goal normotensive  Hyperlipidemia  Home meds:  No statin  LDL 118, goal < 70  Now on lipitor 20 given advanced age  Continue statin at discharge  Dysphagia . Secondary to stroke . On D2 thin liquids . Speech on board . On IVF @ 30  Encourage po intake   UTI with leukocystosis  Leukocytosis. WBC 12.2->13.7->8.3->8.3->8.2->5.4->6.3  UA w/ trace leukocytoes, few bacteria, WBC 21-50.   UCx E Coli  Treated with Rocephin and 1 dose fosofomycin  Urinary retention   S/p several I/Os  Has foley now  Remove foley today. Voiding trials. If has to be replaced, [facility] RN can follow up. Arrangements in place.   Other Stroke Risk Factors  Advanced age  Coronary artery disease s/p CABG  Other Active Problems  Adenocarcinoma of the hepatic flexure  Hypokalemia K 3.3 - supplemented - 3.9-4.1->4.1  GERD on PPI  AKI. Cre 1.25->1.4->1.32->1.29->1.22 ->1.14 continue gentle hydration on IVF @30 , encourage POs  PLAN   Awaiting further word from case manager regarding pt's equipment for discharge home to family.  Added Norvasc yesterday for BP. Was going to restart pt's home medication Maxzide but got a pop up message that the medication was contraindicated in this pt. Not sure if it was due to age or renal function. May need to increase Norvasc at some point.  Hospital day # 6     To contact Stroke Continuity provider, please refer to [email or web address] After hours, contact General Neurology

## 2019-01-03 LAB — GLUCOSE, CAPILLARY
Glucose-Capillary: 111 mg/dL — ABNORMAL HIGH (ref 70–99)
Glucose-Capillary: 115 mg/dL — ABNORMAL HIGH (ref 70–99)

## 2019-01-03 MED ORDER — AMLODIPINE BESYLATE 5 MG PO TABS: 5.0000 mg | ORAL_TABLET | Freq: Every day | ORAL | 2 refills | Status: AC

## 2019-01-03 MED ORDER — APIXABAN 5 MG PO TABS: 5.0000 mg | ORAL_TABLET | Freq: Two times a day (BID) | ORAL | 2 refills | Status: AC

## 2019-01-03 MED ORDER — QUETIAPINE FUMARATE 25 MG PO TABS: 12.5000 mg | ORAL_TABLET | Freq: Every day | ORAL | 0 refills | Status: DC

## 2019-01-03 MED ORDER — METOPROLOL TARTRATE 25 MG PO TABS: 25.0000 mg | ORAL_TABLET | Freq: Two times a day (BID) | ORAL | 2 refills | Status: AC

## 2019-01-03 MED ORDER — ATORVASTATIN CALCIUM 20 MG PO TABS: 20.0000 mg | ORAL_TABLET | Freq: Every day | ORAL | 2 refills | Status: AC

## 2019-01-03 NOTE — Progress Notes (Signed)
Physical Therapy Treatment Patient Details Name: Alexis Haas MRN: [id number] DOB: Dec 16, 1917 Today's Date: 01/03/2019    History of Present Illness Pt is a 89 yoF originally presenting to [facility] with left sided weakness and AMS found to have acute right M2 occlusion. LSW 11/15 around 1800.  Transferred to [facility] for endovascular repair with partial revascularization of R MCA. PMHx: CAD s/p CABG 2005, HTN, HLD, anemia, CKD, arthritis.    PT Comments    Pt performed supine to sit and sat edge of bed with posterior bias.  Performed sit to stand attempt with RW due to feet sliding when attempt was made.  Utilized sara stedy to achieve standing to donn pants.  Pt continues to benefit from [facility] at SNF to improve strength and function before returning home.  Plan is for d/c home despite recs as family is refusing snf.    Follow Up Recommendations  Supervision/Assistance - 24 hour;SNF(Pt requesting to take family home therefore she will require HHPT)     Equipment Recommendations  Wheelchair (measurements PT);Wheelchair cushion (measurements PT);Other (comment)    Recommendations for Other Services       Precautions / Restrictions Precautions Precautions: Fall Precaution Comments: L inattention, hemiplegia Restrictions Weight Bearing Restrictions: No    Mobility  Bed Mobility Overal bed mobility: Needs Assistance Bed Mobility: Sit to Supine;Rolling;Sidelying to Sit Rolling: Max assist;+2 for physical assistance;+2 for safety/equipment Sidelying to sit: +2 for physical assistance;+2 for safety/equipment;Max assist   Sit to supine: Max assist;+2 for physical assistance;+2 for safety/equipment   General bed mobility comments: Maxx A +2 to advance LEs to egde of bed and to elevate trunk into a seated position.  Pt required cues for hand placement to use bed rails.  maxA to scoot hips to EOB  Transfers Overall transfer level: Needs assistance Equipment used: Rolling walker (2  wheeled);Ambulation equipment used(attempted with RW but pt lifts feet and presents with posterior bias.) Transfers: Sit to/from Stand Sit to Stand: +2 physical assistance;+2 safety/equipment;Max assist         General transfer comment: maxA+2 for sit<>stand;feet extended out infront of her despite knee block with use of RW, required use of sara stedy to achieve standing and donn lower body dressing.  Ambulation/Gait Ambulation/Gait assistance: (NT unable due to strong posterior bias.)               Stairs             Wheelchair Mobility    Modified Rankin (Stroke Patients Only) Modified Rankin (Stroke Patients Only) Pre-Morbid Rankin Score: No symptoms Modified Rankin: Severe disability     Balance Overall balance assessment: Needs assistance Sitting-balance support: Bilateral upper extremity supported Sitting balance-Leahy Scale: Poor Sitting balance - Comments: heavy posterior lean throughout most of sitting EOB, able to maintain balance when given task to engage in anteriorly Postural control: Posterior lean Standing balance support: Bilateral upper extremity supported Standing balance-Leahy Scale: Zero Standing balance comment: unable to achieve full standing despite total A +2 support                            Cognition Arousal/Alertness: Awake/alert Behavior During Therapy: WFL for tasks assessed/performed Overall Cognitive Status: Impaired/Different from baseline Area of Impairment: Orientation;Attention;Memory;Following commands;Safety/judgement;Awareness;Problem solving                 Orientation Level: Disoriented to;Time;Situation;Place Current Attention Level: Focused Memory: Decreased short-term memory Following Commands: Follows one step commands inconsistently;Follows  one step commands with increased time Safety/Judgement: Decreased awareness of deficits;Decreased awareness of safety Awareness: Intellectual Problem  Solving: Slow processing;Decreased initiation;Difficulty sequencing;Requires verbal cues;Requires tactile cues General Comments: pt pleasantly confused throughout session;requires consistent vc for processing and safety awareness, pt with fear of falling during sit<>stand stating "im going to fall"      Exercises      General Comments General comments (skin integrity, edema, etc.): vss;daughter present throughout session      Pertinent Vitals/Pain Pain Assessment: Faces Faces Pain Scale: Hurts little more Pain Location: knees, generalized (difficulty specifying) Pain Descriptors / Indicators: Aching;Sore Pain Intervention(s): Monitored during session;Limited activity within patient's tolerance    Home Living                      Prior Function            PT Goals (current goals can now be found in the care plan section) Acute Rehab PT Goals Patient Stated Goal: to go home Potential to Achieve Goals: Fair Progress towards PT goals: Progressing toward goals    Frequency    Min 3X/week      PT Plan Current plan remains appropriate    Co-evaluation PT/OT/SLP Co-Evaluation/Treatment: Yes Reason for Co-Treatment: Complexity of the patient's impairments (multi-system involvement) PT goals addressed during session: Mobility/safety with mobility OT goals addressed during session: ADL's and self-care      AM-PAC PT "6 Clicks" Mobility   Outcome Measure  Help needed turning from your back to your side while in a flat bed without using bedrails?: Total Help needed moving from lying on your back to sitting on the side of a flat bed without using bedrails?: Total Help needed moving to and from a bed to a chair (including a wheelchair)?: Total Help needed standing up from a chair using your arms (e.g., wheelchair or bedside chair)?: Total Help needed to walk in hospital room?: Total Help needed climbing 3-5 steps with a railing? : Total 6 Click Score: 6    End of  Session Equipment Utilized During Treatment: Gait belt Activity Tolerance: Patient tolerated treatment well Patient left: in chair;with call bell/phone within reach;with chair alarm set Nurse Communication: Mobility status PT Visit Diagnosis: Unsteadiness on feet (R26.81);Repeated falls (R29.6);Hemiplegia and hemiparesis Hemiplegia - Right/Left: Left Hemiplegia - dominant/non-dominant: Non-dominant Hemiplegia - caused by: Cerebral infarction     Time: [id number] PT Time Calculation (min) (ACUTE ONLY): 27 min  Charges:  $Therapeutic Activity: 8-22 mins                     Erasmo Leventhal , PTA Acute Rehabilitation Services Pager [phone] Office [phone]     Lelia Jons Eli Hose 01/03/2019, 3:33 PM

## 2019-01-03 NOTE — Discharge Instructions (Signed)
1. Call your urologist or Dr Lyndal Rainbow office to schedule an appointment to discuss foley catheter removal. A Registered Nurse will visit you at home. She will be able to help with any catheter questions or issues.. 2.. Therapists will come to your home to continue your therapy. 3. Report any abnormal bleeding to your doctor without delay.  Information on my medicine - ELIQUIS (apixaban)  This medication education was reviewed with me or my healthcare representative as part of my discharge preparation.    Why was Eliquis prescribed for you? Eliquis was prescribed for you to reduce the risk of forming blood clots that can cause a stroke if you have a medical condition called atrial fibrillation (a type of irregular heartbeat) OR to reduce the risk of a blood clots forming after orthopedic surgery.  What do You need to know about Eliquis ? Take your Eliquis TWICE DAILY - one tablet in the morning and one tablet in the evening with or without food.  It would be best to take the doses about the same time each day.  If you have difficulty swallowing the tablet whole please discuss with your pharmacist how to take the medication safely.  Take Eliquis exactly as prescribed by your doctor and DO NOT stop taking Eliquis without talking to the doctor who prescribed the medication.  Stopping may increase your risk of developing a new clot or stroke.  Refill your prescription before you run out.  After discharge, you should have regular check-up appointments with your healthcare provider that is prescribing your Eliquis.  In the future your dose may need to be changed if your kidney function or weight changes by a significant amount or as you get older.  What do you do if you miss a dose? If you miss a dose, take it as soon as you remember on the same day and resume taking twice daily.  Do not take more than one dose of ELIQUIS at the same time.  Important Safety Information A possible side  effect of Eliquis is bleeding. You should call your healthcare provider right away if you experience any of the following: ? Bleeding from an injury or your nose that does not stop. ? Unusual colored urine (red or dark brown) or unusual colored stools (red or black). ? Unusual bruising for unknown reasons. ? A serious fall or if you hit your head (even if there is no bleeding).  Some medicines may interact with Eliquis and might increase your risk of bleeding or clotting while on Eliquis. To help avoid this, consult your healthcare provider or pharmacist prior to using any new prescription or non-prescription medications, including herbals, vitamins, non-steroidal anti-inflammatory drugs (NSAIDs) and supplements.  This website has more information on Eliquis (apixaban): [email or web address].

## 2019-01-03 NOTE — Progress Notes (Signed)
Occupational Therapy Treatment Patient Details Name: Alexis Haas MRN: [id number] DOB: 01/23/1918 Today's Date: 01/03/2019    History of present illness Pt is a 42 yoF originally presenting to [facility] with left sided weakness and AMS found to have acute right M2 occlusion. LSW 11/15 around 1800.  Transferred to [facility] for endovascular repair with partial revascularization of R MCA. PMHx: CAD s/p CABG 2005, HTN, HLD, anemia, CKD, arthritis.   OT comments  Pt received in bed with her daughter present throughout the session. Pt required maxA+2 for UB and LB dressing. Pt required maxA+2 with use of stedy to stand from EOB, unable to progress to fully upright posture. Pt requires modA for grooming while at sink sitting on stedy. Pt will continue to benefit from skilled OT services to maximize safety and independence with ADL/IADL and functional mobility. Will continue to follow acutely and progress as tolerated. Anticipate d.c this date, plan is for pt to d/c home with family. Pt will need HHOT with 24/7 physical assistance if returning home. Continue to recommend SNF d/c prior to returning home to maximize pt's safety and independence with ADL/IADL and functional mobility.    Follow Up Recommendations  SNF;Supervision/Assistance - 24 hour    Equipment Recommendations  None recommended by OT    Recommendations for Other Services      Precautions / Restrictions Precautions Precautions: Fall Precaution Comments: L inattention, hemiplegia Restrictions Weight Bearing Restrictions: No       Mobility Bed Mobility Overal bed mobility: Needs Assistance Bed Mobility: Sit to Supine;Rolling;Sidelying to Sit Rolling: Max assist;+2 for physical assistance;+2 for safety/equipment Sidelying to sit: +2 for physical assistance;+2 for safety/equipment;Max assist   Sit to supine: Max assist;+2 for physical assistance;+2 for safety/equipment   General bed mobility comments: maxA to scoot hips to  EOB  Transfers Overall transfer level: Needs assistance Equipment used: Ambulation equipment used Transfers: Sit to/from Stand Sit to Stand: +2 physical assistance;+2 safety/equipment;Max assist         General transfer comment: maxA+2 for sit<>stand;feet extended out infront of her despite knee block    Balance Overall balance assessment: Needs assistance Sitting-balance support: Bilateral upper extremity supported Sitting balance-Leahy Scale: Poor Sitting balance - Comments: heavy posterior lean throughout most of sitting EOB, able to maintain balance when given task to engage in anteriorly Postural control: Posterior lean Standing balance support: Bilateral upper extremity supported Standing balance-Leahy Scale: Zero Standing balance comment: unable to achieve full standing despite total A +2 support                           ADL either performed or assessed with clinical judgement   ADL Overall ADL's : Needs assistance/impaired Eating/Feeding: Minimal assistance;Sitting;Bed level Eating/Feeding Details (indicate cue type and reason): able to self feed some bites, minA to open contianers and to eat full meal Grooming: Moderate assistance Grooming Details (indicate cue type and reason): required assistance for hair care and washing face, sitting in stedy at sink level         Upper Body Dressing : Maximal assistance;Sitting Upper Body Dressing Details (indicate cue type and reason): assisted pt in donning shirt, decreased shoulder ROM when putting arms through arm holes Lower Body Dressing: Maximal assistance;Sit to/from stand;+2 for safety/equipment;+2 for physical assistance;Total assistance   Toilet Transfer: Total assistance;+2 for physical assistance;+2 for safety/equipment;Squat-pivot;Cueing for sequencing;Cueing for safety           Functional mobility during ADLs: Total assistance;+2 for  physical assistance;+2 for safety/equipment;Cueing for  safety;Cueing for sequencing General ADL Comments: discussed use of lift equipment at home to assist pt with transfer;educated daughter on proper dressing techniques to accommodate for catheter;     Vision       Perception     Praxis      Cognition Arousal/Alertness: Awake/alert Behavior During Therapy: WFL for tasks assessed/performed Overall Cognitive Status: Impaired/Different from baseline Area of Impairment: Orientation;Attention;Memory;Following commands;Safety/judgement;Awareness;Problem solving                 Orientation Level: Disoriented to;Time;Situation;Place Current Attention Level: Focused Memory: Decreased short-term memory Following Commands: Follows one step commands inconsistently;Follows one step commands with increased time Safety/Judgement: Decreased awareness of deficits;Decreased awareness of safety Awareness: Intellectual Problem Solving: Slow processing;Decreased initiation;Difficulty sequencing;Requires verbal cues;Requires tactile cues General Comments: pt pleasantly confused throughout session;requires consistent vc for processing and safety awareness, pt with fear of falling during sit<>stand stating "im going to fall"        Exercises     Shoulder Instructions       General Comments vss;daughter present throughout session    Pertinent Vitals/ Pain       Pain Assessment: Faces Faces Pain Scale: Hurts little more Pain Location: knees, generalized (difficulty specifying) Pain Descriptors / Indicators: Aching;Sore Pain Intervention(s): Monitored during session;Limited activity within patient's tolerance  Home Living                                          Prior Functioning/Environment              Frequency  Min 2X/week        Progress Toward Goals  OT Goals(current goals can now be found in the care plan section)  Progress towards OT goals: Progressing toward goals  Acute Rehab OT Goals Patient  Stated Goal: to go home OT Goal Formulation: With patient Time For Goal Achievement: 01/12/19 Potential to Achieve Goals: Good ADL Goals Pt Will Perform Grooming: with supervision;sitting Pt Will Perform Upper Body Dressing: with supervision;sitting Pt Will Perform Lower Body Dressing: with mod assist;sitting/lateral leans;sit to/from stand Pt Will Transfer to Toilet: with mod assist;stand pivot transfer;bedside commode Pt Will Perform Toileting - Clothing Manipulation and hygiene: with mod assist;sit to/from stand;sitting/lateral leans Pt/caregiver will Perform Home Exercise Program: Left upper extremity;Increased strength;With written HEP provided;With minimal assist  Plan Frequency remains appropriate;Discharge plan remains appropriate    Co-evaluation    PT/OT/SLP Co-Evaluation/Treatment: Yes Reason for Co-Treatment: Complexity of the patient's impairments (multi-system involvement);To address functional/ADL transfers;For patient/therapist safety   OT goals addressed during session: ADL's and self-care      AM-PAC OT "6 Clicks" Daily Activity     Outcome Measure   Help from another person eating meals?: A Lot Help from another person taking care of personal grooming?: A Lot Help from another person toileting, which includes using toliet, bedpan, or urinal?: A Lot Help from another person bathing (including washing, rinsing, drying)?: A Lot Help from another person to put on and taking off regular upper body clothing?: A Lot Help from another person to put on and taking off regular lower body clothing?: A Lot 6 Click Score: 12    End of Session Equipment Utilized During Treatment: Gait belt(stedy)  OT Visit Diagnosis: Unsteadiness on feet (R26.81);Muscle weakness (generalized) (M62.81);Other symptoms and signs involving cognitive function;Other abnormalities of gait and mobility (R26.89)  Activity Tolerance Patient tolerated treatment well   Patient Left in bed;with call  bell/phone within reach;with bed alarm set;with family/visitor present   Nurse Communication Mobility status        Time: [id number] OT Time Calculation (min): 48 min  Charges: OT General Charges $OT Visit: 1 Visit OT Treatments $Self Care/Home Management : 23-37 mins  Dorinda Hill OTR/L Acute Rehabilitation Services Office: [place] 01/03/2019, 1:53 PM

## 2019-01-03 NOTE — TOC Transition Note (Signed)
Transition of Care [facility]) - CM/SW Discharge Note   Patient Details  Name: Alexis Haas MRN: [id number] Date of Birth: 12-17-1917  Transition of Care [facility]) CM/SW Contact:  Pollie Friar, RN Phone Number: 01/03/2019, 1:14 PM   Clinical Narrative:    Pt discharging home today with [facility] services through [facility]. Cheryl with [facility] aware of d/c. DME has been delivered to the home.  Pt to transport via PT[facility]. Address verified and PTAR scheduled. Bedside RN updated and d/c packet at the desk.    Final next level of care: Home w Home Health Services Barriers to Discharge: No Barriers Identified   Patient Goals and CMS Choice   CMS Medicare.gov Compare Post Acute Care list provided to:: Patient Represenative (must comment) Choice offered to / list presented to : Adult Children(daughter)  Discharge Placement                       Discharge Plan and Services   Discharge Planning Services: CM Consult Post Acute Care Choice: Home Health, Durable Medical Equipment          DME Arranged: 3-N-1, Hospital bed, Wheelchair manual DME Agency: [employer]       HH Arranged: RN, PT, OT, Nurse's Aide [place] Agency: [place] Date [place]: 01/03/19   Representative spoke with at [facility]: updated cheryl on d/c  Social Determinants of Health (SDOH) Interventions     Readmission Risk Interventions No flowsheet data found.

## 2019-01-03 NOTE — Progress Notes (Signed)
  Speech Language Pathology Treatment: Dysphagia  Patient Details Name: Alexis Haas MRN: [id number] DOB: 1917-12-05 Today's Date: 01/03/2019 Time: [id number] SLP Time Calculation (min) (ACUTE ONLY): 13 min  Assessment / Plan / Recommendation Clinical Impression  Pt animated, interactive, feeding herself breakfast upon entering room.  Excellent overall toleration with no difficulty masticating dys2 solids without dentures.  Tolerates thin liquids without concern for aspiration.  Pt consumed nearly 100% of breakfast.  No further concerns for dysphagia.  No SLP f/u is needed after D/C.  Advance to dysphagia 3.  SLP to sign off.   HPI HPI: 85 yo female admitted 12/27/2018 after being found down by her son, with dysarthria and left side weakness. PMH: CAD, GERD, HTN, CKD. MRI infarct in the right insula, scattered in the right frontal lobe; Chronic small cerebellar infarcts.       SLP Plan  Discharge SLP treatment due to (comment) - dysphagia resolved.       Recommendations  Diet recommendations: Dysphagia 3;Thin liquid Liquids provided via: Cup;Straw Medication Administration: Whole meds with puree Supervision: Patient able to self feed Compensations: Small sips/bites;Slow rate                Oral Care Recommendations: Oral care BID Follow up Recommendations: None SLP Visit Diagnosis: Dysphagia, unspecified (R13.10) Plan: Discharge SLP treatment due to (comment)       GO                Juan Quam Laurice 01/03/2019, 10:30 AM  Estill Bamberg L. Tivis Ringer, [place] Office number [phone] Pager [phone]

## 2019-01-05 NOTE — Progress Notes (Signed)
PT TREATMENT (Late entry note)  Clinical Impression:    Session focused on sitting balance and transfer training. Requiring two person maximal assist to stand to walker but unable to initiate steps due to fear of falling and weakness. Trialed Stedy for transfer to chair, however, due to pt significant decreased attention/awareness, she was unable to sequence for use, requiring heavy + 2 assist to boost up. Continues with left sided weakness (left upper extremity weaker than lower). Continue to recommend comprehensive inpatient rehab (CIR) for post-acute therapy needs.       12/30/18 1701  PT Visit Information  Last PT Received On 12/30/18  Assistance Needed +2  History of Present Illness Pt is a 61 yoF originally presenting to [facility] with left sided weakness and AMS found to have acute right M2 occlusion. LSW 11/15 around 1800.  Transferred to [facility] for endovascular repair with partial revascularization of R MCA. PMHx: CAD s/p CABG 2005, HTN, HLD, anemia, CKD, arthritis.  Subjective Data  Patient Stated Goal Daughter is interested in CIR  Precautions  Precautions Fall  Restrictions  Weight Bearing Restrictions No  Pain Assessment  Pain Assessment Faces  Faces Pain Scale 4  Pain Location right knee with attempts to flex (chronic)  Pain Descriptors / Indicators Grimacing  Pain Intervention(s) Monitored during session;Repositioned  Cognition  Arousal/Alertness Awake/alert  Behavior During Therapy WFL for tasks assessed/performed  Overall Cognitive Status Impaired/Different from baseline  Area of Impairment Orientation;Attention;Following commands;Safety/judgement;Awareness;Problem solving  Orientation Level Disoriented to;Place;Time;Situation  Current Attention Level Focused;Sustained  Following Commands Follows one step commands inconsistently  Safety/Judgement Decreased awareness of safety;Decreased awareness of deficits  Awareness Intellectual  Problem Solving Decreased  initiation;Requires verbal cues;Requires tactile cues  General Comments Pt with signficantly decreased attention span, highly distractable by lines/environmental distractions, which inhibits ability to follow simple commands consistently. Oriented to self only, stating she was in her kitchen. Decreased awareness of deficits, asking to go home. Unable to bring insight into level of severity of her deficits.  Bed Mobility  Overal bed mobility Needs Assistance  Bed Mobility Supine to Sit  Sidelying to sit Max assist  General bed mobility comments Requires assist for bring BLE's over to edge, trunk elevation  Transfers  Overall transfer level Needs assistance  Equipment used Rolling walker (2 wheeled);Ambulation equipment used  Pharmacologist Sit to/from Stand  Sit to Stand Max assist;+2 physical assistance;Total assist  General transfer comment Initially attempted standing from edge of bed to walker with cues for hand placement. Pt requiring increased time for initiation, fear of falling once standing and requesting to sit back down despite cues for upright. Attempted use of Stedy, however, pt with significant difficulty sequencing and attending, requiring totalA to boost up to stand enough to get flaps underneath.   Modified Rankin (Stroke Patients Only)  Pre-Morbid Rankin Score 0  Modified Rankin 5  Balance  Overall balance assessment Needs assistance  Sitting-balance support Single extremity supported;Feet supported  Sitting balance-Leahy Scale Poor  Sitting balance - Comments posterior lean, requiring min-mod assist  Standing balance support Bilateral upper extremity supported  Standing balance-Leahy Scale Zero  PT - End of Session  Equipment Utilized During Treatment Gait belt  Activity Tolerance Patient tolerated treatment well  Patient left in chair;with call bell/phone within reach;with chair alarm set;with family/visitor present  Nurse Communication  Mobility status   PT - Assessment/Plan  PT Plan Current plan remains appropriate  PT Visit Diagnosis Unsteadiness on feet (R26.81);Repeated falls (R29.6);Hemiplegia and  hemiparesis  Hemiplegia - Right/Left Left  Hemiplegia - dominant/non-dominant Non-dominant  Hemiplegia - caused by Cerebral infarction  PT Frequency (ACUTE ONLY) Min 4X/week  Follow Up Recommendations CIR;Supervision/Assistance - 24 hour  PT equipment Wheelchair (measurements PT);Wheelchair cushion (measurements PT)  AM-PAC PT "6 Clicks" Mobility Outcome Measure (Version 2)  Help needed turning from your back to your side while in a flat bed without using bedrails? 1  Help needed moving from lying on your back to sitting on the side of a flat bed without using bedrails? 1  Help needed moving to and from a bed to a chair (including a wheelchair)? 1  Help needed standing up from a chair using your arms (e.g., wheelchair or bedside chair)? 1  Help needed to walk in hospital room? 1  Help needed climbing 3-5 steps with a railing?  1  6 Click Score 6  Consider Recommendation of Discharge To: CIR/SNF/LTACH  Acute Rehab PT Goals  Potential to Achieve Goals Good  PT Time Calculation  PT Start Time (ACUTE ONLY) 1205  PT Stop Time (ACUTE ONLY) 1239  PT Time Calculation (min) (ACUTE ONLY) 34 min  PT General Charges  $$ ACUTE PT VISIT 1 Visit  PT Treatments  $Therapeutic Activity 23-37 mins   Ellamae Sia, PT, DPT Acute Rehabilitation Services Pager [phone] Office [phone]

## 2019-01-25 ENCOUNTER — Ambulatory Visit (INDEPENDENT_AMBULATORY_CARE_PROVIDER_SITE_OTHER): Payer: PPO | Admitting: Urology

## 2019-01-25 ENCOUNTER — Encounter: Payer: Self-pay | Admitting: Urology

## 2019-01-25 ENCOUNTER — Ambulatory Visit: Payer: PPO | Admitting: Urology

## 2019-01-25 ENCOUNTER — Other Ambulatory Visit: Payer: Self-pay

## 2019-01-25 VITALS — BP 126/61 | HR 66 | Ht 64.0 in | Wt 173.0 lb

## 2019-01-25 DIAGNOSIS — R339 Retention of urine, unspecified: Secondary | ICD-10-CM

## 2019-01-25 LAB — BLADDER SCAN AMB NON-IMAGING

## 2019-01-25 MED ORDER — CIPROFLOXACIN HCL 500 MG PO TABS
500.0000 mg | ORAL_TABLET | Freq: Once | ORAL | Status: AC
  Administered 2019-01-25: 500 mg via ORAL

## 2019-01-25 NOTE — Progress Notes (Signed)
01/25/19 9:39 AM   Alexis Haas 06/08/1917 [id number]  Referring provider: Lynnell Jude, MD [address] [place],  [place] [address]  CC: Urinary retention  HPI: I saw [name] in urology clinic today for evaluation of urinary retention.  She is an extremely comorbid and frail-appearing 85 year old female with past medical history notable for stage III colon cancer with liver metastasis, CAD on anticoagulation, and recent hospitalization in mid November for right MCA stroke.  During this hospitalization she developed urinary retention as well as a possible E. coli/Enterococcus UTI.  Her Foley remains in place draining clear yellow urine.  She denies any difficulty urinating prior to this hospitalization/stroke including gross hematuria, feeling of incomplete emptying, incontinence, history of UTI, or history of retention.  She is moderately bothered by the catheter.  She is here in a wheelchair today with a family member.  She denies any fevers or chills or flank pain.   PMH: Past Medical History:  Diagnosis Date  . Anemia   . Arthritis   . Cancer ([place]) 04/15/2016   T3, N1a adenocarcinoma of the hepatic flexure. No mismatch repair gene abnormality.   . Chronic kidney disease    LABS 3/18 COMPARED WITH 1 YEAR AGO  . Coronary artery disease   . Fall 2018  . GERD (gastroesophageal reflux disease)   . Heart disease   . Hypercholesteremia   . Hypertension   . Insomnia     Surgical History: Past Surgical History:  Procedure Laterality Date  . CARDIAC SURGERY  2005   bypass  . COLONOSCOPY WITH PROPOFOL N/A 04/15/2016   Procedure: COLONOSCOPY WITH PROPOFOL;  Surgeon: Lucilla Lame, MD;  Location: [facility] ENDOSCOPY;  Service: Endoscopy;  Laterality: N/A;  . CORONARY ARTERY BYPASS GRAFT  2005   x 2  . ESOPHAGOGASTRODUODENOSCOPY (EGD) WITH PROPOFOL N/A 04/15/2016   Procedure: ESOPHAGOGASTRODUODENOSCOPY (EGD) WITH PROPOFOL;  Surgeon: Lucilla Lame, MD;  Location: [facility] ENDOSCOPY;  Service:  Endoscopy;  Laterality: N/A;  . IR ANGIO VERTEBRAL SEL SUBCLAVIAN INNOMINATE UNI R MOD SED  12/27/2018  . IR CT HEAD LTD  12/27/2018  . IR PERCUTANEOUS ART THROMBECTOMY/INFUSION INTRACRANIAL INC DIAG ANGIO  12/27/2018  . LAPAROSCOPIC RIGHT COLECTOMY Right 06/05/2016   Procedure: LAPAROSCOPIC RIGHT COLECTOMY;  Surgeon: Robert Bellow, MD;  Location: [facility] ORS;  Service: General;  Laterality: Right;  . RADIOLOGY WITH ANESTHESIA N/A 12/27/2018   Procedure: CODE STROKE;  Surgeon: Radiologist, Medication, MD;  Location: [place];  Service: Radiology;  Laterality: N/A;  . toe removal    . TONSILLECTOMY     age 85  . UMBILICAL HERNIA REPAIR  06/05/2016   Procedure: HERNIA REPAIR UMBILICAL ADULT;  Surgeon: Robert Bellow, MD;  Location: [facility] ORS;  Service: General;;    Allergies:  Allergies  Allergen Reactions  . Amoxicillin Rash    Has patient had a PCN reaction causing immediate rash, facial/tongue/throat swelling, SOB or lightheadedness with hypotension:Yes Has patient had a PCN reaction causing severe rash involving mucus membranes or skin necrosis:unsure Has patient had a PCN reaction that required hospitalization:No Has patient had a PCN reaction occurring within the last 10 years:No If all of the above answers are "NO", then may proceed with Cephalosporin use.     Family History: No family history on file.  Social History:  reports that she has never smoked. She has never used smokeless tobacco. She reports that she does not drink alcohol or use drugs.  ROS: Please see flowsheet from today's date for  complete review of systems.  Physical Exam: BP 126/61 (BP Location: Left Arm, Patient Position: Sitting, Cuff Size: Normal)   Pulse 66   Ht '5\' 4"'  (1.626 m)   Wt 173 lb (78.5 kg)   BMI 29.70 kg/m    Constitutional: Elderly, frail-appearing Cardiovascular: No clubbing, cyanosis, or edema. Respiratory: Normal respiratory effort, no increased work of breathing. GI: Abdomen is  soft, nontender, nondistended, no abdominal masses GU: Foley with clear yellow urine Psychiatric: Normal mood and affect.  Laboratory Data: Prior urine culture data reviewed  Pertinent Imaging: No cross-sectional imaging to review  Assessment & Plan:   In summary, the patient is a 85 year old very comorbid and frail female with stage III colon cancer metastatic to the liver, CAD on anticoagulation, and recent MCA stroke and hospitalization in mid November complicated by urinary retention and UTI.  We had a long conversation about her numerous comorbidities and options for bladder management including chronic indwelling Foley that needs to be changed monthly, clean intermittent catheterization 3-4 times per day, or voiding trial in clinic here today.  She return to clinic in the afternoon was still unable to void and bladder scan was elevated at 530 mL.  We discussed the need to replace Foley catheter for bladder management.  We discussed return precautions including fever over 101 or significant gross hematuria.  RTC 4 weeks for repeat voiding trial, but anticipate she likely will need chronic Foley with her stroke and frailty  A total of 60 minutes were spent face-to-face with the patient, greater than 50% was spent in patient education, counseling, and coordination of care regarding urinary retention and bladder management options.   Billey Co, [facility] [address] [place], [place] [address] [phone]

## 2019-01-25 NOTE — Progress Notes (Signed)
Fill and Pull Catheter Removal  Patient is present today for a catheter removal. Patient given one time dose of Cipro, per verbal orders from Dr. Diamantina Providence. Patient was cleaned and prepped in a sterile fashion 346ml of sterile water/ saline was instilled into the bladder when the patient felt the urge to urinate. 33ml of water was then drained from the balloon.  A 16FR foley cath was removed from the bladder no complications were noted. Patient as then given some time to void on their own. Patient is unable to void. PVR 318mL. Pt advised to go home, push fluids, and attempt to void frequently. Pt to RTC at 1pm this after for PVR and possible cath placement. Pt voiced understanding.  Performed by: Gordy Clement, CMA (AAMA)  Follow up/ Additional notes: RTC at 1pm

## 2019-01-25 NOTE — Progress Notes (Signed)
Bladder Scan Patient cannot void: 551 ml Performed By: Gordy Clement, CMA  Simple Catheter Placement  Due to urinary retention patient is present today for a foley cath placement.  Patient was cleaned and prepped in a sterile fashion with betadine and lidocaine jelly 2% was instilled into the urethra.  A 16 coude foley (due to lack of 62fr) catheter was inserted, urine return was noted 425 ml, urine was yellow in color.  The balloon was filled with 10cc of sterile water.  A night bag was attached for drainage.   Patient was given instruction on proper catheter care.  Patient tolerated well, no complications were noted   Performed by: Elberta Leatherwood, CMA  Additional notes/ Follow up: as scheduled

## 2019-02-10 ENCOUNTER — Ambulatory Visit: Payer: Medicare HMO | Admitting: Hematology and Oncology

## 2019-02-10 ENCOUNTER — Other Ambulatory Visit: Payer: Medicare HMO

## 2019-02-14 ENCOUNTER — Other Ambulatory Visit: Payer: Self-pay

## 2019-02-15 ENCOUNTER — Other Ambulatory Visit
Admission: RE | Admit: 2019-02-15 | Discharge: 2019-02-15 | Disposition: A | Discharge status: Home / Self Care | Payer: Medicare HMO | Source: Ambulatory Visit | Attending: Urology | Admitting: Urology

## 2019-02-15 ENCOUNTER — Ambulatory Visit: Payer: Medicare HMO | Admitting: Urology

## 2019-02-15 ENCOUNTER — Inpatient Hospital Stay: Payer: Medicare HMO | Attending: Hematology and Oncology

## 2019-02-15 VITALS — BP 132/58 | HR 53

## 2019-02-15 DIAGNOSIS — D500 Iron deficiency anemia secondary to blood loss (chronic): Secondary | ICD-10-CM

## 2019-02-15 DIAGNOSIS — Z466 Encounter for fitting and adjustment of urinary device: Secondary | ICD-10-CM

## 2019-02-15 DIAGNOSIS — C787 Secondary malignant neoplasm of liver and intrahepatic bile duct: Secondary | ICD-10-CM | POA: Insufficient documentation

## 2019-02-15 DIAGNOSIS — Z87440 Personal history of urinary (tract) infections: Secondary | ICD-10-CM | POA: Diagnosis not present

## 2019-02-15 DIAGNOSIS — C182 Malignant neoplasm of ascending colon: Secondary | ICD-10-CM

## 2019-02-15 DIAGNOSIS — D509 Iron deficiency anemia, unspecified: Secondary | ICD-10-CM | POA: Insufficient documentation

## 2019-02-15 LAB — CBC WITH DIFFERENTIAL/PLATELET
Abs Immature Granulocytes: 0.04 10*3/uL (ref 0.00–0.07)
Basophils Absolute: 0 10*3/uL (ref 0.0–0.1)
Basophils Relative: 0 %
Eosinophils Absolute: 0.1 10*3/uL (ref 0.0–0.5)
Eosinophils Relative: 2 %
HCT: 38.8 % (ref 36.0–46.0)
Hemoglobin: 12.8 g/dL (ref 12.0–15.0)
Immature Granulocytes: 1 %
Lymphocytes Relative: 15 %
Lymphs Abs: 0.9 10*3/uL (ref 0.7–4.0)
MCH: 30.2 pg (ref 26.0–34.0)
MCHC: 33 g/dL (ref 30.0–36.0)
MCV: 91.5 fL (ref 80.0–100.0)
Monocytes Absolute: 0.7 10*3/uL (ref 0.1–1.0)
Monocytes Relative: 11 %
Neutro Abs: 4.1 10*3/uL (ref 1.7–7.7)
Neutrophils Relative %: 71 %
Platelets: 215 10*3/uL (ref 150–400)
RBC: 4.24 MIL/uL (ref 3.87–5.11)
RDW: 14.4 % (ref 11.5–15.5)
WBC: 5.7 10*3/uL (ref 4.0–10.5)
nRBC: 0 % (ref 0.0–0.2)

## 2019-02-15 LAB — COMPREHENSIVE METABOLIC PANEL
ALT: 22 U/L (ref 0–44)
AST: 25 U/L (ref 15–41)
Albumin: 3.9 g/dL (ref 3.5–5.0)
Alkaline Phosphatase: 46 U/L (ref 38–126)
Anion gap: 8 (ref 5–15)
BUN: 19 mg/dL (ref 8–23)
CO2: 24 mmol/L (ref 22–32)
Calcium: 8.2 mg/dL — ABNORMAL LOW (ref 8.9–10.3)
Chloride: 104 mmol/L (ref 98–111)
Creatinine, Ser: 1.18 mg/dL — ABNORMAL HIGH (ref 0.44–1.00)
GFR calc Af Amer: 44 mL/min — ABNORMAL LOW (ref >60)
GFR calc non Af Amer: 38 mL/min — ABNORMAL LOW (ref >60)
Glucose, Bld: 70 mg/dL (ref 70–99)
Potassium: 4.1 mmol/L (ref 3.5–5.1)
Sodium: 136 mmol/L (ref 135–145)
Total Bilirubin: 0.8 mg/dL (ref 0.3–1.2)
Total Protein: 6.8 g/dL (ref 6.5–8.1)

## 2019-02-15 LAB — BLADDER SCAN AMB NON-IMAGING

## 2019-02-15 LAB — FERRITIN: Ferritin: 128 ng/mL (ref 11–307)

## 2019-02-15 MED ORDER — LEVOFLOXACIN 500 MG PO TABS
500.0000 mg | ORAL_TABLET | Freq: Once | ORAL | Status: AC
  Administered 2019-02-15: 500 mg via ORAL

## 2019-02-15 NOTE — Progress Notes (Signed)
Fill and Pull Catheter Removal  Patient is present today for a catheter removal. Per providers verbal orders pt given Levaquin 500mg  as a prophylactic. Patient was cleaned and prepped in a sterile fashion 273ml of sterile water/ saline was instilled into the bladder when the patient felt the urge to urinate. 25ml of water was then drained from the balloon.  A 16FR foley cath was removed from the bladder no complications were noted. Patient tolerated well. Patient was then given some time to void on their own. Patient is unable to void. Per Diamantina Providence pt advised to come push fluids and RTC this afternoon for PVR.   Performed by: Gordy Clement, CMA   Follow up/ Additional notes: RTC this PM for PVR    Simple Catheter Placement  Due to urinary retention patient is present today for a foley cath placement. Patient was cleaned and prepped in a sterile fashion with betadine and lidocaine jelly 2% was instilled into the urethra.  A 16FR foley catheter was inserted, urine return was noted  1052ml, urine was pale yellow in color. The balloon was filled with 10cc of sterile water.  A night bag was attached for drainage. Patient was given instruction on proper catheter care. Patient tolerated well, no complications were noted   Performed by: Gordy Clement, CMA   Additional notes/ Follow up: RTC in 1 month for cath change

## 2019-02-15 NOTE — Progress Notes (Signed)
   02/15/2019 10:37 AM   Alexis Haas Oct 01, 1917 [id number]  Reason for visit: Follow up urinary retention  HPI: I saw Alexis Haas and her caregiver back in urology clinic today for repeat voiding trial for urinary retention.  She is a very co-morbid and frail-appearing 86 year old female with metastatic colon cancer, CAD on anticoagulation, and recent MCA stroke and hospitalization in mid November complicated by urinary retention and UTI.  She most recently failed a voiding trial on 01/25/2019.  She failed a voiding trial again today and return to clinic this afternoon with a PVR of >934ml and inability to void.  She was given a single dose of Levaquin prior to Foley removal today.  Foley was replaced in clinic this afternoon.  We had a discussion about urinary retention in the setting of recent stroke, and I recommended monthly Foley changes for the next 3 months.  If the patient desires, can attempt a repeat voiding trial in around 3 months, but I was very frank with the patient that she likely will require chronic Foley with monthly changes.  Maintain Foley, monthly changes RTC with PA in 3 months to consider repeat voiding trial  A total of 35 minutes were spent face-to-face with the patient, greater than 50% was spent in patient education, counseling, and coordination of care regarding urinary retention and bladder management options.  Billey Co, [facility] [address] [place], [place] [address] [phone]

## 2019-02-15 NOTE — Progress Notes (Signed)
[facility]  [address] [place], [place] [address] Phone: [phone]  Fax: [phone]   Telephone Office Visit:  02/16/2019  Referring physician: Lynnell Jude, MD  I connected with Alexis Haas on 02/16/19 at 11:24 AM by telephone and verified that I was speaking with the correct person using 2 identifiers.  The patient was at home.  I discussed the limitations, risk, security and privacy concerns of performing an evaluation and management service by telephone and the availability of in person appointments.  I also discussed with the patient that there may be a patient responsible charge related to this service.  The patient expressed understanding and agreed to proceed.   Chief Complaint: Alexis Haas is a 86 y.o. female with stage III colon cancer and iron deficiency anemia who is seen for 3 month assessment   HPI: The patient was last seen in the medical oncology clinic on 09/10/2018. At that time, she denied any abdominal pain.  PET scan revealed an isolated metastasis in the liver.  Treatment options were discussed.  She was unsure if she wanted treatment.  Tumor board recommendations on 09/16/2018 were Y-90 vs no treatment at all.  She was admitted to [facility] from 12/27/2018 to 01/03/2019 with a right acute middle cerebral artery (MCA) ischemic stroke.  She presented to [facility].  CTA showed a proximal right M2 occlusion with 5m cerebrum at risk on CTP.  She was outside the time window for tPA.  [facility]  She underwent thrombectomy by interventional radiology on 12/27/2018.  She was on aspirin 81 mg/day prior to admission.  She was switched to Eliquis on 12/31/2018.  She was discharged home.  She had urinary retention was hospitalized.  She was seen in follow-up by Dr BNickolas Madrid  She failed a voiding trial on 01/25/2019 and 02/15/2019.  PVR was > 999 ml; she was unable to void.  Plan was to maintain the Foley  catheter with monthly exchanges.  Follow-up scheduled in 3 months with a repeat voiding trial.  CBC followed 12/28/2018: Hematocrit 38.8, hemoglobin 13.2, MCV 89.2, platelets 203,000, WBC 13700.  12/29/2018: Hematocrit 37.2, hemoglobin 12.2, MCV 92.3, platelets 140,000, WBC 8300. 12/30/2018: Hematocrit 32.1, hemoglobin 10.9, MCV 88.9, platelets 149,000, WBC 8300.  12/31/2018: Hematocrit 33.0, hemoglobin 11.3, MCV 88.5, platelets 159,000, WBC 8200.  01/01/2019: Hematocrit 34.9, hemoglobin 11.6, MCV 89.5, platelets 184,000, WBC 5400.  01/02/2019: Hematocrit 37.4, hemoglobin 12.5, MCV 89.3, platelets 216,000, WBC 6300.  02/15/2019: Hematocrit 38.8, hemoglobin 12.8, MCV 91.5, platelets 215,000, WBC 5700. Ferritin 128.  During the interim, KMordecai Maes her granddaughter, says that she still has bruising on her arms but nothing major and no issues with bleeding. She takes a daily iron supplement.   She has an appointment with neurology next week.  Symptomatically, she has dementia types symptoms. She denies any numbness, but can't move as quickly.  She can use her walker and eat by herself but her granddaughter gets her dressed. She has issues with sleeping.  She has a hospital bed at her house; she can't get up on her own from the bed. If she is in her own bed, she can get up from the bed on her own. She has only been sleeping for 2 hours/night.  This past weekend, she went to bed at 8 PM and got up at 10 PM thinking it was the morning ; she got up getting ready for the day.   Regarding her  dementia, she doesn't think she's in her house; She thinks that she is her home in [place] or childhood home in [place], [place]. She has gone to her neighbors house thinking it is her house or that she is in a nursing facility. She sometimes think her granddaughter is her caregiver.  She get agitated at night and "demands" to go home.  Mordecai Maes will drive her grandmother around the the block and come back home and she will  recognize that she is home.    Mordecai Maes works in [place] but will be staying here to help her grandmother.    Past Medical History:  Diagnosis Date  . Anemia   . Arthritis   . Cancer ([place]) 04/15/2016   T3, N1a adenocarcinoma of the hepatic flexure. No mismatch repair gene abnormality.   . Chronic kidney disease    LABS 3/18 COMPARED WITH 1 YEAR AGO  . Coronary artery disease   . Fall 2018  . GERD (gastroesophageal reflux disease)   . Heart disease   . Hypercholesteremia   . Hypertension   . Insomnia     Past Surgical History:  Procedure Laterality Date  . CARDIAC SURGERY  2005   bypass  . COLONOSCOPY WITH PROPOFOL N/A 04/15/2016   Procedure: COLONOSCOPY WITH PROPOFOL;  Surgeon: Lucilla Lame, MD;  Location: [facility] ENDOSCOPY;  Service: Endoscopy;  Laterality: N/A;  . CORONARY ARTERY BYPASS GRAFT  2005   x 2  . ESOPHAGOGASTRODUODENOSCOPY (EGD) WITH PROPOFOL N/A 04/15/2016   Procedure: ESOPHAGOGASTRODUODENOSCOPY (EGD) WITH PROPOFOL;  Surgeon: Lucilla Lame, MD;  Location: [facility] ENDOSCOPY;  Service: Endoscopy;  Laterality: N/A;  . IR ANGIO VERTEBRAL SEL SUBCLAVIAN INNOMINATE UNI R MOD SED  12/27/2018  . IR CT HEAD LTD  12/27/2018  . IR PERCUTANEOUS ART THROMBECTOMY/INFUSION INTRACRANIAL INC DIAG ANGIO  12/27/2018  . LAPAROSCOPIC RIGHT COLECTOMY Right 06/05/2016   Procedure: LAPAROSCOPIC RIGHT COLECTOMY;  Surgeon: Robert Bellow, MD;  Location: [facility] ORS;  Service: General;  Laterality: Right;  . RADIOLOGY WITH ANESTHESIA N/A 12/27/2018   Procedure: CODE STROKE;  Surgeon: Radiologist, Medication, MD;  Location: [place];  Service: Radiology;  Laterality: N/A;  . toe removal    . TONSILLECTOMY     age 17  . UMBILICAL HERNIA REPAIR  06/05/2016   Procedure: HERNIA REPAIR UMBILICAL ADULT;  Surgeon: Robert Bellow, MD;  Location: [facility] ORS;  Service: General;;    History reviewed. No pertinent family history.  Social History:  reports that she has never smoked. She has never used smokeless  tobacco. She reports that she does not drink alcohol or use drugs. She states that she has worked her whole life (since the age of 42). She worked at [employer] part time. 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 She lives in [place]. She lives alone. Her husband died 63 years ago. Her daughter is Theodora Blow is power of attorney (home: [phone]; work: [phone] cell: [phone]) The patient is accompanied by [place] her granddaughter from [place], today.  Allergies:  Allergies  Allergen Reactions  . Amoxicillin Rash    Has patient had a PCN reaction causing immediate rash, facial/tongue/throat swelling, SOB or lightheadedness with hypotension:Yes Has patient had a PCN reaction causing severe rash involving mucus membranes or skin necrosis:unsure Has patient had a PCN reaction that required hospitalization:No Has patient had a PCN reaction occurring within the last 10 years:No If all of the above answers are "NO", then may proceed with Cephalosporin use.   Marland Kitchen  Ciprofloxacin Nausea And Vomiting    Current Medications: Current Outpatient Medications  Medication Sig Dispense Refill  . acetaminophen (TYLENOL) 500 MG tablet Take 500 mg by mouth daily.    Marland Kitchen amLODipine (NORVASC) 5 MG tablet Take 1 tablet (5 mg total) by mouth daily. 30 tablet 2  . apixaban (ELIQUIS) 5 MG TABS tablet Take 1 tablet (5 mg total) by mouth 2 (two) times daily. 60 tablet 2  . atorvastatin (LIPITOR) 20 MG tablet Take 1 tablet (20 mg total) by mouth daily at 6 PM. 30 tablet 2  . ferrous sulfate 325 (65 FE) MG tablet Take 325 mg by mouth daily with breakfast.    . metoprolol tartrate (LOPRESSOR) 25 MG tablet Take 1 tablet (25 mg total) by mouth 2 (two) times daily. 60 tablet 2  . omeprazole (PRILOSEC) 20 MG capsule Take 20 mg by mouth at bedtime.     Marland Kitchen QUEtiapine (SEROQUEL) 25 MG tablet Take 0.5 tablets (12.5 mg total) by mouth at bedtime. (Patient taking differently: Take 12.5 mg  by mouth at bedtime as needed. ) 15 tablet 0   No current facility-administered medications for this visit.    Review of Systems  Constitutional: Negative for chills, diaphoresis, fever, malaise/fatigue and weight loss (no new weight).       Needs assistance with ADLs since CVA.  HENT: Positive for hearing loss. Negative for congestion, sinus pain and sore throat.   Eyes: Negative.  Negative for blurred vision, double vision and photophobia.  Respiratory: Negative.  Negative for cough, hemoptysis, shortness of breath and wheezing.   Cardiovascular: Negative.  Negative for chest pain, palpitations, orthopnea, leg swelling and PND.  Gastrointestinal: Negative.  Negative for abdominal pain, blood in stool, constipation, diarrhea, melena, nausea and vomiting.  Genitourinary: Negative.  Negative for dysuria, frequency, hematuria and urgency.  Musculoskeletal: Positive for joint pain (knees). Negative for back pain and myalgias.  Skin: Negative.  Negative for rash.  Neurological: Negative.  Negative for dizziness, tingling, sensory change, speech change, focal weakness, weakness and headaches.       CVA in 12/2018.  Endo/Heme/Allergies: Negative.  Does not bruise/bleed easily.  Psychiatric/Behavioral: Positive for memory loss. Negative for depression and substance abuse. The patient has insomnia (sleeps 2 hours at a time). The patient is not nervous/anxious.        Dementia since stroke  All other systems reviewed and are negative.  Performance status (ECOG):  3  Vitals There were no vitals taken for this visit.   Physical Exam  Constitutional: She is oriented to person, place, and time. She appears well-nourished.  Neurological: She is alert and oriented to person, place, and time.  Psychiatric: She has a normal mood and affect. Her behavior is normal. Judgment and thought content normal.  Nursing note reviewed.   Hospital Outpatient Visit on 02/15/2019  Component Date Value Ref Range  Status  . Specimen Description 02/15/2019    Final                   Value:URINE, CLEAN CATCH Performed at [facility], [address] [place], [place] [address]   . Special Requests 02/15/2019    Final                   Value:NONE Performed at [facility], [address], [place], [place] [address]   . Culture 02/15/2019 *  Final  Value:>=100,000 COLONIES/mL GRAM NEGATIVE RODS IDENTIFICATION AND SUSCEPTIBILITIES TO FOLLOW Performed at [facility], [place] [address] [place], [place] [address]   . Report Status 02/15/2019 PENDING   Incomplete  Appointment on 02/15/2019  Component Date Value Ref Range Status  . Sodium 02/15/2019 136  135 - 145 mmol/L Final  . Potassium 02/15/2019 4.1  3.5 - 5.1 mmol/L Final  . Chloride 02/15/2019 104  98 - 111 mmol/L Final  . CO2 02/15/2019 24  22 - 32 mmol/L Final  . Glucose, Bld 02/15/2019 70  70 - 99 mg/dL Final  . BUN 02/15/2019 19  8 - 23 mg/dL Final  . Creatinine, Ser 02/15/2019 1.18* 0.44 - 1.00 mg/dL Final  . Calcium 02/15/2019 8.2* 8.9 - 10.3 mg/dL Final  . Total Protein 02/15/2019 6.8  6.5 - 8.1 g/dL Final  . Albumin 02/15/2019 3.9  3.5 - 5.0 g/dL Final  . AST 02/15/2019 25  15 - 41 U/L Final  . ALT 02/15/2019 22  0 - 44 U/L Final  . Alkaline Phosphatase 02/15/2019 46  38 - 126 U/L Final  . Total Bilirubin 02/15/2019 0.8  0.3 - 1.2 mg/dL Final  . GFR calc non Af Amer 02/15/2019 38* >60 mL/min Final  . GFR calc Af Amer 02/15/2019 44* >60 mL/min Final  . Anion gap 02/15/2019 8  5 - 15 Final   Performed at [facility], [address], [place], [place] [address]  . Ferritin 02/15/2019 128  11 - 307 ng/mL Final   Performed at [facility], [address] [place], [place] [address]  . CEA 02/15/2019 6.7* 0.0 - 4.7 ng/mL Final   Comment: (NOTE)                             Nonsmokers          <3.9                             Smokers             <5.6 [employer]  Electrochemiluminescence Immunoassay (ECLIA) Values obtained with different assay methods or kits cannot be used interchangeably.  Results cannot be interpreted as absolute evidence of the presence or absence of malignant disease. Performed At: [facility] [place], [place] [id number] Rush Farmer MD [id number]   . WBC 02/15/2019 5.7  4.0 - 10.5 K/uL Final  . RBC 02/15/2019 4.24  3.87 - 5.11 MIL/uL Final  . Hemoglobin 02/15/2019 12.8  12.0 - 15.0 g/dL Final  . HCT 02/15/2019 38.8  36.0 - 46.0 % Final  . MCV 02/15/2019 91.5  80.0 - 100.0 fL Final  . MCH 02/15/2019 30.2  26.0 - 34.0 pg Final  . MCHC 02/15/2019 33.0  30.0 - 36.0 g/dL Final  . RDW 02/15/2019 14.4  11.5 - 15.5 % Final  . Platelets 02/15/2019 215  150 - 400 K/uL Final  . nRBC 02/15/2019 0.0  0.0 - 0.2 % Final  . Neutrophils Relative % 02/15/2019 71  % Final  . Neutro Abs 02/15/2019 4.1  1.7 - 7.7 K/uL Final  . Lymphocytes Relative 02/15/2019 15  % Final  . Lymphs Abs 02/15/2019 0.9  0.7 - 4.0 K/uL Final  . Monocytes Relative 02/15/2019 11  % Final  . Monocytes Absolute 02/15/2019 0.7  0.1 - 1.0 K/uL Final  . Eosinophils Relative 02/15/2019 2  %  Final  . Eosinophils Absolute 02/15/2019 0.1  0.0 - 0.5 K/uL Final  . Basophils Relative 02/15/2019 0  % Final  . Basophils Absolute 02/15/2019 0.0  0.0 - 0.1 K/uL Final  . Immature Granulocytes 02/15/2019 1  % Final  . Abs Immature Granulocytes 02/15/2019 0.04  0.00 - 0.07 K/uL Final   Performed at [facility], [address], [place], [place] [address]  Office Visit on 02/15/2019  Component Date Value Ref Range Status  . Scan Result 02/15/2019 >957m   Final    Assessment:  Alexis PERDUEis a 86y.o. female with stage IIIadenocarcinoma of the transverse colons/p laparoscopic assisted right hemicolectomy with ileotransverse colostomy on 06/05/2016. Pathology revealed a 3.7 cm moderately differentiated adenocarcinoma. Tumor  invaded the muscularis propria into the pericolorectal tissue. One of 28 lymph nodes were positive. Margins were negative. Pathologic stagewas T3N1a (stage III). MSI/MMR was intact.  PET scan on 05/19/2016 revealed markedly hypermetabolic lesion in the distal ascending colon extending into the proximal hepatic flexure, consistent with the known primary malignancy. There was a tiny focus of FDG uptake in the medial right liver, just minimally above the background expected mottled hepatic uptake. CT imaging suggests there may be a tiny 9 mm hypoattenuating focus at this location. While not definite, imaging features to raise concern for the possibility of metastatic disease in the liver.   PET scanon 06/26/2017 revealed no evidence of metastatic disease.  PET scan on 09/07/2018 revealed new right hepatic lobe metastasis and no additional evidence of metastatic disease.   CEAhas been followed: 6.4 on 05/07/2016, 4.8 on 06/25/2016, 5.4 on 09/10/2016, 5.6 on 12/17/2016, 5.3 on 03/18/2017, 4.9 on 06/17/2017, 5.5 on 10/21/2017, 5.3 on 02/17/2018, 7.9 on 08/18/2018, and 6.7 on 02/15/2019.  She presented withchroniciron deficiency anemiasecondary to blood loss. Invasive procedures (EGD or colonoscopy) were initially deferred secondary to her age. Guaiac cards were negative (12/2014 and 10/2015) then positive (01/2016). Diet is modest. EGDon 04/15/2016 revealed a medium size hiatal hernia, nonbleeding gastric ulcer with no stigmata of bleeding.  Oral ironis ineffective. She received 2 units PRBCson 10/11/2015 for a hematocrit of 21.1 and hemoglobin 6.4. She has received Ferahemeon several occasions: 01/11/2015, 02/01/2015, 03/15/2015, 03/22/2015, 10/16/2015, 01/09/2016, 03/05/2016, and on 05/09/2016. Oral ironwas discontinued on 04/12/2018.  Ferritinhas been monitored: 5 on 01/02/2015, 36 on 01/31/2105, 26 on 03/08/2015, 184 on 05/02/2015, 7 on 10/10/2015, 91 on 11/07/2015, 7 on  01/02/2016, 14 on 03/03/2016, 33 on 04/07/2016,12 on 05/07/2016, 21 on 06/25/2016, 14 on 07/23/2016, 14 on 09/10/2016, 24 on 12/17/2016, 13 on 03/18/2017, 33 on 06/17/2017, 37 on 10/21/2017, 100 on 02/17/2018, and 33 on 08/18/2018.  She has chronic mild renal insufficiency. Creatinine is 1.43(CrCl 22.1 ml/min)today.  She has chronic lower extremity edema(left >right). Left lower extremity duplex on 07/23/2016 revealed no evidence of DVT.  PET scan on 09/07/2018 revealed new right hepatic lobe metastasis and no additional evidence of metastatic disease. There was gallbladder sludge, right adrenal adenoma, and two large midline ventral abdominal and pelvic wall hernias containing unobstructed bowel.   [facility] with a right acute middle cerebral artery (MCA) ischemic stroke.  She underwent thrombectomy by interventional radiology on 12/27/2018.  She was switched to Eliquis on 12/31/2018.  She has had urinary retention since her hospitalization.  She has failed voiding trials.  She has a chronic indwelling Foley catheter with plan to exchange monthly.    Symptomatically, her performance status has declined since her  CVA in 12/2018.  Plan: 1.   Review labs from 02/15/2019. 2.   Stage III adenocarcinoma of the ascending colon Clinically, she is doing fair. Patient is s/p right hemicolectomy.             Initial pathology revealed 1 of 28 lymph nodes positive.             CEA was 7.9 on 08/18/2018.             PET scan on 09/07/2018 revealed an isolated metastasis in the liver.             Tumor board recommendations:   Y-90 vs no treatment.             She is not a candidate for surgical resection given her age.  She is not interested in chemotherapy.  Discuss supportive care without intervention.  3.   RTC in 3 months for MD assessment labs (CBC with diff, CMP, CEA).  I discussed the assessment and treatment  plan with the patient.  The patient was provided an opportunity to ask questions and all were answered.  The patient agreed with the plan and demonstrated an understanding of the instructions.  The patient was advised to call back if the symptoms worsen or if the condition fails to improve as anticipated.  A total of > 20 minutes of non face-to-face time was spent with the patient with greater than 50% of that time in counseling and care-coordination.    Lequita Asal, MD, PhD    02/16/2019, 4:35 PM

## 2019-02-16 ENCOUNTER — Inpatient Hospital Stay (HOSPITAL_BASED_OUTPATIENT_CLINIC_OR_DEPARTMENT_OTHER): Payer: Medicare HMO | Admitting: Hematology and Oncology

## 2019-02-16 ENCOUNTER — Other Ambulatory Visit: Payer: Medicare HMO

## 2019-02-16 ENCOUNTER — Encounter: Payer: Self-pay | Admitting: Hematology and Oncology

## 2019-02-16 DIAGNOSIS — D500 Iron deficiency anemia secondary to blood loss (chronic): Secondary | ICD-10-CM

## 2019-02-16 DIAGNOSIS — I10 Essential (primary) hypertension: Secondary | ICD-10-CM

## 2019-02-16 DIAGNOSIS — Z7189 Other specified counseling: Secondary | ICD-10-CM

## 2019-02-16 DIAGNOSIS — E785 Hyperlipidemia, unspecified: Secondary | ICD-10-CM

## 2019-02-16 DIAGNOSIS — I6601 Occlusion and stenosis of right middle cerebral artery: Secondary | ICD-10-CM

## 2019-02-16 DIAGNOSIS — K219 Gastro-esophageal reflux disease without esophagitis: Secondary | ICD-10-CM

## 2019-02-16 DIAGNOSIS — Z79899 Other long term (current) drug therapy: Secondary | ICD-10-CM

## 2019-02-16 DIAGNOSIS — C182 Malignant neoplasm of ascending colon: Secondary | ICD-10-CM

## 2019-02-16 DIAGNOSIS — Z7901 Long term (current) use of anticoagulants: Secondary | ICD-10-CM

## 2019-02-16 DIAGNOSIS — M2550 Pain in unspecified joint: Secondary | ICD-10-CM

## 2019-02-16 DIAGNOSIS — G4700 Insomnia, unspecified: Secondary | ICD-10-CM

## 2019-02-16 DIAGNOSIS — C787 Secondary malignant neoplasm of liver and intrahepatic bile duct: Secondary | ICD-10-CM

## 2019-02-16 LAB — CEA: CEA: 6.7 ng/mL — ABNORMAL HIGH (ref 0.0–4.7)

## 2019-02-16 NOTE — Progress Notes (Signed)
The patient c/o bilateral knee pain and that the patient had a stroke 12/2018 and her memories is not the same. The patient name and DOB has been verified by phone today.

## 2019-02-17 LAB — URINE CULTURE: Culture: >=100000 — AB

## 2019-02-21 ENCOUNTER — Telehealth: Payer: Self-pay

## 2019-02-21 NOTE — Telephone Encounter (Signed)
Called pt per NP Frann Rider will be ooo all this week. JM is working from home and would like her pts converted to [place] visits.   Spoke w/ pts daughter on [place]. Was able to text a invite to MyChart and set up. Agreed to [facility] visit.

## 2019-02-22 ENCOUNTER — Telehealth (INDEPENDENT_AMBULATORY_CARE_PROVIDER_SITE_OTHER): Payer: Medicare HMO | Admitting: Adult Health

## 2019-02-22 ENCOUNTER — Ambulatory Visit: Payer: Medicare HMO | Admitting: Urology

## 2019-02-22 ENCOUNTER — Encounter: Payer: Self-pay | Admitting: Adult Health

## 2019-02-22 DIAGNOSIS — I6601 Occlusion and stenosis of right middle cerebral artery: Secondary | ICD-10-CM

## 2019-02-22 DIAGNOSIS — I69319 Unspecified symptoms and signs involving cognitive functions following cerebral infarction: Secondary | ICD-10-CM

## 2019-02-22 DIAGNOSIS — E785 Hyperlipidemia, unspecified: Secondary | ICD-10-CM

## 2019-02-22 DIAGNOSIS — I63411 Cerebral infarction due to embolism of right middle cerebral artery: Secondary | ICD-10-CM | POA: Diagnosis not present

## 2019-02-22 DIAGNOSIS — I4891 Unspecified atrial fibrillation: Secondary | ICD-10-CM

## 2019-02-22 DIAGNOSIS — I10 Essential (primary) hypertension: Secondary | ICD-10-CM

## 2019-02-22 MED ORDER — MEMANTINE HCL 28 X 5 MG & 21 X 10 MG PO TABS: ORAL_TABLET | ORAL | 0 refills | Status: DC

## 2019-02-22 NOTE — Progress Notes (Signed)
[facility] [address] [place]. [place] [address] [phone]       HOSPITAL FOLLOW UP NOTE  [name] Date of Birth:  20-Dec-1917 Medical Record Number:  [id number]   Reason for Referral:  hospital stroke follow up   Virtual Visit via Video Note  I connected with Alexis Haas on 02/22/19 at  2:15 PM EST by a video enabled telemedicine application located remotely in my own home and verified that I am speaking with the correct person using two identifiers who was located at their own home accompanied by her daughter and niece.   I discussed the limitations of evaluation and management by telemedicine and the availability of in person appointments. The patient expressed understanding and agreed to proceed.    HPI: Alexis Mcfayden Guthrieis being seen today for in office hospital follow-up regarding right MCA infarct due to right M2 occlusion status post IR with TICI 2B recanalization secondary to new onset AF.  History obtained from daughter, niece, patient and chart review. Reviewed all radiology images and labs personally.  Alexis M Guthrieis a 86 y.o.femalewith history of HTN, HLD CAD found down by son presenting to [facility] on 12/26/2018 with L sided weakness, neglect and anosognosia.  CT head unremarkable but unable to administer TPA due to unknown time of onset.  CTA showed proximal R M2 occlusion therefore transferred to [facility] for possible intervention.  Upon arrival, transferred to IR for R M2 occlusion w/ TICI2b revascularization.  MRI showed a right large MCA infarct with punctate left ACA infarcts embolic pattern likely secondary to new onset AF.  Repeat MRA showed patent right MCA.  2D echo normal EF without cardiac source of embolus identified.  Previously on aspirin 81 mg daily and recommended initiating Eliquis for new onset atrial fibrillation.  COVID-19 negative.  History of HTN with hypotension on admission resolved and recommended  long-term BP goal normotensive range.  LDL 118 and recommend initiating atorvastatin 20 mg daily.  A1c 6.1.  Other stroke risk factors include advanced age and CAD status post CABG.  Other active problems include stage III colon cancer metastatic to the liver, hypokalemia, GERD on PPI and AKI.  Also found to have UTI with treatment provided during admission.  Urinary retention with placement of Foley catheter.  Residual deficits of mild dysarthria, left hemiparesis and dysphagia.  Therapies initially recommended CIR but insurance declined rehab admission therefore recommended SNF but family declined with decision on home health therapy.  She was discharged home in stable condition on 01/03/2019.   Alexis Haas is a 86 year old female who is being seen today via virtual visit for hospital follow-up accompanied by her daughter and niece who provides majority of history.  Daughter states that she has recovered greatly from a physical standpoint with only mild left hemiparesis but no residual dysphagia and only intermittent dysarthria but continues to have cognitive impairment post stroke.  She endorses possible age-related cognitive impairment prior to stroke but was living independently without difficulty.  She is currently living in her own home but daughter and niece provide 24-hour supervision.  She does become agitated at times especially when family attempts to corrector such as when she forgets that she is in her own home or does not have an appointment to go to.  Patient has also questioned where her grandmother and sister currently were who have passed away many years ago.  These symptoms do wax/wane.  She was initiated on Seroquel during hospitalization but  daughter discontinued this medication due to worsening behaviors and cognition.  Subjectively denies depression or anxiety.  She also has difficulty with insomnia which has been ongoing.  She continues on melatonin 1 mg nightly.  She continues to receive  home health therapies including ST, OT and PT.  She is currently ambulating with a rolling walker which she was using previously due to bilateral chronic knee pain.  She has continued on Eliquis for new onset atrial fibrillation and stroke prevention without bleeding or bruising.  Continues on atorvastatin 20 mg daily without myalgias.  Blood pressure monitored at home with family unable to provide a specific levels but does endorse BP stable and occasional heart rate in the 40s.  She does not currently have established cardiologist. She did have follow-up with urology and due to failure of voiding trial, Foley catheter placed and may trial repeat voiding trial next month with urology.  No further concerns at this time.    ROS:   14 system review of systems performed and negative with exception of weakness, pain, gait impairment, confusion and memory loss  PMH:  Past Medical History:  Diagnosis Date  . Anemia   . Arthritis   . Cancer ([place]) 04/15/2016   T3, N1a adenocarcinoma of the hepatic flexure. No mismatch repair gene abnormality.   . Chronic kidney disease    LABS 3/18 COMPARED WITH 1 YEAR AGO  . Coronary artery disease   . Fall 2018  . GERD (gastroesophageal reflux disease)   . Heart disease   . Hypercholesteremia   . Hypertension   . Insomnia     PSH:  Past Surgical History:  Procedure Laterality Date  . CARDIAC SURGERY  2005   bypass  . COLONOSCOPY WITH PROPOFOL N/A 04/15/2016   Procedure: COLONOSCOPY WITH PROPOFOL;  Surgeon: Lucilla Lame, MD;  Location: [facility] ENDOSCOPY;  Service: Endoscopy;  Laterality: N/A;  . CORONARY ARTERY BYPASS GRAFT  2005   x 2  . ESOPHAGOGASTRODUODENOSCOPY (EGD) WITH PROPOFOL N/A 04/15/2016   Procedure: ESOPHAGOGASTRODUODENOSCOPY (EGD) WITH PROPOFOL;  Surgeon: Lucilla Lame, MD;  Location: [facility] ENDOSCOPY;  Service: Endoscopy;  Laterality: N/A;  . IR ANGIO VERTEBRAL SEL SUBCLAVIAN INNOMINATE UNI R MOD SED  12/27/2018  . IR CT HEAD LTD  12/27/2018  . IR  PERCUTANEOUS ART THROMBECTOMY/INFUSION INTRACRANIAL INC DIAG ANGIO  12/27/2018  . LAPAROSCOPIC RIGHT COLECTOMY Right 06/05/2016   Procedure: LAPAROSCOPIC RIGHT COLECTOMY;  Surgeon: Robert Bellow, MD;  Location: [facility] ORS;  Service: General;  Laterality: Right;  . RADIOLOGY WITH ANESTHESIA N/A 12/27/2018   Procedure: CODE STROKE;  Surgeon: Radiologist, Medication, MD;  Location: [facility];  Service: Radiology;  Laterality: N/A;  . toe removal    . TONSILLECTOMY     age 86  . UMBILICAL HERNIA REPAIR  06/05/2016   Procedure: HERNIA REPAIR UMBILICAL ADULT;  Surgeon: Robert Bellow, MD;  Location: [facility] ORS;  Service: General;;    Social History:  Social History   Socioeconomic History  . Marital status: Widowed    Spouse name: Not on file  . Number of children: Not on file  . Years of education: Not on file  . Highest education level: Not on file  Occupational History  . Not on file  Tobacco Use  . Smoking status: Never Smoker  . Smokeless tobacco: Never Used  Substance and Sexual Activity  . Alcohol use: No  . Drug use: No  . Sexual activity: Not Currently    Birth control/protection: Post-menopausal  Other Topics  Concern  . Not on file  Social History Narrative  . Not on file   Social Determinants of Health   Financial Resource Strain:   . Difficulty of Paying Living Expenses: Not on file  Food Insecurity:   . Worried About Charity fundraiser in the Last Year: Not on file  . Ran Out of Food in the Last Year: Not on file  Transportation Needs:   . Lack of Transportation (Medical): Not on file  . Lack of Transportation (Non-Medical): Not on file  Physical Activity:   . Days of Exercise per Week: Not on file  . Minutes of Exercise per Session: Not on file  Stress:   . Feeling of Stress : Not on file  Social Connections:   . Frequency of Communication with Friends and Family: Not on file  . Frequency of Social Gatherings with Friends and Family: Not on file  . Attends  Religious Services: Not on file  . Active Member of Clubs or Organizations: Not on file  . Attends Archivist Meetings: Not on file  . Marital Status: Not on file  Intimate Partner Violence:   . Fear of Current or Ex-Partner: Not on file  . Emotionally Abused: Not on file  . Physically Abused: Not on file  . Sexually Abused: Not on file    Family History: No family history on file.  Medications:   Current Outpatient Medications on File Prior to Visit  Medication Sig Dispense Refill  . acetaminophen (TYLENOL) 500 MG tablet Take 500 mg by mouth daily.    Marland Kitchen amLODipine (NORVASC) 5 MG tablet Take 1 tablet (5 mg total) by mouth daily. 30 tablet 2  . apixaban (ELIQUIS) 5 MG TABS tablet Take 1 tablet (5 mg total) by mouth 2 (two) times daily. 60 tablet 2  . atorvastatin (LIPITOR) 20 MG tablet Take 1 tablet (20 mg total) by mouth daily at 6 PM. 30 tablet 2  . ferrous sulfate 325 (65 FE) MG tablet Take 325 mg by mouth daily with breakfast.    . metoprolol tartrate (LOPRESSOR) 25 MG tablet Take 1 tablet (25 mg total) by mouth 2 (two) times daily. 60 tablet 2  . omeprazole (PRILOSEC) 20 MG capsule Take 20 mg by mouth at bedtime.     Marland Kitchen QUEtiapine (SEROQUEL) 25 MG tablet Take 0.5 tablets (12.5 mg total) by mouth at bedtime. (Patient taking differently: Take 12.5 mg by mouth at bedtime as needed. ) 15 tablet 0   No current facility-administered medications on file prior to visit.    Allergies:   Allergies  Allergen Reactions  . Amoxicillin Rash    Has patient had a PCN reaction causing immediate rash, facial/tongue/throat swelling, SOB or lightheadedness with hypotension:Yes Has patient had a PCN reaction causing severe rash involving mucus membranes or skin necrosis:unsure Has patient had a PCN reaction that required hospitalization:No Has patient had a PCN reaction occurring within the last 10 years:No If all of the above answers are "NO", then may proceed with Cephalosporin use.     . Ciprofloxacin Nausea And Vomiting     Physical Exam  General: Frail pleasant elderly Caucasian female, seated, in no evident distress Head: head normocephalic and atraumatic.    Neurologic Exam Mental Status: Awake and fully alert.  Normal speech and language.  Disoriented to time but oriented to place and self. Recent and remote memory diminished. Attention span, concentration and fund of knowledge diminished.  Unable to assess cognition further due to  visit type.  Mood and affect appropriate.  Cranial Nerves: Extraocular movements full without nystagmus. Hearing diminished to voice. Facial sensation intact. Face, tongue, palate moves normally and symmetrically.  Shoulder shrug symmetric. Motor: No evidence of pronator drift on left upper and lower extremity but did have decreased left hand finger dexterity Sensory.: intact to light touch Coordination: Rapid alternating movements normal in all extremities except left hand. Finger-to-nose and heel-to-shin unable to assess due to patient having difficulty understanding directions Gait and Station: Deferred Reflexes: UTA    NIHSS  1 Modified Rankin  3 CHA2DS2-VASc 7 Age in Years: ?76 +2  Sex: Female Female +1  Hypertension History: yes +1  Diabetes Mellitus: 0 Congestive Heart Failure History: 0 Vascular Disease History: yes +1  Stroke/TIA/Thromboembolism History: yes +2 HAS-BLED 2   Diagnostic Data (Labs, Imaging, Testing)  Mr [facility] Wo Contrast 12/28/2018 1. Revascularization of the Right MCA branches since the CTA yesterday. No new or recurrent occlusion identified. 2. Extensive bilateral ICA siphon atherosclerosis with stable mild to moderate stenosis greater on the left. 3. 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   Mr Brain Wo Contrast 12/28/2018 1. Confluent infarct tracking from the  superior right temporal gyrus posteriorly through the lateral right occipital lobe. Additional infarct in the right insula, scattered in the right frontal lobe. 2. Cytotoxic edema and Heidelberg Classification 1A petechial blood. No intracranial mass effect. 3. Punctate diffusion abnormality also in the contralateral left parietal lobe. Chronic small cerebellar infarcts.   Cerebral Angio 12/27/2018 1116 RT common carotid arteriogram followed by partial revacularization of occluded RT MCA dominant inf division with x 2 passes with solitaire 73m x 40 mm x retiever device achieving a TICI 2b revascularization  CT head 12/27/2018 1116 1. Abnormal hypodensity within the right insula consistent with acute infarction change. ASPECTS 9. 2. Generalized parenchymal atrophy with chronic small vessel ischemic disease. Chronic infarcts within the bilateral cerebellum. CTA neck: Common carotid, internal carotid and vertebral arteries patent within the neck bilaterally without significant stenosis.   Ct Angio Head W Or Wo Contrast 12/27/2018 1116 1. 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 Some reconstitution of flow is seen more distally within this vessel. 2. Additional intracranial atherosclerotic stenoses, most notably as follows. Mild/moderate focal stenosis within the intracranial left vertebral artery. Moderate focal stenosis within the P2 right posterior cerebral artery. 3. 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   Ct Angio Neck W Or Wo Contrast 12/27/2018 1116 Common carotid, internal carotid and vertebral arteries patent within the neck bilaterally without significant stenosis.  Ct Cerebral Perfusion W Contrast 12/27/2018 1116 No core infarct is identified by the perfusion software. However, definite acute infarction changes are demonstrated within the right insula on concurrent noncontrast head CT. ASPECTS 9. 60 mL region of  hypoperfused parenchyma detected predominantly within the right MCA vascular territory. Reported mismatch 60 mL.   Ct Head Wo Contrast 12/27/2018 1000 Atrophy with periventricular small vessel disease. Prior small infarct in the superior right cerebellum. No acute infarct evident. No mass or hemorrhage. There are foci of arterial vascular calcification. There is mucosal thickening in several ethmoid air cells.   Dg Chest 1 View 12/29/2018 Interval extubation. No interval change in the appearance of the lungs since the prior radiograph.  12/27/2018 1130 No active disease.   Portable Chest X-ray 12/27/2018 1718 Endotracheal tube well positioned 3 cm above the carina. Cardiomegaly. Previous CABG. No active process evident otherwise.  Dg Hip Unilat W Or Wo Pelvis 2-3 Views Left 12/27/2018 1130 Negative.     ASSESSMENT: Alexis Haas is a 86 y.o. year old female presented with left-sided weakness neglect and anosogniosia on 12/26/2018 with stroke work-up revealing right MCA infarct due to right M2 occlusion status post IR with TICI 2B refill ablation secondary to new onset atrial fibrillation. Vascular risk factors include stage III colon cancer with mets to the liver, new onset atrial fibrillation, CAD status post CABG, HTN and HLD.  Residual deficits of mild left hemiparesis and cognitive impairment    PLAN:  1. Right MCA stroke: Continue Eliquis (apixaban) daily  and atorvastatin for secondary stroke prevention. Maintain strict control of hypertension with blood pressure goal below 130/90, diabetes with hemoglobin A1c goal below 6.5% and cholesterol with LDL cholesterol (bad cholesterol) goal below 70 mg/dL.  I also advised the patient to eat a healthy diet with plenty of whole grains, cereals, fruits and vegetables, exercise regularly with at least 30 minutes of continuous activity daily and maintain ideal body weight. 2. Right M2 occlusion s/p thrombectomy: Per Dr. Estanislado Pandy,  recommended follow-up 4 weeks post discharge.  Will reach out to the office to request assistance with scheduling follow-up visit 3. Atrial fibrillation: Referral placed to cardiology to establish care for ongoing monitoring and management.  Advised to continue to monitor heart rate at home with writing down numbers and will discuss possibly decreasing metoprolol dosage with cardiology once established 4. HTN: Advised to continue current treatment regimen.  Advised to continue to monitor at home along with continued follow-up with PCP for management 5. HLD: Advised to continue current treatment regimen along with continued follow-up with PCP for future prescribing and monitoring of lipid panel 6. Cognitive impairment, poststroke: Difficulty fully assessing but likely mild to moderate vascular cognitive impairment.  Discussion regarding possible improvement with ongoing use of therapy but amount of improvement is difficult to determine at this time.  After further discussion with daughter and niece, they requested initiation of Namenda for possible improvement/stability as well as benefit of behaviors.  Order placed for titration pack with eventual dosage of Namenda 10 mg twice daily.  Discussed potential adverse effects and risk versus benefit with family verbalizing understanding.  Also advised to continue speech therapy for ongoing benefit 7. Discussion also regarding possible sleep apnea with higher risk in population with atrial fibrillation and stroke.  Also discussed this may be contributing to her ongoing issues with insomnia.  Due to current cognitive impairment, will likely not tolerate any treatment or testing at this time.  We will discuss this again at follow-up visit.  Family agreed with this plan.   Follow up in 2 months or call earlier if needed   Greater than 50% of time during this 45 minute nonface-to-face visit was spent on counseling, explanation of diagnosis of right MCA stroke, long  discussion regarding residual deficits including cognitive impairment, reviewing risk factor management of new onset A. fib, HTN, CAD and HLD, planning of further management along with potential future management, and discussion with patient and family answering all questions.    Frann Rider, AGNP-BC  [facility] [address] [place], [place] [address]  Phone [phone] Fax [phone] Note: This document was prepared with digital dictation and possible smart phrase technology. Any transcriptional errors that result from this process are unintentional.

## 2019-02-23 ENCOUNTER — Other Ambulatory Visit (HOSPITAL_COMMUNITY): Payer: Self-pay | Admitting: Interventional Radiology

## 2019-02-23 DIAGNOSIS — I639 Cerebral infarction, unspecified: Secondary | ICD-10-CM

## 2019-02-23 NOTE — Progress Notes (Signed)
I agree with the above plan 

## 2019-02-24 ENCOUNTER — Encounter: Payer: Self-pay | Admitting: Adult Health

## 2019-02-24 ENCOUNTER — Other Ambulatory Visit: Payer: Self-pay

## 2019-02-24 ENCOUNTER — Encounter: Payer: Self-pay | Admitting: Cardiology

## 2019-02-24 ENCOUNTER — Ambulatory Visit: Payer: Medicare HMO | Admitting: Cardiology

## 2019-02-24 VITALS — BP 148/72 | HR 61 | Ht 64.0 in | Wt 161.0 lb

## 2019-02-24 DIAGNOSIS — I10 Essential (primary) hypertension: Secondary | ICD-10-CM | POA: Diagnosis not present

## 2019-02-24 DIAGNOSIS — I480 Paroxysmal atrial fibrillation: Secondary | ICD-10-CM | POA: Diagnosis not present

## 2019-02-24 DIAGNOSIS — E7800 Pure hypercholesterolemia, unspecified: Secondary | ICD-10-CM

## 2019-02-24 DIAGNOSIS — I2510 Atherosclerotic heart disease of native coronary artery without angina pectoris: Secondary | ICD-10-CM

## 2019-02-24 HISTORY — DX: Atherosclerotic heart disease of native coronary artery without angina pectoris: I25.10

## 2019-02-24 NOTE — Progress Notes (Signed)
Cardiology Office Consult  Note    Date:  02/24/2019   ID:  Alexis Haas, DOB 1917/07/19, MRN [id number]  PCP:  Lynnell Jude, MD  Cardiologist:  Fransico Him, MD   Chief Complaint  Patient presents with  . Atrial Fibrillation    History of Present Illness:  Alexis Haas is a 86 y.o. female who is being seen today for the evaluation of new onset atrial fibrillation at the request of Frann Rider, NP.  This is a 86yo female with a hx of UGI with gastric perforation from gastric ulcer, anemia, adenoCA of the colon, CKD, CAD s/p remote CABG in 2005, GERD, HTN, HLD and recent acute CVA in November.  She is now referred by her PCP for evaluation of new onset atrial fibrillation.    She was admitted in 12/2018 with acute right MCA CVA treated with thrombectomy and was secondary to new onset atrial fibrillation.  She was started on Eliquis CHADS2VASC score 7.  2D echo done in Nov at the time of her CVA showed normal LVF with EF 60-65% with mild AVSC.  She is now here today to establish Cardiac Care.  She is with ond of her family members who is her caregiver currently.   She denies any chest pain or pressure, SOB, DOE, PND, orthopnea, LE edema, dizziness, palpitations or syncope. Her caregive states that prior to her CVA she was completely independent and lived on her own.  Since the CVA she has had some memory issues and now requires a caregiver.  She ambulates without any problems although she sometimes will use a walker. She has only had 1 fall.  She is compliant with her meds and is tolerating meds with no SE.    Past Medical History:  Diagnosis Date  . Acute gastric ulcer without hemorrhage or perforation   . Anemia   . Arthritis   . CAD (coronary artery disease), native coronary artery 02/24/2019   S/P remote CABG in 2005  . Cancer ([place]) 04/15/2016   T3, N1a adenocarcinoma of the hepatic flexure. No mismatch repair gene abnormality.   . Chronic kidney disease    LABS 3/18  COMPARED WITH 1 YEAR AGO  . Colon cancer, ascending ([place]) 05/21/2016  . Elevated CEA 08/30/2018  . Fall 2018  . GERD (gastroesophageal reflux disease)   . Goals of care, counseling/discussion 06/17/2017  . Hypercholesteremia   . Hypertension   . Hypocalcemia 03/21/2017  . Insomnia   . Iron deficiency anemia due to chronic blood loss   . Liver metastasis ([place]) 09/17/2018  . 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 Stenosis of intestine ([place])   . Weight loss 11/07/2015    Past Surgical History:  Procedure Laterality Date  . CARDIAC SURGERY  2005   bypass  . COLONOSCOPY WITH PROPOFOL N/A 04/15/2016   Procedure: COLONOSCOPY WITH PROPOFOL;  Surgeon: Lucilla Lame, MD;  Location: [facility];  Service: Endoscopy;  Laterality: N/A;  . CORONARY ARTERY BYPASS GRAFT  2005   x 2  . ESOPHAGOGASTRODUODENOSCOPY (EGD) WITH PROPOFOL N/A 04/15/2016   Procedure: ESOPHAGOGASTRODUODENOSCOPY (EGD) WITH PROPOFOL;  Surgeon: Lucilla Lame, MD;  Location: [facility];  Service: Endoscopy;  Laterality: N/A;  . IR ANGIO VERTEBRAL SEL SUBCLAVIAN INNOMINATE UNI R MOD SED  12/27/2018  . IR CT HEAD LTD  12/27/2018  . IR PERCUTANEOUS ART THROMBECTOMY/INFUSION INTRACRANIAL INC DIAG ANGIO  12/27/2018  . LAPAROSCOPIC RIGHT COLECTOMY Right 06/05/2016   Procedure: LAPAROSCOPIC RIGHT COLECTOMY;  Surgeon:  Robert Bellow, MD;  Location: [facility];  Service: General;  Laterality: Right;  . RADIOLOGY WITH ANESTHESIA N/A 12/27/2018   Procedure: CODE STROKE;  Surgeon: Radiologist, Medication, MD;  Location: [place];  Service: Radiology;  Laterality: N/A;  . toe removal    . TONSILLECTOMY     age 51  . UMBILICAL HERNIA REPAIR  06/05/2016   Procedure: HERNIA REPAIR UMBILICAL ADULT;  Surgeon: Robert Bellow, MD;  Location: [facility];  Service: General;;    Current Medications: Current Meds  Medication Sig  . acetaminophen (TYLENOL) 500 MG tablet Take 500 mg by mouth daily.  Marland Kitchen amLODipine (NORVASC) 5 MG tablet Take 1  tablet (5 mg total) by mouth daily.  Marland Kitchen apixaban (ELIQUIS) 5 MG TABS tablet Take 1 tablet (5 mg total) by mouth 2 (two) times daily.  Marland Kitchen atorvastatin (LIPITOR) 20 MG tablet Take 1 tablet (20 mg total) by mouth daily at 6 PM.  . Calcium Carb-Cholecalciferol (CALTRATE 600+D3) 600-800 MG-UNIT TABS   . Melatonin 10 MG/ML LIQD Take 0.1 mLs by mouth at bedtime.  . metoprolol tartrate (LOPRESSOR) 25 MG tablet Take 1 tablet (25 mg total) by mouth 2 (two) times daily.  Marland Kitchen omeprazole (PRILOSEC) 20 MG capsule Take 20 mg by mouth at bedtime.     Allergies:   Amoxicillin and Ciprofloxacin   Social History   Socioeconomic History  . Marital status: Widowed    Spouse name: Not on file  . Number of children: Not on file  . Years of education: Not on file  . Highest education level: Not on file  Occupational History  . Not on file  Tobacco Use  . Smoking status: Never Smoker  . Smokeless tobacco: Never Used  Substance and Sexual Activity  . Alcohol use: No  . Drug use: No  . Sexual activity: Not Currently    Birth control/protection: Post-menopausal  Other Topics Concern  . Not on file  Social History Narrative  . Not on file   Social Determinants of Health   Financial Resource Strain:   . Difficulty of Paying Living Expenses: Not on file  Food Insecurity:   . Worried About Charity fundraiser in the Last Year: Not on file  . Ran Out of Food in the Last Year: Not on file  Transportation Needs:   . Lack of Transportation (Medical): Not on file  . Lack of Transportation (Non-Medical): Not on file  Physical Activity:   . Days of Exercise per Week: Not on file  . Minutes of Exercise per Session: Not on file  Stress:   . Feeling of Stress : Not on file  Social Connections:   . Frequency of Communication with Friends and Family: Not on file  . Frequency of Social Gatherings with Friends and Family: Not on file  . Attends Religious Services: Not on file  . Active Member of Clubs or  Organizations: Not on file  . Attends Archivist Meetings: Not on file  . Marital Status: Not on file     Family History:  The patient's family history is not on file.   ROS:   Please see the history of present illness.    ROS All other systems reviewed and are negative.  No flowsheet data found.     PHYSICAL EXAM:   VS:  BP (!) 148/72   Pulse 61   Ht '5\' 4"'  (1.626 m)   Wt 161 lb (73 kg)   BMI 27.64 kg/m  GEN: Well nourished, well developed, in no acute distress  HEENT: normal  Neck: no JVD, carotid bruits, or masses Cardiac: RRR; no murmurs, rubs, or gallops,no edema.  Intact distal pulses bilaterally.  Respiratory:  clear to auscultation bilaterally, normal work of breathing GI: soft, nontender, nondistended, + BS MS: no deformity or atrophy  Skin: warm and dry, no rash Neuro:  Alert and Oriented x 3, Strength and sensation are intact Psych: euthymic mood, full affect  Wt Readings from Last 3 Encounters:  02/24/19 161 lb (73 kg)  01/25/19 173 lb (78.5 kg)  12/27/18 170 lb (77.1 kg)      Studies/Labs Reviewed:   EKG:  EKG is ordered today.  The ekg ordered today demonstrates NSR with nonspecific T wave abnormality  Recent Labs: 12/29/2018: Magnesium 2.2 02/15/2019: ALT 22; BUN 19; Creatinine, Ser 1.18; Hemoglobin 12.8; Platelets 215; Potassium 4.1; Sodium 136   Lipid Panel    Component Value Date/Time   CHOL 186 12/28/2018 0354   TRIG 120 12/28/2018 0354   HDL 44 12/28/2018 0354   CHOLHDL 4.2 12/28/2018 0354   VLDL 24 12/28/2018 0354   LDLCALC 118 (H) 12/28/2018 0354    Additional studies/ records that were reviewed today include:  Office notes, 2D echo11/2020    ASSESSMENT:    1. PAF (paroxysmal atrial fibrillation) ([place])   2. Benign essential HTN   3. Pure hypercholesterolemia   4. Coronary artery disease involving native coronary artery of native heart without angina pectoris      PLAN:  In order of problems listed  above:  1. Paroxysmal atrial fibrillation -she was noted to be in afib at the time of her CVA but converted to NSR -she is maintaining NSR on exam today -continue Eliquis 15m BID (creatinine normal at 1.18 and weight 78kg) and Lopressor 256mBID -follow Hbg closely with hx of GI perforated ulcer in the past (Hgb 12.8 on 02/15/2019)  2.  HTN -BP controlled -continue Lopressor 2555mID and amlodipine 5mg87mily  3. HLD -LDL goal < 70 -continue atorvastatin -LDL was 118 in Nov  4.  ASCAD -s/p remote CAB in 2005 -denies any anginal sx -continue statin and BB -no ASA due to Eliquis   Medication Adjustments/Labs and Tests Ordered: Current medicines are reviewed at length with the patient today.  Concerns regarding medicines are outlined above.  Medication changes, Labs and Tests ordered today are listed in the Patient Instructions below.  Patient Instructions  Medication Instructions:  Your physician recommends that you continue on your current medications as directed. Please refer to the Current Medication list given to you today.  *If you need a refill on your cardiac medications before your next appointment, please call your pharmacy*  Follow-Up: [facility] and your [facility] needs are our priority.  As part of our continuing mission to provide you with exceptional heart care, we have created designated Provider Care Teams.  These Care Teams include your primary Cardiologist (physician) and Advanced Practice Providers (APPs -  Physician Assistants and Nurse Practitioners) who all work together to provide you with the care you need, when you need it.  Your next appointment:   6 month(s)  The format for your next appointment:   In Person  Provider:   You may see TracFransico Him or one of the following Advanced Practice Providers on your designated Care Team:    DaynMelina Copa-C  MichErmalinda Barrios-C   Other Instructions COVID-19 Vaccine Information can be found  at:  [email or web address].uk For questions related to vaccine distribution or appointments, please email [email or web address] or call [phone].        Signed, Fransico Him, MD  02/24/2019 7:39 PM    [address], [place], [place]  [address] Phone: [phone]; Fax: [phone]

## 2019-02-24 NOTE — Progress Notes (Deleted)
Cardiology Office Note:    Date:  02/24/2019   ID:  Alexis Haas, DOB 07-22-1917, MRN [id number]  PCP:  Lynnell Jude, MD  Cardiologist:  Fransico Him, MD    Referring MD: Frann Rider, NP   No chief complaint on file.   History of Present Illness:    Alexis Haas is a 86 y.o. female with a hx of ***  Past Medical History:  Diagnosis Date  . Acute gastric ulcer without hemorrhage or perforation   . Anemia   . Arthritis   . Cancer ([place]) 04/15/2016   T3, N1a adenocarcinoma of the hepatic flexure. No mismatch repair gene abnormality.   . Chronic kidney disease    LABS 3/18 COMPARED WITH 1 YEAR AGO  . Colon cancer, ascending ([place]) 05/21/2016  . Coronary artery disease   . Elevated CEA 08/30/2018  . Fall 2018  . GERD (gastroesophageal reflux disease)   . Goals of care, counseling/discussion 06/17/2017  . Heart disease   . Hypercholesteremia   . Hypertension   . Hypocalcemia 03/21/2017  . Insomnia   . Iron deficiency anemia due to chronic blood loss   . Liver metastasis ([place]) 09/17/2018  . 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 Stenosis of intestine ([place])   . Stroke (cerebrum) ([place]) 12/27/2018  . Weight loss 11/07/2015    Past Surgical History:  Procedure Laterality Date  . CARDIAC SURGERY  2005   bypass  . COLONOSCOPY WITH PROPOFOL N/A 04/15/2016   Procedure: COLONOSCOPY WITH PROPOFOL;  Surgeon: Lucilla Lame, MD;  Location: [facility];  Service: Endoscopy;  Laterality: N/A;  . CORONARY ARTERY BYPASS GRAFT  2005   x 2  . ESOPHAGOGASTRODUODENOSCOPY (EGD) WITH PROPOFOL N/A 04/15/2016   Procedure: ESOPHAGOGASTRODUODENOSCOPY (EGD) WITH PROPOFOL;  Surgeon: Lucilla Lame, MD;  Location: [facility];  Service: Endoscopy;  Laterality: N/A;  . IR ANGIO VERTEBRAL SEL SUBCLAVIAN INNOMINATE UNI R MOD SED  12/27/2018  . IR CT HEAD LTD  12/27/2018  . IR PERCUTANEOUS ART THROMBECTOMY/INFUSION INTRACRANIAL INC DIAG ANGIO  12/27/2018  . LAPAROSCOPIC RIGHT COLECTOMY  Right 06/05/2016   Procedure: LAPAROSCOPIC RIGHT COLECTOMY;  Surgeon: Robert Bellow, MD;  Location: [facility];  Service: General;  Laterality: Right;  . RADIOLOGY WITH ANESTHESIA N/A 12/27/2018   Procedure: CODE STROKE;  Surgeon: Radiologist, Medication, MD;  Location: [place];  Service: Radiology;  Laterality: N/A;  . toe removal    . TONSILLECTOMY     age 48  . UMBILICAL HERNIA REPAIR  06/05/2016   Procedure: HERNIA REPAIR UMBILICAL ADULT;  Surgeon: Robert Bellow, MD;  Location: [facility];  Service: General;;    Current Medications: No outpatient medications have been marked as taking for the 02/24/19 encounter (Appointment) with Sueanne Margarita, MD.     Allergies:   Amoxicillin and Ciprofloxacin   Social History   Socioeconomic History  . Marital status: Widowed    Spouse name: Not on file  . Number of children: Not on file  . Years of education: Not on file  . Highest education level: Not on file  Occupational History  . Not on file  Tobacco Use  . Smoking status: Never Smoker  . Smokeless tobacco: Never Used  Substance and Sexual Activity  . Alcohol use: No  . Drug use: No  . Sexual activity: Not Currently    Birth control/protection: Post-menopausal  Other Topics Concern  . Not on file  Social History Narrative  . Not on file  Social Determinants of Health   Financial Resource Strain:   . Difficulty of Paying Living Expenses: Not on file  Food Insecurity:   . Worried About Charity fundraiser in the Last Year: Not on file  . Ran Out of Food in the Last Year: Not on file  Transportation Needs:   . Lack of Transportation (Medical): Not on file  . Lack of Transportation (Non-Medical): Not on file  Physical Activity:   . Days of Exercise per Week: Not on file  . Minutes of Exercise per Session: Not on file  Stress:   . Feeling of Stress : Not on file  Social Connections:   . Frequency of Communication with Friends and Family: Not on file  . Frequency of  Social Gatherings with Friends and Family: Not on file  . Attends Religious Services: Not on file  . Active Member of Clubs or Organizations: Not on file  . Attends Archivist Meetings: Not on file  . Marital Status: Not on file     Family History: The patient's ***family history is not on file.  ROS:   Please see the history of present illness.    ROS  All other systems reviewed and negative.   EKGs/Labs/Other Studies Reviewed:    The following studies were reviewed today: ***  EKG:  EKG is *** ordered today.  The ekg ordered today demonstrates ***  Recent Labs: 12/29/2018: Magnesium 2.2 02/15/2019: ALT 22; BUN 19; Creatinine, Ser 1.18; Hemoglobin 12.8; Platelets 215; Potassium 4.1; Sodium 136   Recent Lipid Panel    Component Value Date/Time   CHOL 186 12/28/2018 0354   TRIG 120 12/28/2018 0354   HDL 44 12/28/2018 0354   CHOLHDL 4.2 12/28/2018 0354   VLDL 24 12/28/2018 0354   LDLCALC 118 (H) 12/28/2018 0354    Physical Exam:    VS:  There were no vitals taken for this visit.    Wt Readings from Last 3 Encounters:  01/25/19 173 lb (78.5 kg)  12/27/18 170 lb (77.1 kg)  09/10/18 173 lb 9.8 oz (78.8 kg)     GEN: *** Well nourished, well developed in no acute distress HEENT: Normal NECK: No JVD; No carotid bruits LYMPHATICS: No lymphadenopathy CARDIAC: ***RRR, no murmurs, rubs, gallops RESPIRATORY:  Clear to auscultation without rales, wheezing or rhonchi  ABDOMEN: Soft, non-tender, non-distended MUSCULOSKELETAL:  No edema; No deformity  SKIN: Warm and dry NEUROLOGIC:  Alert and oriented x 3 PSYCHIATRIC:  Normal affect   ASSESSMENT:    No diagnosis found. PLAN:    In order of problems listed above:  ***   Medication Adjustments/Labs and Tests Ordered: Current medicines are reviewed at length with the patient today.  Concerns regarding medicines are outlined above.  No orders of the defined types were placed in this encounter.  No orders of  the defined types were placed in this encounter.   Signed, Fransico Him, MD  02/24/2019 2:32 PM    [place]

## 2019-02-24 NOTE — Patient Instructions (Signed)
Medication Instructions:  Your physician recommends that you continue on your current medications as directed. Please refer to the Current Medication list given to you today.  *If you need a refill on your cardiac medications before your next appointment, please call your pharmacy*  Follow-Up: At [facility], you and your health needs are our priority.  As part of our continuing mission to provide you with exceptional heart care, we have created designated Provider Care Teams.  These Care Teams include your primary Cardiologist (physician) and Advanced Practice Providers (APPs -  Physician Assistants and Nurse Practitioners) who all work together to provide you with the care you need, when you need it.  Your next appointment:   6 month(s)  The format for your next appointment:   In Person  Provider:   You may see Fransico Him, MD or one of the following Advanced Practice Providers on your designated Care Team:    Melina Copa, PA-C  Ermalinda Barrios, PA-C   Other Instructions COVID-19 Vaccine Information can be found at: ShippingScam.co.uk For questions related to vaccine distribution or appointments, please email [email or web address] or call [phone].

## 2019-03-05 ENCOUNTER — Emergency Department
Admission: EM | Admit: 2019-03-05 | Discharge: 2019-03-05 | Disposition: A | Discharge status: Home / Self Care | Payer: Medicare HMO | Attending: Emergency Medicine | Admitting: Emergency Medicine

## 2019-03-05 ENCOUNTER — Other Ambulatory Visit: Payer: Self-pay

## 2019-03-05 DIAGNOSIS — N189 Chronic kidney disease, unspecified: Secondary | ICD-10-CM | POA: Insufficient documentation

## 2019-03-05 DIAGNOSIS — T83021A Displacement of indwelling urethral catheter, initial encounter: Secondary | ICD-10-CM | POA: Diagnosis present

## 2019-03-05 DIAGNOSIS — Z7901 Long term (current) use of anticoagulants: Secondary | ICD-10-CM | POA: Diagnosis not present

## 2019-03-05 DIAGNOSIS — Z951 Presence of aortocoronary bypass graft: Secondary | ICD-10-CM | POA: Insufficient documentation

## 2019-03-05 DIAGNOSIS — T83011A Breakdown (mechanical) of indwelling urethral catheter, initial encounter: Secondary | ICD-10-CM | POA: Diagnosis not present

## 2019-03-05 DIAGNOSIS — F0390 Unspecified dementia without behavioral disturbance: Secondary | ICD-10-CM | POA: Insufficient documentation

## 2019-03-05 DIAGNOSIS — I2510 Atherosclerotic heart disease of native coronary artery without angina pectoris: Secondary | ICD-10-CM | POA: Insufficient documentation

## 2019-03-05 DIAGNOSIS — Z85038 Personal history of other malignant neoplasm of large intestine: Secondary | ICD-10-CM | POA: Diagnosis not present

## 2019-03-05 DIAGNOSIS — I129 Hypertensive chronic kidney disease with stage 1 through stage 4 chronic kidney disease, or unspecified chronic kidney disease: Secondary | ICD-10-CM | POA: Insufficient documentation

## 2019-03-05 DIAGNOSIS — Y69 Unspecified misadventure during surgical and medical care: Secondary | ICD-10-CM | POA: Insufficient documentation

## 2019-03-05 LAB — CBC WITH DIFFERENTIAL/PLATELET
Abs Immature Granulocytes: 0.03 10*3/uL (ref 0.00–0.07)
Basophils Absolute: 0 10*3/uL (ref 0.0–0.1)
Basophils Relative: 0 %
Eosinophils Absolute: 0.1 10*3/uL (ref 0.0–0.5)
Eosinophils Relative: 3 %
HCT: 41.5 % (ref 36.0–46.0)
Hemoglobin: 13.4 g/dL (ref 12.0–15.0)
Immature Granulocytes: 1 %
Lymphocytes Relative: 15 %
Lymphs Abs: 0.8 10*3/uL (ref 0.7–4.0)
MCH: 29.7 pg (ref 26.0–34.0)
MCHC: 32.3 g/dL (ref 30.0–36.0)
MCV: 92 fL (ref 80.0–100.0)
Monocytes Absolute: 0.5 10*3/uL (ref 0.1–1.0)
Monocytes Relative: 10 %
Neutro Abs: 3.7 10*3/uL (ref 1.7–7.7)
Neutrophils Relative %: 71 %
Platelets: 219 10*3/uL (ref 150–400)
RBC: 4.51 MIL/uL (ref 3.87–5.11)
RDW: 13.7 % (ref 11.5–15.5)
WBC: 5.2 10*3/uL (ref 4.0–10.5)
nRBC: 0 % (ref 0.0–0.2)

## 2019-03-05 LAB — BASIC METABOLIC PANEL
Anion gap: 11 (ref 5–15)
BUN: 18 mg/dL (ref 8–23)
CO2: 26 mmol/L (ref 22–32)
Calcium: 8.9 mg/dL (ref 8.9–10.3)
Chloride: 106 mmol/L (ref 98–111)
Creatinine, Ser: 1.12 mg/dL — ABNORMAL HIGH (ref 0.44–1.00)
GFR calc Af Amer: 47 mL/min — ABNORMAL LOW (ref >60)
GFR calc non Af Amer: 41 mL/min — ABNORMAL LOW (ref >60)
Glucose, Bld: 112 mg/dL — ABNORMAL HIGH (ref 70–99)
Potassium: 3.5 mmol/L (ref 3.5–5.1)
Sodium: 143 mmol/L (ref 135–145)

## 2019-03-05 NOTE — ED Triage Notes (Signed)
Patient's granddaughter reports that patient's catheter got caught on bed and was pulled out. Patient denies pain/bleeding.

## 2019-03-05 NOTE — ED Provider Notes (Signed)
[facility] Emergency Department Provider Note  ____________________________________________   First MD Initiated Contact with Patient 03/05/19 [phone]     (approximate)  I have reviewed the triage vital signs and the nursing notes.   HISTORY  Chief Complaint Catheter problem    HPI Alexis Haas is a 86 y.o. female status post CABG, CKD, hypertension, hyperlipidemia who comes in for catheter falling out.  Unclear exactly when the catheter fell out.  Daughter noticed it around 5 PM.  Patient had a stroke a few months ago and has had some baseline dementia.  She has been at her mental baseline.  She is not had any urinary symptoms, fevers.  Catheter fell out today, one time, unclear how.  Unable to get full HPI from patient due to baseline dementia.  Patient denies any abdominal pain or any other concerns.          Past Medical History:  Diagnosis Date  . Acute gastric ulcer without hemorrhage or perforation   . Anemia   . Arthritis   . CAD (coronary artery disease), native coronary artery 02/24/2019   S/P remote CABG in 2005  . Cancer ([place]) 04/15/2016   T3, N1a adenocarcinoma of the hepatic flexure. No mismatch repair gene abnormality.   . Chronic kidney disease    LABS 3/18 COMPARED WITH 1 YEAR AGO  . Colon cancer, ascending ([place]) 05/21/2016  . Elevated CEA 08/30/2018  . Fall 2018  . GERD (gastroesophageal reflux disease)   . Goals of care, counseling/discussion 06/17/2017  . Hypercholesteremia   . Hypertension   . Hypocalcemia 03/21/2017  . Insomnia   . Iron deficiency anemia due to chronic blood loss   . Liver metastasis ([place]) 09/17/2018  . 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 Stenosis of intestine ([place])   . Weight loss 11/07/2015    Patient Active Problem List   Diagnosis Date Noted  . CAD (coronary artery disease), native coronary artery 02/24/2019  . PAF (paroxysmal atrial fibrillation) ([place]) 02/24/2019  . Stroke  (cerebrum) ([place]) 12/27/2018  . 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 Liver metastasis ([place]) 09/17/2018  . Elevated CEA 08/30/2018  . Goals of care, counseling/discussion 06/17/2017  . Hypocalcemia 03/21/2017  . Cancer of ascending colon ([place]) 06/05/2016  . Colon cancer, ascending ([place]) 05/21/2016  . Iron deficiency anemia due to chronic blood loss   . Acute gastric ulcer without hemorrhage or perforation   . Stenosis of intestine ([place])   . Weight loss 11/07/2015  . Iron deficiency anemia 01/10/2015    Past Surgical History:  Procedure Laterality Date  . CARDIAC SURGERY  2005   bypass  . COLONOSCOPY WITH PROPOFOL N/A 04/15/2016   Procedure: COLONOSCOPY WITH PROPOFOL;  Surgeon: Lucilla Lame, MD;  Location: [facility] ENDOSCOPY;  Service: Endoscopy;  Laterality: N/A;  . CORONARY ARTERY BYPASS GRAFT  2005   x 2  . ESOPHAGOGASTRODUODENOSCOPY (EGD) WITH PROPOFOL N/A 04/15/2016   Procedure: ESOPHAGOGASTRODUODENOSCOPY (EGD) WITH PROPOFOL;  Surgeon: Lucilla Lame, MD;  Location: [facility] ENDOSCOPY;  Service: Endoscopy;  Laterality: N/A;  . IR ANGIO VERTEBRAL SEL SUBCLAVIAN INNOMINATE UNI R MOD SED  12/27/2018  . IR CT HEAD LTD  12/27/2018  . IR PERCUTANEOUS ART THROMBECTOMY/INFUSION INTRACRANIAL INC DIAG ANGIO  12/27/2018  . LAPAROSCOPIC RIGHT COLECTOMY Right 06/05/2016   Procedure: LAPAROSCOPIC RIGHT COLECTOMY;  Surgeon: Robert Bellow, MD;  Location: [facility] ORS;  Service: General;  Laterality: Right;  . RADIOLOGY WITH ANESTHESIA N/A 12/27/2018   Procedure:  CODE STROKE;  Surgeon: Radiologist, Medication, MD;  Location: [place];  Service: Radiology;  Laterality: N/A;  . toe removal    . TONSILLECTOMY     age 15  . UMBILICAL HERNIA REPAIR  06/05/2016   Procedure: HERNIA REPAIR UMBILICAL ADULT;  Surgeon: Robert Bellow, MD;  Location: [facility] ORS;  Service: General;;    Prior to Admission medications   Medication Sig Start Date End Date Taking? Authorizing Provider  acetaminophen (TYLENOL)  500 MG tablet Take 500 mg by mouth daily.    [provider]  amLODipine (NORVASC) 5 MG tablet Take 1 tablet (5 mg total) by mouth daily. 01/04/19   Donzetta Starch, NP  apixaban (ELIQUIS) 5 MG TABS tablet Take 1 tablet (5 mg total) by mouth 2 (two) times daily. 01/03/19   Donzetta Starch, NP  atorvastatin (LIPITOR) 20 MG tablet Take 1 tablet (20 mg total) by mouth daily at 6 PM. 01/03/19   Donzetta Starch, NP  Calcium Carb-Cholecalciferol (CALTRATE 600+D3) 600-800 MG-UNIT TABS  02/19/19   [provider]  Melatonin 10 MG/ML LIQD Take 0.1 mLs by mouth at bedtime.    [provider]  metoprolol tartrate (LOPRESSOR) 25 MG tablet Take 1 tablet (25 mg total) by mouth 2 (two) times daily. 01/03/19   Donzetta Starch, NP  omeprazole (PRILOSEC) 20 MG capsule Take 20 mg by mouth at bedtime.     [provider]    Allergies Amoxicillin and Ciprofloxacin  No family history on file.  Social History Social History   Tobacco Use  . Smoking status: Never Smoker  . Smokeless tobacco: Never Used  Substance Use Topics  . Alcohol use: No  . Drug use: No      Review of Systems Constitutional: No fever/chills Eyes: No visual changes. ENT: No sore throat. Cardiovascular: Denies chest pain. Respiratory: Denies shortness of breath. Gastrointestinal: No abdominal pain.  No nausea, no vomiting.  No diarrhea.  No constipation. Genitourinary: Negative for dysuria.  Catheter fell out Musculoskeletal: Negative for back pain. Skin: Negative for rash. Neurological: Negative for headaches, focal weakness or numbness. All other ROS negative but obtained by the daughter given the review of systems unable to get from patient herself due to some dementia. ____________________________________________   PHYSICAL EXAM:  VITAL SIGNS: ED Triage Vitals  Enc Vitals Group     BP 03/05/19 0607 (!) 148/65     Pulse Rate 03/05/19 0607 65     Resp 03/05/19 0607 18     Temp 03/05/19  0607 97.7 F (36.5 C)     Temp Source 03/05/19 0607 Oral     SpO2 03/05/19 0607 100 %     Weight 03/05/19 0608 160 lb 15 oz (73 kg)     Height 03/05/19 0608 '5\' 5"'  (1.651 m)     Head Circumference --      Peak Flow --      Pain Score 03/05/19 0608 0     Pain Loc --      Pain Edu? --      Excl. in [place]? --     Constitutional: Alert Well appearing and in no acute distress. Eyes: Conjunctivae are normal. EOMI. Head: Atraumatic. Nose: No congestion/rhinnorhea. Mouth/Throat: Mucous membranes are moist.   Neck: No stridor. Trachea Midline. FROM Cardiovascular: Normal rate, regular rhythm. Grossly normal heart sounds.  Good peripheral circulation. Respiratory: Normal respiratory effort.  No retractions. Lungs CTAB. Gastrointestinal: Soft and nontender. No distention. No abdominal bruits.  Hernia that is at baseline for patient Musculoskeletal: No lower extremity tenderness nor edema.  No joint effusions. Neurologic:  Normal speech and language. No gross focal neurologic deficits are appreciated.  Skin:  Skin is warm, dry and intact. No rash noted. Psychiatric: Mood and affect are normal. Speech and behavior are normal. GU: Deferred   ____________________________________________   LABS (all labs ordered are listed, but only abnormal results are displayed)  Labs Reviewed  BASIC METABOLIC PANEL - Abnormal; Notable for the following components:      Result Value   Glucose, Bld 112 (*)    Creatinine, Ser 1.12 (*)    GFR calc non Af Amer 41 (*)    GFR calc Af Amer 47 (*)    All other components within normal limits  CBC WITH DIFFERENTIAL/PLATELET   ____________________________________________   INITIAL IMPRESSION / ASSESSMENT AND PLAN / ED COURSE  Alexis Haas was evaluated in Emergency Department on 03/05/2019 for the symptoms described in the history of present illness. She was evaluated in the context of the global COVID-19 pandemic, which necessitated consideration that the  patient might be at risk for infection with the SARS-CoV-2 virus that causes COVID-19. Institutional protocols and algorithms that pertain to the evaluation of patients at risk for COVID-19 are in a state of rapid change based on information released by regulatory bodies including the CDC and federal and state organizations. These policies and algorithms were followed during the patient's care in the ED.    Patient is a well-appearing 86 year old who presents with catheter falling out.  Daughter thinks that because of her Alzheimer's that she just pulled it out.  No symptoms to suggest UTI.  No fevers or elevated white count.  Will hold off on urine because it can just be colonized.  Will get some basic labs to evaluate for kidney function, electrolyte abnormalities.  No significant abdominal distention to suggest bladder rupture.  She has no blood at urethra to suggest urethral injury.  Will place catheter and continue to monitor patient.  Catheter easily placed without blood.  Labs are around baseline.  Reevaluated patient continues to have good urine output.  Will discharge patient home  I discussed the provisional nature of ED diagnosis, the treatment so far, the ongoing plan of care, follow up appointments and return precautions with the patient and any family or support people present. They expressed understanding and agreed with the plan, discharged home.   ____________________________________________   FINAL CLINICAL IMPRESSION(S) / ED DIAGNOSES   Final diagnoses:  Malfunction of Foley catheter, initial encounter ([place])      MEDICATIONS GIVEN DURING THIS VISIT:  Medications - No data to display   ED Discharge Orders    None       Note:  This document was prepared using Dragon voice recognition software and may include unintentional dictation errors.   Vanessa [place], MD 03/05/19 [phone]

## 2019-03-05 NOTE — Discharge Instructions (Addendum)
We replaced her Foley catheter and your labs are at baseline.  Return to the ER for any other concerns

## 2019-03-08 ENCOUNTER — Emergency Department
Admission: EM | Admit: 2019-03-08 | Discharge: 2019-03-08 | Disposition: A | Discharge status: Home / Self Care | Payer: Medicare HMO | Attending: Emergency Medicine | Admitting: Emergency Medicine

## 2019-03-08 ENCOUNTER — Encounter: Payer: Self-pay | Admitting: Emergency Medicine

## 2019-03-08 ENCOUNTER — Telehealth: Payer: Self-pay

## 2019-03-08 DIAGNOSIS — T839XXA Unspecified complication of genitourinary prosthetic device, implant and graft, initial encounter: Secondary | ICD-10-CM | POA: Diagnosis present

## 2019-03-08 DIAGNOSIS — Z85038 Personal history of other malignant neoplasm of large intestine: Secondary | ICD-10-CM | POA: Insufficient documentation

## 2019-03-08 DIAGNOSIS — I129 Hypertensive chronic kidney disease with stage 1 through stage 4 chronic kidney disease, or unspecified chronic kidney disease: Secondary | ICD-10-CM | POA: Diagnosis not present

## 2019-03-08 DIAGNOSIS — N189 Chronic kidney disease, unspecified: Secondary | ICD-10-CM | POA: Diagnosis not present

## 2019-03-08 DIAGNOSIS — Z79899 Other long term (current) drug therapy: Secondary | ICD-10-CM | POA: Insufficient documentation

## 2019-03-08 DIAGNOSIS — I2510 Atherosclerotic heart disease of native coronary artery without angina pectoris: Secondary | ICD-10-CM | POA: Insufficient documentation

## 2019-03-08 DIAGNOSIS — Y69 Unspecified misadventure during surgical and medical care: Secondary | ICD-10-CM | POA: Diagnosis not present

## 2019-03-08 DIAGNOSIS — Z8673 Personal history of transient ischemic attack (TIA), and cerebral infarction without residual deficits: Secondary | ICD-10-CM | POA: Diagnosis not present

## 2019-03-08 DIAGNOSIS — Z7901 Long term (current) use of anticoagulants: Secondary | ICD-10-CM | POA: Diagnosis not present

## 2019-03-08 NOTE — ED Triage Notes (Signed)
Pt with daughter, who is caregiver, who reports pt pulled urinary catheter out tonight for the 3rd time since the beginning of January. No bleeding noted. Pt has dementia at baseline.

## 2019-03-08 NOTE — Discharge Instructions (Addendum)
Return to the ER for recurrent or worsening symptoms, persistent vomiting, fever, difficulty breathing or other concerns.

## 2019-03-08 NOTE — ED Provider Notes (Signed)
[facility] Emergency Department Provider Note   ____________________________________________   First MD Initiated Contact with Patient 03/08/19 [phone]     (approximate)  I have reviewed the triage vital signs and the nursing notes.   HISTORY  Chief Complaint Urinary Retention    HPI Alexis Haas is a 86 y.o. female brought to the ED from home by her daughter with a chief complaint of Foley catheter problem.  Patient has had a indwelling Foley catheter since last fall after a stroke.  Daughter reports patient accidentally pulled out the Foley just a few days ago.  Tonight she stood up and pulled out the Foley catheter.  Denies hematuria.  Daughter denies fever, cough, shortness of breath, abdominal pain, vomiting or cloudy urine.       Past Medical History:  Diagnosis Date  . Acute gastric ulcer without hemorrhage or perforation   . Anemia   . Arthritis   . CAD (coronary artery disease), native coronary artery 02/24/2019   S/P remote CABG in 2005  . Cancer ([place]) 04/15/2016   T3, N1a adenocarcinoma of the hepatic flexure. No mismatch repair gene abnormality.   . Chronic kidney disease    LABS 3/18 COMPARED WITH 1 YEAR AGO  . Colon cancer, ascending ([place]) 05/21/2016  . Elevated CEA 08/30/2018  . Fall 2018  . GERD (gastroesophageal reflux disease)   . Goals of care, counseling/discussion 06/17/2017  . Hypercholesteremia   . Hypertension   . Hypocalcemia 03/21/2017  . Insomnia   . Iron deficiency anemia due to chronic blood loss   . Liver metastasis ([place]) 09/17/2018  . 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 Stenosis of intestine ([place])   . Weight loss 11/07/2015    Patient Active Problem List   Diagnosis Date Noted  . CAD (coronary artery disease), native coronary artery 02/24/2019  . PAF (paroxysmal atrial fibrillation) ([place]) 02/24/2019  . Stroke (cerebrum) ([place]) 12/27/2018  . 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 Liver  metastasis ([place]) 09/17/2018  . Elevated CEA 08/30/2018  . Goals of care, counseling/discussion 06/17/2017  . Hypocalcemia 03/21/2017  . Cancer of ascending colon ([place]) 06/05/2016  . Colon cancer, ascending ([place]) 05/21/2016  . Iron deficiency anemia due to chronic blood loss   . Acute gastric ulcer without hemorrhage or perforation   . Stenosis of intestine ([place])   . Weight loss 11/07/2015  . Iron deficiency anemia 01/10/2015    Past Surgical History:  Procedure Laterality Date  . CARDIAC SURGERY  2005   bypass  . COLONOSCOPY WITH PROPOFOL N/A 04/15/2016   Procedure: COLONOSCOPY WITH PROPOFOL;  Surgeon: Lucilla Lame, MD;  Location: [facility] END[facility]COPY;  Service: Endoscopy;  Laterality: N/A;  . CORONARY ARTERY BYPASS GRAFT  2005   x 2  . ESOPHAGOGASTRODUODENOSCOPY (EGD) WITH PROPOFOL N/A 04/15/2016   Procedure: ESOPHAGOGASTRODUODENOSCOPY (EGD) WITH PROPOFOL;  Surgeon: Lucilla Lame, MD;  Location: [facility] END[facility];  Service: Endoscopy;  Laterality: N/A;  . IR ANGIO VERTEBRAL SEL SUBCLAVIAN INNOMINATE UNI R MOD SED  12/27/2018  . IR CT HEAD LTD  12/27/2018  . IR PERCUTANEOUS ART THROMBECTOMY/INFUSION INTRACRANIAL INC DIAG ANGIO  12/27/2018  . LAPAROSCOPIC RIGHT COLECTOMY Right 06/05/2016   Procedure: LAPAROSCOPIC RIGHT COLECTOMY;  Surgeon: Robert Bellow, MD;  Location: [facility] ORS;  Service: General;  Laterality: Right;  . RADIOLOGY WITH ANESTHESIA N/A 12/27/2018   Procedure: CODE STROKE;  Surgeon: Radiologist, Medication, MD;  Location: [place];  Service: Radiology;  Laterality: N/A;  . toe  removal    . TONSILLECTOMY     age 72  . UMBILICAL HERNIA REPAIR  06/05/2016   Procedure: HERNIA REPAIR UMBILICAL ADULT;  Surgeon: Robert Bellow, MD;  Location: [facility] ORS;  Service: General;;    Prior to Admission medications   Medication Sig Start Date End Date Taking? Authorizing Provider  acetaminophen (TYLENOL) 500 MG tablet Take 500 mg by mouth daily.    [provider]  amLODipine (NORVASC) 5  MG tablet Take 1 tablet (5 mg total) by mouth daily. 01/04/19   Donzetta Starch, NP  apixaban (ELIQUIS) 5 MG TABS tablet Take 1 tablet (5 mg total) by mouth 2 (two) times daily. 01/03/19   Donzetta Starch, NP  atorvastatin (LIPITOR) 20 MG tablet Take 1 tablet (20 mg total) by mouth daily at 6 PM. 01/03/19   Donzetta Starch, NP  Calcium Carb-Cholecalciferol (CALTRATE 600+D3) 600-800 MG-UNIT TABS  02/19/19   [provider]  Melatonin 10 MG/ML LIQD Take 0.1 mLs by mouth at bedtime.    [provider]  metoprolol tartrate (LOPRESSOR) 25 MG tablet Take 1 tablet (25 mg total) by mouth 2 (two) times daily. 01/03/19   Donzetta Starch, NP  omeprazole (PRILOSEC) 20 MG capsule Take 20 mg by mouth at bedtime.     [provider]    Allergies Amoxicillin and Ciprofloxacin  History reviewed. No pertinent family history.  Social History Social History   Tobacco Use  . Smoking status: Never Smoker  . Smokeless tobacco: Never Used  Substance Use Topics  . Alcohol use: No  . Drug use: No    Review of Systems  Constitutional: No fever/chills Eyes: No visual changes. ENT: No sore throat. Cardiovascular: Denies chest pain. Respiratory: Denies shortness of breath. Gastrointestinal: No abdominal pain.  No nausea, no vomiting.  No diarrhea.  No constipation. Genitourinary: Positive for Foley catheter problem.  Negative for dysuria. Musculoskeletal: Negative for back pain. Skin: Negative for rash. Neurological: Negative for headaches, focal weakness or numbness.   ____________________________________________   PHYSICAL EXAM:  VITAL SIGNS: ED Triage Vitals [03/08/19 0031]  Enc Vitals Group     BP (!) 169/63     Pulse Rate 69     Resp 16     Temp 98.4 F (36.9 C)     Temp Source Oral     SpO2 98 %     Weight      Height      Head Circumference      Peak Flow      Pain Score      Pain Loc      Pain Edu?      Excl. in [place]?    Patient examined after Foley  catheter replacement: Constitutional: Alert and oriented. Well appearing and in no acute distress. Eyes: Conjunctivae are normal. PERRL. EOMI. Head: Atraumatic. Nose: No congestion/rhinnorhea. Mouth/Throat: Mucous membranes are moist.   Neck: No stridor.   Cardiovascular: Normal rate, regular rhythm. Grossly normal heart sounds.  Good peripheral circulation. Respiratory: Normal respiratory effort.  No retractions. Lungs CTAB. Gastrointestinal: Soft and nontender to light or deep palpation. No distention. No abdominal bruits. No CVA tenderness. Genitourinary: Foley catheter in place draining yellow urine which is noncloudy Musculoskeletal: No lower extremity tenderness.  1+ BLE nonpitting edema.  No joint effusions. Neurologic:  Normal speech and language. No gross focal neurologic deficits are appreciated.  Skin:  Skin is warm, dry and intact. No rash noted. Psychiatric: Mood and affect are  normal. Speech and behavior are normal.  ____________________________________________   LABS (all labs ordered are listed, but only abnormal results are displayed)  Labs Reviewed - No data to display ____________________________________________  EKG  None ____________________________________________  RADIOLOGY  ED MD interpretation: None  Official radiology report(s): No results found.  ____________________________________________   PROCEDURES  Procedure(s) performed (including Critical Care):  Procedures   ____________________________________________   INITIAL IMPRESSION / ASSESSMENT AND PLAN / ED COURSE  As part of my medical decision making, I reviewed the following data within the [place] History obtained from family, Nursing notes reviewed and incorporated, Old chart reviewed and Notes from prior ED visits     YAMEL BALE was evaluated in Emergency Department on 03/08/2019 for the symptoms described in the history of present illness. She was  evaluated in the context of the global COVID-19 pandemic, which necessitated consideration that the patient might be at risk for infection with the SARS-CoV-2 virus that causes COVID-19. Institutional protocols and algorithms that pertain to the evaluation of patients at risk for COVID-19 are in a state of rapid change based on information released by regulatory bodies including the CDC and federal and state organizations. These policies and algorithms were followed during the patient's care in the ED.    86 year old female who presents after accidentally pulling out her Foley catheter. Nursing placed a larger size, 14 [place] Foley with return of yellow urine.  No hematuria.  Daughter denies increased confusion from baseline, fever, abdominal pain, nausea or vomiting.  Do not feel checking urinalysis would be necessary at this time.  Leg bag provided to family.  Strict return precautions given.  Daughter verbalizes understanding and agrees with plan of care.      ____________________________________________   FINAL CLINICAL IMPRESSION(S) / ED DIAGNOSES  Final diagnoses:  Problem with Foley catheter, initial encounter [facility])     ED Discharge Orders    None       Note:  This document was prepared using Dragon voice recognition software and may include unintentional dictation errors.   Paulette Blanch, MD 03/08/19 [phone]

## 2019-03-08 NOTE — Telephone Encounter (Signed)
After hours nurse call follow up: Patient was seen in ED and foley was replaced, patient's monthly cath change was changed upon discharge

## 2019-03-11 ENCOUNTER — Ambulatory Visit (INDEPENDENT_AMBULATORY_CARE_PROVIDER_SITE_OTHER): Payer: Medicare HMO | Admitting: Physician Assistant

## 2019-03-11 ENCOUNTER — Other Ambulatory Visit: Payer: Self-pay

## 2019-03-11 ENCOUNTER — Encounter: Payer: Self-pay | Admitting: Physician Assistant

## 2019-03-11 VITALS — BP 153/68 | HR 58 | Ht 65.0 in | Wt 160.0 lb

## 2019-03-11 DIAGNOSIS — R310 Gross hematuria: Secondary | ICD-10-CM | POA: Diagnosis not present

## 2019-03-11 NOTE — Progress Notes (Signed)
03/11/2019 12:42 PM   Alexis Haas [birth year] [id number]  CC: Gross hematuria  HPI: Alexis Haas is a 86 y.o. female with metastatic colon cancer, CAD on Eliquis, and dementia who presents today for evaluation of gross hematuria. She is an established [facility] patient previously seen by Dr. Diamantina Providence for urinary retention which occurred during hospitalization for a right MCA stroke. She failed two voiding trials on 01/25/2019 and 02/15/2019 with plans for chronic indwelling Foley catheterization.  She has presented to the emergency department twice in the last week after having pulled out her Foley catheter.  She is accompanied today by her granddaughter, who contributes to HPI.  Granddaughter reports that she noticed dark red blood in the patient's catheter tubing yesterday that has progressively lightened in color. The catheter has continued to drain.  Foley catheter in place today draining light pink urine without clot material visible.  Catheter attached with leg strap to her right leg on significant tension.  Patient reports discomfort at the site of catheter insertion with sitting. She denies fever, chills, nausea, vomiting, and suprapubic pain. Granddaughter reports that the patient occasionally forgets that the catheter is there and attempts to remove it.  PMH: Past Medical History:  Diagnosis Date  . Acute gastric ulcer without hemorrhage or perforation   . Anemia   . Arthritis   . CAD (coronary artery disease), native coronary artery 02/24/2019   S/P remote CABG in 2005  . Cancer ([place]) 04/15/2016   T3, N1a adenocarcinoma of the hepatic flexure. No mismatch repair gene abnormality.   . Chronic kidney disease    LABS 3/18 COMPARED WITH 1 YEAR AGO  . Colon cancer, ascending ([place]) 05/21/2016  . Elevated CEA 08/30/2018  . Fall 2018  . GERD (gastroesophageal reflux disease)   . Goals of care, counseling/discussion 06/17/2017  . Hypercholesteremia   . Hypertension   . Hypocalcemia  03/21/2017  . Insomnia   . Iron deficiency anemia due to chronic blood loss   . Liver metastasis ([place]) 09/17/2018  . 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 Stenosis of intestine ([place])   . Weight loss 11/07/2015    Surgical History: Past Surgical History:  Procedure Laterality Date  . CARDIAC SURGERY  2005   bypass  . COLONOSCOPY WITH PROPOFOL N/A 04/15/2016   Procedure: COLONOSCOPY WITH PROPOFOL;  Surgeon: Lucilla Lame, MD;  Location: [facility];  Service: Endoscopy;  Laterality: N/A;  . CORONARY ARTERY BYPASS GRAFT  2005   x 2  . ESOPHAGOGASTRODUODENOSCOPY (EGD) WITH PROPOFOL N/A 04/15/2016   Procedure: ESOPHAGOGASTRODUODENOSCOPY (EGD) WITH PROPOFOL;  Surgeon: Lucilla Lame, MD;  Location: [facility];  Service: Endoscopy;  Laterality: N/A;  . IR ANGIO VERTEBRAL SEL SUBCLAVIAN INNOMINATE UNI R MOD SED  12/27/2018  . IR CT HEAD LTD  12/27/2018  . IR PERCUTANEOUS ART THROMBECTOMY/INFUSION INTRACRANIAL INC DIAG ANGIO  12/27/2018  . LAPAROSCOPIC RIGHT COLECTOMY Right 06/05/2016   Procedure: LAPAROSCOPIC RIGHT COLECTOMY;  Surgeon: Robert Bellow, MD;  Location: [facility];  Service: General;  Laterality: Right;  . RADIOLOGY WITH ANESTHESIA N/A 12/27/2018   Procedure: CODE STROKE;  Surgeon: Radiologist, Medication, MD;  Location: [facility];  Service: Radiology;  Laterality: N/A;  . toe removal    . TONSILLECTOMY     age 71  . UMBILICAL HERNIA REPAIR  06/05/2016   Procedure: HERNIA REPAIR UMBILICAL ADULT;  Surgeon: Robert Bellow, MD;  Location: [facility];  Service: General;;    Home Medications:  Allergies as of 03/11/2019  Reactions   Amoxicillin Rash   Has patient had a PCN reaction causing immediate rash, facial/tongue/throat swelling, SOB or lightheadedness with hypotension:Yes Has patient had a PCN reaction causing severe rash involving mucus membranes or skin necrosis:unsure Has patient had a PCN reaction that required hospitalization:No Has patient had a PCN  reaction occurring within the last 10 years:No If all of the above answers are "NO", then may proceed with Cephalosporin use.   Ciprofloxacin Nausea And Vomiting      Medication List       Accurate as of March 11, 2019 12:42 PM. If you have any questions, ask your nurse or doctor.        acetaminophen 500 MG tablet Commonly known as: TYLENOL Take 500 mg by mouth daily.   amLODipine 5 MG tablet Commonly known as: NORVASC Take 1 tablet (5 mg total) by mouth daily.   apixaban 5 MG Tabs tablet Commonly known as: ELIQUIS Take 1 tablet (5 mg total) by mouth 2 (two) times daily.   atorvastatin 20 MG tablet Commonly known as: LIPITOR Take 1 tablet (20 mg total) by mouth daily at 6 PM.   Caltrate 600+D3 600-800 MG-UNIT Tabs Generic drug: Calcium Carb-Cholecalciferol   Melatonin 10 MG/ML Liqd Take 0.1 mLs by mouth at bedtime.   metoprolol tartrate 25 MG tablet Commonly known as: LOPRESSOR Take 1 tablet (25 mg total) by mouth 2 (two) times daily.   omeprazole 20 MG capsule Commonly known as: PRILOSEC Take 20 mg by mouth at bedtime.       Allergies:  Allergies  Allergen Reactions  . Amoxicillin Rash    Has patient had a PCN reaction causing immediate rash, facial/tongue/throat swelling, SOB or lightheadedness with hypotension:Yes Has patient had a PCN reaction causing severe rash involving mucus membranes or skin necrosis:unsure Has patient had a PCN reaction that required hospitalization:No Has patient had a PCN reaction occurring within the last 10 years:No If all of the above answers are "NO", then may proceed with Cephalosporin use.   . Ciprofloxacin Nausea And Vomiting    Family History: No family history on file.  Social History:   reports that she has never smoked. She has never used smokeless tobacco. She reports that she does not drink alcohol or use drugs.  ROS: UROLOGY Frequent Urination?: No Hard to postpone urination?: No Burning/pain with  urination?: No Get up at night to urinate?: No Leakage of urine?: No Urine stream starts and stops?: No Trouble starting stream?: No Do you have to strain to urinate?: No Blood in urine?: Yes Urinary tract infection?: No Sexually transmitted disease?: No Injury to kidneys or bladder?: No Painful intercourse?: No Weak stream?: No Currently pregnant?: No Vaginal bleeding?: No Last menstrual period?: n  Gastrointestinal Nausea?: No Vomiting?: No Indigestion/heartburn?: No Diarrhea?: No Constipation?: No  Constitutional Fever: No Night sweats?: No Weight loss?: No Fatigue?: No  Skin Skin rash/lesions?: No Itching?: No  Eyes Blurred vision?: No Double vision?: No  Ears/Nose/Throat Sore throat?: No Sinus problems?: No  Hematologic/Lymphatic Swollen glands?: No Easy bruising?: No  Cardiovascular Leg swelling?: No Chest pain?: No  Respiratory Cough?: No Shortness of breath?: No  Endocrine Excessive thirst?: No  Musculoskeletal Back pain?: No Joint pain?: No  Neurological Headaches?: No Dizziness?: No  Psychologic Depression?: No Anxiety?: No  Physical Exam: BP (!) 153/68   Pulse (!) 58   Ht 5' 5"  (1.651 m)   Wt 160 lb (72.6 kg)   BMI 26.63 kg/m   Constitutional:  Alert,  no acute distress, nontoxic appearing HEENT: [place], AT Cardiovascular: No clubbing, cyanosis, or edema Respiratory: Normal respiratory effort, no increased work of breathing GU: No suprapubic tenderness Skin: No rashes, bruises or suspicious lesions Neurologic: Grossly intact, no focal deficits, moving all 4 extremities, requires standby assist Psychiatric: Normal mood and affect  Assessment & Plan:   1. Hematuria, gross 86 year old female with catheter dependent urinary retention presents today with gross hematuria with a recent history of having pulled out her Foley catheter twice in the past week.    I suspect gross hematuria secondary to catheter irritation and urethral  trauma in the setting of recent catheter pulls.  Additionally, given tension noted on her tubing today, I suspect there is been a significant amount of inadvertent tugging on the line as well.  I exchanged her night bag for a leg bag and repositioned her leg bag to a more anterior spot on her thigh and counseled the patient and her granddaughter to ensure there is plenty of slack on the line.  Patient reports discomfort at her catheter insertion site and denies other irritative voiding symptoms.  Not concerned for infection today.  I believe she is experiencing catheter related discomfort and I explained to her and her granddaughter that there is nothing to be done about this.  I explained that she is causing urethral trauma every time she pulls out her catheter and that if she is unable to tolerate the catheter, I recommend that we remove her Foley and manage her retention with clean intermittent catheterization instead.  Granddaughter reports she newly lives with the patient and she is unsure if she wants to do this.  She will think about it and let us know.  Return if symptoms worsen or fail to improve.  Debroah Loop, PA-C  [facility] [address] [place], [place] [address] [phone]

## 2019-03-12 ENCOUNTER — Emergency Department: Payer: Medicare HMO

## 2019-03-12 ENCOUNTER — Encounter: Payer: Self-pay | Admitting: Intensive Care

## 2019-03-12 ENCOUNTER — Emergency Department
Admission: EM | Admit: 2019-03-12 | Discharge: 2019-03-12 | Disposition: A | Discharge status: Home / Self Care | Payer: Medicare HMO | Attending: Emergency Medicine | Admitting: Emergency Medicine

## 2019-03-12 ENCOUNTER — Other Ambulatory Visit: Payer: Self-pay

## 2019-03-12 DIAGNOSIS — Z85038 Personal history of other malignant neoplasm of large intestine: Secondary | ICD-10-CM | POA: Insufficient documentation

## 2019-03-12 DIAGNOSIS — Z7901 Long term (current) use of anticoagulants: Secondary | ICD-10-CM | POA: Insufficient documentation

## 2019-03-12 DIAGNOSIS — Y69 Unspecified misadventure during surgical and medical care: Secondary | ICD-10-CM | POA: Insufficient documentation

## 2019-03-12 DIAGNOSIS — I129 Hypertensive chronic kidney disease with stage 1 through stage 4 chronic kidney disease, or unspecified chronic kidney disease: Secondary | ICD-10-CM | POA: Insufficient documentation

## 2019-03-12 DIAGNOSIS — Z79899 Other long term (current) drug therapy: Secondary | ICD-10-CM | POA: Insufficient documentation

## 2019-03-12 DIAGNOSIS — R339 Retention of urine, unspecified: Secondary | ICD-10-CM | POA: Diagnosis present

## 2019-03-12 DIAGNOSIS — R7989 Other specified abnormal findings of blood chemistry: Secondary | ICD-10-CM

## 2019-03-12 DIAGNOSIS — T83511A Infection and inflammatory reaction due to indwelling urethral catheter, initial encounter: Secondary | ICD-10-CM | POA: Insufficient documentation

## 2019-03-12 DIAGNOSIS — N179 Acute kidney failure, unspecified: Secondary | ICD-10-CM

## 2019-03-12 DIAGNOSIS — Z8673 Personal history of transient ischemic attack (TIA), and cerebral infarction without residual deficits: Secondary | ICD-10-CM | POA: Diagnosis not present

## 2019-03-12 DIAGNOSIS — F0390 Unspecified dementia without behavioral disturbance: Secondary | ICD-10-CM | POA: Diagnosis not present

## 2019-03-12 DIAGNOSIS — N189 Chronic kidney disease, unspecified: Secondary | ICD-10-CM | POA: Insufficient documentation

## 2019-03-12 DIAGNOSIS — N390 Urinary tract infection, site not specified: Secondary | ICD-10-CM | POA: Insufficient documentation

## 2019-03-12 HISTORY — DX: Cerebral infarction, unspecified: I63.9

## 2019-03-12 LAB — URINALYSIS, COMPLETE (UACMP) WITH MICROSCOPIC
Bilirubin Urine: NEGATIVE
Glucose, UA: NEGATIVE mg/dL
Ketones, ur: NEGATIVE mg/dL
Nitrite: NEGATIVE
Protein, ur: 30 mg/dL — AB
Specific Gravity, Urine: 1.01 (ref 1.005–1.030)
WBC, UA: >50 WBC/hpf — ABNORMAL HIGH (ref 0–5)
pH: 6 (ref 5.0–8.0)

## 2019-03-12 LAB — COMPREHENSIVE METABOLIC PANEL
ALT: 147 U/L — ABNORMAL HIGH (ref 0–44)
AST: 115 U/L — ABNORMAL HIGH (ref 15–41)
Albumin: 3.6 g/dL (ref 3.5–5.0)
Alkaline Phosphatase: 139 U/L — ABNORMAL HIGH (ref 38–126)
Anion gap: 12 (ref 5–15)
BUN: 24 mg/dL — ABNORMAL HIGH (ref 8–23)
CO2: 24 mmol/L (ref 22–32)
Calcium: 8.6 mg/dL — ABNORMAL LOW (ref 8.9–10.3)
Chloride: 103 mmol/L (ref 98–111)
Creatinine, Ser: 1.57 mg/dL — ABNORMAL HIGH (ref 0.44–1.00)
GFR calc Af Amer: 31 mL/min — ABNORMAL LOW (ref >60)
GFR calc non Af Amer: 27 mL/min — ABNORMAL LOW (ref >60)
Glucose, Bld: 109 mg/dL — ABNORMAL HIGH (ref 70–99)
Potassium: 3.5 mmol/L (ref 3.5–5.1)
Sodium: 139 mmol/L (ref 135–145)
Total Bilirubin: 1 mg/dL (ref 0.3–1.2)
Total Protein: 6.9 g/dL (ref 6.5–8.1)

## 2019-03-12 LAB — CBC
HCT: 39.2 % (ref 36.0–46.0)
Hemoglobin: 12.7 g/dL (ref 12.0–15.0)
MCH: 30 pg (ref 26.0–34.0)
MCHC: 32.4 g/dL (ref 30.0–36.0)
MCV: 92.5 fL (ref 80.0–100.0)
Platelets: 218 10*3/uL (ref 150–400)
RBC: 4.24 MIL/uL (ref 3.87–5.11)
RDW: 13.8 % (ref 11.5–15.5)
WBC: 9.7 10*3/uL (ref 4.0–10.5)
nRBC: 0 % (ref 0.0–0.2)

## 2019-03-12 LAB — TROPONIN I (HIGH SENSITIVITY)
Troponin I (High Sensitivity): 17 ng/L (ref <18)
Troponin I (High Sensitivity): 14 ng/L (ref <18)

## 2019-03-12 LAB — LACTIC ACID, PLASMA: Lactic Acid, Venous: 2.5 mmol/L (ref 0.5–1.9)

## 2019-03-12 MED ORDER — SODIUM CHLORIDE 0.9 % IV BOLUS
500.0000 mL | Freq: Once | INTRAVENOUS | Status: AC
  Administered 2019-03-12: 500 mL via INTRAVENOUS

## 2019-03-12 MED ORDER — SODIUM CHLORIDE 0.9 % IV SOLN
1.0000 g | Freq: Once | INTRAVENOUS | Status: AC
  Administered 2019-03-12: 1 g via INTRAVENOUS
  Filled 2019-03-12: qty 10

## 2019-03-12 MED ORDER — CEPHALEXIN 500 MG PO CAPS: 500.0000 mg | ORAL_CAPSULE | Freq: Two times a day (BID) | ORAL | 0 refills | Status: AC

## 2019-03-12 MED ORDER — SODIUM CHLORIDE 0.9 % IV BOLUS: 1000.0000 mL | Freq: Once | INTRAVENOUS | Status: DC

## 2019-03-12 NOTE — ED Provider Notes (Signed)
[facility] EMERGENCY DEPARTMENT Provider Note   CSN: [id number] Arrival date & time: 03/12/19  1600     History Chief Complaint  Patient presents with  . Urinary Retention    Alexis Haas is a 86 y.o. female hx of CAD status post CABG, CKD, reflux, here presenting with weakness, Foley removal.  Patient is from home and had a Foley placed in the ED several days.  Per the daughter this morning he just slipped out and was unclear if she pulled it out before.  She did pulled out her Foley catheter multiple times recently.  She is unable to urinate since it removed.  While in triage, patient appears very weak and more altered per the daughter.  She was noted to be hypotensive with a blood pressure of 80/40.  While in the room, patient's blood pressure went up to 130/60.  Patient is demented and unable to give much history.  The history is provided by the patient and a relative.  Level V caveat- dementia      Past Medical History:  Diagnosis Date  . Acute gastric ulcer without hemorrhage or perforation   . Anemia   . Arthritis   . CAD (coronary artery disease), native coronary artery 02/24/2019   S/P remote CABG in 2005  . Cancer ([place]) 04/15/2016   T3, N1a adenocarcinoma of the hepatic flexure. No mismatch repair gene abnormality.   . Chronic kidney disease    LABS 3/18 COMPARED WITH 1 YEAR AGO  . Colon cancer, ascending ([place]) 05/21/2016  . Elevated CEA 08/30/2018  . Fall 2018  . GERD (gastroesophageal reflux disease)   . Goals of care, counseling/discussion 06/17/2017  . Hypercholesteremia   . Hypertension   . Hypocalcemia 03/21/2017  . Insomnia   . Iron deficiency anemia due to chronic blood loss   . Liver metastasis ([place]) 09/17/2018  . 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 Stenosis of intestine ([place])   . Stroke ([place])   . Weight loss 11/07/2015    Patient Active Problem List   Diagnosis Date Noted  . CAD (coronary artery disease), native  coronary artery 02/24/2019  . PAF (paroxysmal atrial fibrillation) ([place]) 02/24/2019  . Stroke (cerebrum) ([place]) 12/27/2018  . 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 Liver metastasis ([place]) 09/17/2018  . Elevated CEA 08/30/2018  . Goals of care, counseling/discussion 06/17/2017  . Hypocalcemia 03/21/2017  . Cancer of ascending colon ([place]) 06/05/2016  . Colon cancer, ascending ([place]) 05/21/2016  . Iron deficiency anemia due to chronic blood loss   . Acute gastric ulcer without hemorrhage or perforation   . Stenosis of intestine ([place])   . Weight loss 11/07/2015  . Iron deficiency anemia 01/10/2015    Past Surgical History:  Procedure Laterality Date  . CARDIAC SURGERY  2005   bypass  . COLONOSCOPY WITH PROPOFOL N/A 04/15/2016   Procedure: COLONOSCOPY WITH PROPOFOL;  Surgeon: Lucilla Lame, MD;  Location: [facility];  Service: Endoscopy;  Laterality: N/A;  . CORONARY ARTERY BYPASS GRAFT  2005   x 2  . ESOPHAGOGASTRODUODENOSCOPY (EGD) WITH PROPOFOL N/A 04/15/2016   Procedure: ESOPHAGOGASTRODUODENOSCOPY (EGD) WITH PROPOFOL;  Surgeon: Lucilla Lame, MD;  Location: [facility];  Service: Endoscopy;  Laterality: N/A;  . IR ANGIO VERTEBRAL SEL SUBCLAVIAN INNOMINATE UNI R MOD SED  12/27/2018  . IR CT HEAD LTD  12/27/2018  . IR PERCUTANEOUS ART THROMBECTOMY/INFUSION INTRACRANIAL INC DIAG ANGIO  12/27/2018  . LAPAROSCOPIC RIGHT COLECTOMY Right 06/05/2016  Procedure: LAPAROSCOPIC RIGHT COLECTOMY;  Surgeon: Robert Bellow, MD;  Location: [facility];  Service: General;  Laterality: Right;  . RADIOLOGY WITH ANESTHESIA N/A 12/27/2018   Procedure: CODE STROKE;  Surgeon: Radiologist, Medication, MD;  Location: [facility];  Service: Radiology;  Laterality: N/A;  . toe removal    . TONSILLECTOMY     age 51  . UMBILICAL HERNIA REPAIR  06/05/2016   Procedure: HERNIA REPAIR UMBILICAL ADULT;  Surgeon: Robert Bellow, MD;  Location: [facility];  Service: General;;     OB History    Gravida  4    Para  4   Term      Preterm      AB      Living        SAB      TAB      Ectopic      Multiple      Live Births           Obstetric Comments  1st Menstrual Cycle:  18 1st Pregnancy:  24         History reviewed. No pertinent family history.  Social History   Tobacco Use  . Smoking status: Never Smoker  . Smokeless tobacco: Never Used  Substance Use Topics  . Alcohol use: No  . Drug use: No    Home Medications Prior to Admission medications   Medication Sig Start Date End Date Taking? Authorizing Provider  acetaminophen (TYLENOL) 500 MG tablet Take 500 mg by mouth at bedtime.    Yes [provider]  amLODipine (NORVASC) 5 MG tablet Take 1 tablet (5 mg total) by mouth daily. 01/04/19  Yes Donzetta Starch, NP  apixaban (ELIQUIS) 5 MG TABS tablet Take 1 tablet (5 mg total) by mouth 2 (two) times daily. 01/03/19  Yes Donzetta Starch, NP  atorvastatin (LIPITOR) 20 MG tablet Take 1 tablet (20 mg total) by mouth daily at 6 PM. 01/03/19  Yes Biby, Massie Kluver, NP  Calcium Carb-Cholecalciferol (CALTRATE 600+D3) 600-800 MG-UNIT TABS Take 1 tablet by mouth daily.  02/19/19  Yes [provider]  Melatonin 10 MG/ML LIQD Take 0.1 mLs by mouth at bedtime.   Yes [provider]  metoprolol tartrate (LOPRESSOR) 25 MG tablet Take 1 tablet (25 mg total) by mouth 2 (two) times daily. 01/03/19  Yes Donzetta Starch, NP  omeprazole (PRILOSEC) 20 MG capsule Take 20 mg by mouth at bedtime.    Yes [provider]    Allergies    Amoxicillin and Ciprofloxacin  Review of Systems   Review of Systems  Gastrointestinal: Positive for abdominal distention.  All other systems reviewed and are negative.   Physical Exam Updated Vital Signs BP (!) 159/73   Pulse 60   Temp 97.7 F (36.5 C) (Oral)   Resp 20   Ht _0  (1.651 m)   Wt 72.6 kg   SpO2 98%   BMI 26.63 kg/m   Physical Exam Vitals and nursing note reviewed.  HENT:     Head: Normocephalic.      Mouth/Throat:     Mouth: Mucous membranes are moist.  Eyes:     Extraocular Movements: Extraocular movements intact.     Pupils: Pupils are equal, round, and reactive to light.  Cardiovascular:     Rate and Rhythm: Normal rate and regular rhythm.     Pulses: Normal pulses.     Heart sounds: Normal heart sounds.  Pulmonary:  Effort: Pulmonary effort is normal.     Breath sounds: Normal breath sounds.  Abdominal:     Comments: Mild suprapubic tenderness, + bladder distention   Musculoskeletal:        General: Normal range of motion.     Cervical back: Normal range of motion.  Skin:    General: Skin is warm.     Capillary Refill: Capillary refill takes less than 2 seconds.  Neurological:     General: No focal deficit present.     Mental Status: She is alert.     Comments: Demented, moving all extremities, no focal weakness   Psychiatric:        Mood and Affect: Mood normal.     ED Results / Procedures / Treatments   Labs (all labs ordered are listed, but only abnormal results are displayed) Labs Reviewed  URINALYSIS, COMPLETE (UACMP) WITH MICROSCOPIC - Abnormal; Notable for the following components:      Result Value   Color, Urine YELLOW (*)    APPearance CLOUDY (*)    Hgb urine dipstick MODERATE (*)    Protein, ur 30 (*)    Leukocytes,Ua MODERATE (*)    WBC, UA >50 (*)    Bacteria, UA FEW (*)    All other components within normal limits  COMPREHENSIVE METABOLIC PANEL - Abnormal; Notable for the following components:   Glucose, Bld 109 (*)    BUN 24 (*)    Creatinine, Ser 1.57 (*)    Calcium 8.6 (*)    AST 115 (*)    ALT 147 (*)    Alkaline Phosphatase 139 (*)    GFR calc non Af Amer 27 (*)    GFR calc Af Amer 31 (*)    All other components within normal limits  LACTIC ACID, PLASMA - Abnormal; Notable for the following components:   Lactic Acid, Venous 2.5 (*)    All other components within normal limits  URINE CULTURE  CULTURE, BLOOD (ROUTINE X 2)    CULTURE, BLOOD (ROUTINE X 2)  CBC  TROPONIN I (HIGH SENSITIVITY)  TROPONIN I (HIGH SENSITIVITY)    EKG EKG Interpretation  Date/Time:  Saturday March 12 2019 16:55:57 EST Ventricular Rate:  53 PR Interval:  238 QRS Duration: 76 QT Interval:  408 QTC Calculation: 382 R Axis:   -16 Text Interpretation: Unusual P axis, possible ectopic atrial bradycardia with Premature atrial complexes Moderate voltage criteria for LVH, may be normal variant Inferior infarct , age undetermined Anterior infarct , age undetermined T wave abnormality, consider lateral ischemia Abnormal ECG No significant change since last tracing Confirmed by Wandra Arthurs ([id number]) on 03/12/2019 4:57:42 PM   Radiology CT Head Wo Contrast  Result Date: 03/12/2019 CLINICAL DATA:  Focal neuro deficit. Stroke suspected. Disorientation. EXAM: CT HEAD WITHOUT CONTRAST TECHNIQUE: Contiguous axial images were obtained from the base of the skull through the vertex without intravenous contrast. COMPARISON:  CT scan December 27, 2018.  MRI December 28, 2018 FINDINGS: Brain: No subdural, epidural, or subarachnoid hemorrhage identified. The known right temporal/occipital infarct seen on previous MRI as a bald on CT imaging with development of encephalomalacia. White matter changes remain. A lacunar infarct in the right cerebellar hemisphere is stable. A smaller lacunar infarct in the left cerebellar hemisphere is again identified. The brainstem and basal cisterns are normal. Ventricles and sulci are prominent but stable. No mass effect or midline shift. No acute infarct identified on today's study. Vascular: Calcified atherosclerosis is seen in the intracranial  carotids. Skull: Normal. Negative for fracture or focal lesion. Sinuses/Orbits: No acute finding. Other: No other abnormalities. IMPRESSION: 1. Interval evolution of the right temporal occipital infarct with encephalomalacia. No acute infarct or ischemia identified. Chronic white matter  changes. Electronically Signed   By: Dorise Bullion III M.D   On: 03/12/2019 18:45   CT Renal Stone Study  Result Date: 03/12/2019 CLINICAL DATA:  Flank pain. Acute kidney injury. EXAM: CT ABDOMEN AND PELVIS WITHOUT CONTRAST TECHNIQUE: Multidetector CT imaging of the abdomen and pelvis was performed following the standard protocol without IV contrast. COMPARISON:  None. FINDINGS: Lower chest: No acute findings. Hepatobiliary: No mass visualized on this unenhanced exam. Gallbladder is unremarkable. No evidence of biliary ductal dilatation. Pancreas: No mass or inflammatory process visualized on this unenhanced exam. Spleen:  Within normal limits in size. Adrenals/Urinary tract: No evidence of urolithiasis or hydronephrosis. Foley catheter is seen within the urinary bladder which is empty. Stomach/Bowel: Large hiatal hernia is seen. Prior right hemicolectomy. No evidence of obstruction, inflammatory process, or abnormal fluid collections. Diverticulosis is seen mainly involving the sigmoid colon, however there is no evidence of diverticulitis. Vascular/Lymphatic: No pathologically enlarged lymph nodes identified. No evidence of abdominal aortic aneurysm. Aortic atherosclerosis incidentally noted. Reproductive:  No mass or other significant abnormality. Other: 2 moderate midline ventral abdominal wall hernias are seen which contain multiple small bowel loops. No evidence of bowel obstruction or strangulation. Musculoskeletal:  No suspicious bone lesions identified. IMPRESSION: 1. No evidence of urolithiasis, hydronephrosis, or other acute findings. 2. Large hiatal hernia. 3. Two moderate ventral abdominal wall hernias containing multiple small bowel loops. No evidence of bowel obstruction or strangulation. 4. Colonic diverticulosis. No radiographic evidence of diverticulitis. Electronically Signed   By: Marlaine Hind M.D.   On: 03/12/2019 18:37    Procedures Procedures (including critical care time)     Medications Ordered in ED Medications  sodium chloride 0.9 % bolus 500 mL (0 mLs Intravenous Stopped 03/12/19 2014)  cefTRIAXone (ROCEPHIN) 1 g in sodium chloride 0.9 % 100 mL IVPB (0 g Intravenous Stopped 03/12/19 2014)    ED Course  I have reviewed the triage vital signs and the nursing notes.  Pertinent labs & imaging results that were available during my care of the patient were reviewed by me and considered in my medical decision making (see chart for details).    MDM Rules/Calculators/A&P                      Alexis Haas is a 86 y.o. female here with urinary retention.  She had a Foley placed that she may have pulled out accidentally.  Patient was briefly hypotensive in triage.  Consider sepsis from other associated UTI versus worsening dementia versus vasovagal syncope. Will get labs, lactate, cultures, CT head. Will place foley and get UA and urine culture   8:20 PM Foley placed and UA + UTI. Given rocephin in the ED. WBC is normal. Lactate mildly elevated and patient was given IVF. Trop neg x 2. Cr is 1.57 and baseline is 1.2. AST/ALT mildly elevated but CT ab/pel was unremarkable. Will dc home with keflex. Patient has urology follow up scheduled already next week    Final Clinical Impression(s) / ED Diagnoses Final diagnoses:  None    Rx / DC Orders ED Discharge Orders    None       Drenda Freeze, MD 03/12/19 2022

## 2019-03-12 NOTE — ED Triage Notes (Addendum)
Patient arrived POV with daughter who reports  her foley catheter fell out this AM when going to bathroom. Family believes catheter got tugged on and that's how it came out.  Patient was recently seen here 03/08/19 and had catheter placed from it being pulled out. Patient Alert to self and place. Disoriented to time and situation. HX stroke in November 2020

## 2019-03-12 NOTE — ED Notes (Signed)
Daughter at bedside, updated by md and this rn on plan of care. Pt is confused, cloudy yellow urine draining from bedside drainage bag. resps unlabored.

## 2019-03-12 NOTE — Discharge Instructions (Addendum)
Please keep foley in place.   Take keflex twice daily for a week for UTI   Your kidney function and liver function is slightly abnormal. Recheck with your doctor in a week   See urology next week for follow up   Return to ER if you have trouble urinating, blood in foley, vomiting, lethargy, worse weakness

## 2019-03-14 ENCOUNTER — Other Ambulatory Visit: Payer: Self-pay

## 2019-03-14 ENCOUNTER — Encounter: Payer: Medicare HMO | Admitting: Urology

## 2019-03-14 ENCOUNTER — Emergency Department
Admission: EM | Admit: 2019-03-14 | Discharge: 2019-03-14 | Disposition: A | Discharge status: Home / Self Care | Payer: Medicare HMO | Attending: Emergency Medicine | Admitting: Emergency Medicine

## 2019-03-14 ENCOUNTER — Encounter: Payer: Self-pay | Admitting: Emergency Medicine

## 2019-03-14 DIAGNOSIS — T839XXA Unspecified complication of genitourinary prosthetic device, implant and graft, initial encounter: Secondary | ICD-10-CM | POA: Diagnosis present

## 2019-03-14 DIAGNOSIS — Z79899 Other long term (current) drug therapy: Secondary | ICD-10-CM | POA: Insufficient documentation

## 2019-03-14 DIAGNOSIS — I10 Essential (primary) hypertension: Secondary | ICD-10-CM | POA: Diagnosis not present

## 2019-03-14 DIAGNOSIS — Z7901 Long term (current) use of anticoagulants: Secondary | ICD-10-CM | POA: Diagnosis not present

## 2019-03-14 DIAGNOSIS — Z8673 Personal history of transient ischemic attack (TIA), and cerebral infarction without residual deficits: Secondary | ICD-10-CM | POA: Diagnosis not present

## 2019-03-14 DIAGNOSIS — I2510 Atherosclerotic heart disease of native coronary artery without angina pectoris: Secondary | ICD-10-CM | POA: Insufficient documentation

## 2019-03-14 DIAGNOSIS — Y731 Therapeutic (nonsurgical) and rehabilitative gastroenterology and urology devices associated with adverse incidents: Secondary | ICD-10-CM | POA: Diagnosis not present

## 2019-03-14 DIAGNOSIS — Z951 Presence of aortocoronary bypass graft: Secondary | ICD-10-CM | POA: Diagnosis not present

## 2019-03-14 NOTE — Discharge Instructions (Signed)
Follow-up with urology.  Please call for appointment.  Return emergency department as needed.

## 2019-03-14 NOTE — ED Triage Notes (Signed)
First Nurse Note:  Arrives with daughter who reports patient fulled foley catheter out "again".

## 2019-03-14 NOTE — ED Triage Notes (Signed)
Pt in via POV w/ daughter, who states patient has removed foley catheter for the fourth time.  Pt with dementia at baseline; previous CVA which has altered urinary function and is reason for chronic foley catheter.  Vitals WDL, NAD noted at this time.

## 2019-03-14 NOTE — ED Provider Notes (Signed)
[facility] Emergency Department Provider Note  ____________________________________________   First MD Initiated Contact with Patient 03/14/19 1826     (approximate)  I have reviewed the triage vital signs and the nursing notes.   HISTORY  Chief Complaint Catheter Replacement    HPI Alexis Haas is a 86 y.o. female presents emergency department with her daughter.  Daughter states that the patient has a Foley catheter that fell out again.  She states she is not sure that she pulled on it.  She states this time the urine was clear in the bag but the catheter had fallen out.  Patient was seen here on Saturday and was given antibiotic for infection.  She is to follow-up with urology tomorrow.    Past Medical History:  Diagnosis Date  . Acute gastric ulcer without hemorrhage or perforation   . Anemia   . Arthritis   . CAD (coronary artery disease), native coronary artery 02/24/2019   S/P remote CABG in 2005  . Cancer ([place]) 04/15/2016   T3, N1a adenocarcinoma of the hepatic flexure. No mismatch repair gene abnormality.   . Chronic kidney disease    LABS 3/18 COMPARED WITH 1 YEAR AGO  . Colon cancer, ascending ([place]) 05/21/2016  . Elevated CEA 08/30/2018  . Fall 2018  . GERD (gastroesophageal reflux disease)   . Goals of care, counseling/discussion 06/17/2017  . Hypercholesteremia   . Hypertension   . Hypocalcemia 03/21/2017  . Insomnia   . Iron deficiency anemia due to chronic blood loss   . Liver metastasis ([place]) 09/17/2018  . 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 Stenosis of intestine ([place])   . Stroke ([place])   . Weight loss 11/07/2015    Patient Active Problem List   Diagnosis Date Noted  . CAD (coronary artery disease), native coronary artery 02/24/2019  . PAF (paroxysmal atrial fibrillation) ([place]) 02/24/2019  . Stroke (cerebrum) ([place]) 12/27/2018  . 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 Liver metastasis ([place])  09/17/2018  . Elevated CEA 08/30/2018  . Goals of care, counseling/discussion 06/17/2017  . Hypocalcemia 03/21/2017  . Cancer of ascending colon ([place]) 06/05/2016  . Colon cancer, ascending ([place]) 05/21/2016  . Iron deficiency anemia due to chronic blood loss   . Acute gastric ulcer without hemorrhage or perforation   . Stenosis of intestine ([place])   . Weight loss 11/07/2015  . Iron deficiency anemia 01/10/2015    Past Surgical History:  Procedure Laterality Date  . CARDIAC SURGERY  2005   bypass  . COLONOSCOPY WITH PROPOFOL N/A 04/15/2016   Procedure: COLONOSCOPY WITH PROPOFOL;  Surgeon: Lucilla Lame, MD;  Location: [facility] ENDOSCOPY;  Service: Endoscopy;  Laterality: N/A;  . CORONARY ARTERY BYPASS GRAFT  2005   x 2  . ESOPHAGOGASTRODUODENOSCOPY (EGD) WITH PROPOFOL N/A 04/15/2016   Procedure: ESOPHAGOGASTRODUODENOSCOPY (EGD) WITH PROPOFOL;  Surgeon: Lucilla Lame, MD;  Location: [facility] ENDOSCOPY;  Service: Endoscopy;  Laterality: N/A;  . IR ANGIO VERTEBRAL SEL SUBCLAVIAN INNOMINATE UNI R MOD SED  12/27/2018  . IR CT HEAD LTD  12/27/2018  . IR PERCUTANEOUS ART THROMBECTOMY/INFUSION INTRACRANIAL INC DIAG ANGIO  12/27/2018  . LAPAROSCOPIC RIGHT COLECTOMY Right 06/05/2016   Procedure: LAPAROSCOPIC RIGHT COLECTOMY;  Surgeon: Robert Bellow, MD;  Location: [facility] ORS;  Service: General;  Laterality: Right;  . RADIOLOGY WITH ANESTHESIA N/A 12/27/2018   Procedure: CODE STROKE;  Surgeon: Radiologist, Medication, MD;  Location: [place];  Service: Radiology;  Laterality: N/A;  . toe removal    .  TONSILLECTOMY     age 15  . UMBILICAL HERNIA REPAIR  06/05/2016   Procedure: HERNIA REPAIR UMBILICAL ADULT;  Surgeon: Robert Bellow, MD;  Location: [facility] ORS;  Service: General;;    Prior to Admission medications   Medication Sig Start Date End Date Taking? Authorizing Provider  acetaminophen (TYLENOL) 500 MG tablet Take 500 mg by mouth at bedtime.     [provider]  amLODipine (NORVASC) 5 MG tablet  Take 1 tablet (5 mg total) by mouth daily. 01/04/19   Donzetta Starch, NP  apixaban (ELIQUIS) 5 MG TABS tablet Take 1 tablet (5 mg total) by mouth 2 (two) times daily. 01/03/19   Donzetta Starch, NP  atorvastatin (LIPITOR) 20 MG tablet Take 1 tablet (20 mg total) by mouth daily at 6 PM. 01/03/19   Donzetta Starch, NP  Calcium Carb-Cholecalciferol (CALTRATE 600+D3) 600-800 MG-UNIT TABS Take 1 tablet by mouth daily.  02/19/19   [provider]  cephALEXin (KEFLEX) 500 MG capsule Take 1 capsule (500 mg total) by mouth 2 (two) times daily for 7 days. 03/12/19 03/19/19  Drenda Freeze, MD  Melatonin 10 MG/ML LIQD Take 0.1 mLs by mouth at bedtime.    [provider]  metoprolol tartrate (LOPRESSOR) 25 MG tablet Take 1 tablet (25 mg total) by mouth 2 (two) times daily. 01/03/19   Donzetta Starch, NP  omeprazole (PRILOSEC) 20 MG capsule Take 20 mg by mouth at bedtime.     [provider]    Allergies Amoxicillin and Ciprofloxacin  No family history on file.  Social History Social History   Tobacco Use  . Smoking status: Never Smoker  . Smokeless tobacco: Never Used  Substance Use Topics  . Alcohol use: No  . Drug use: No    Review of Systems  Constitutional: No fever/chills Eyes: No visual changes. Respiratory: Denies cough Cardiovascular: Denies chest pain Genitourinary: Negative for dysuria. Musculoskeletal: Negative for back pain. Skin: Negative for rash. Psychiatric: no mood changes,     ____________________________________________   PHYSICAL EXAM:  VITAL SIGNS: ED Triage Vitals  Enc Vitals Group     BP 03/14/19 1810 (!) 145/54     Pulse Rate 03/14/19 1810 62     Resp 03/14/19 1810 16     Temp 03/14/19 1810 98.3 F (36.8 C)     Temp Source 03/14/19 1810 Oral     SpO2 03/14/19 1810 99 %     Weight 03/14/19 1811 165 lb (74.8 kg)     Height 03/14/19 1811 '5\' 3"'  (1.6 m)     Head Circumference --      Peak Flow --      Pain Score --      Pain Loc  --      Pain Edu? --      Excl. in [place]? --     Constitutional: Alert and oriented. Well appearing and in no acute distress. Eyes: Conjunctivae are normal.  Head: Atraumatic. Neck:  supple no lymphadenopathy noted Cardiovascular: Normal rate, regular rhythm. Heart sounds are normal Respiratory: Normal respiratory effort.  No retractions, lungs c t a  Abd: soft nontender bs normal all 4 quad GU: deferred Musculoskeletal: FROM all extremities, warm and well perfused Neurologic:  Normal speech and language.  Skin:  Skin is warm, dry and intact. No rash noted. Psychiatric: Mood and affect are normal. Speech and behavior are normal.  ____________________________________________   LABS (all labs ordered are listed, but only abnormal results  are displayed)  Labs Reviewed - No data to display ____________________________________________   ____________________________________________  RADIOLOGY    ____________________________________________   PROCEDURES  Procedure(s) performed: Foley catheter replaced   Procedures    ____________________________________________   INITIAL IMPRESSION / ASSESSMENT AND PLAN / ED COURSE  Pertinent labs & imaging results that were available during my care of the patient were reviewed by me and considered in my medical decision making (see chart for details).   Patient is a 11-year-old female presents emergency department after her Foley "came out again".  Daughter states urine in the cath bag was clear.  She is unsure if the patient pulled out it.  She states only left her alone for a few minutes.  Explained to her we will replace the Foley but she does need follow-up with urology.  Patient understands will comply.    DA MICHELLE was evaluated in Emergency Department on 03/14/2019 for the symptoms described in the history of present illness. She was evaluated in the context of the global COVID-19 pandemic, which necessitated consideration  that the patient might be at risk for infection with the SARS-CoV-2 virus that causes COVID-19. Institutional protocols and algorithms that pertain to the evaluation of patients at risk for COVID-19 are in a state of rapid change based on information released by regulatory bodies including the CDC and federal and state organizations. These policies and algorithms were followed during the patient's care in the ED.   As part of my medical decision making, I reviewed the following data within the [place] notes reviewed and incorporated, Old chart reviewed, Notes from prior ED visits and [place] Controlled Substance Database  ____________________________________________   FINAL CLINICAL IMPRESSION(S) / ED DIAGNOSES  Final diagnoses:  Foley catheter problem, initial encounter [facility])      NEW MEDICATIONS STARTED DURING THIS VISIT:  New Prescriptions   No medications on file     Note:  This document was prepared using Dragon voice recognition software and may include unintentional dictation errors.    Versie Starks, PA-C 03/14/19 1851    Nance Pear, MD 03/14/19 [phone]

## 2019-03-15 ENCOUNTER — Encounter: Payer: Self-pay | Admitting: Adult Health

## 2019-03-15 LAB — URINE CULTURE: Culture: >=100000 — AB

## 2019-03-16 ENCOUNTER — Other Ambulatory Visit: Payer: Self-pay

## 2019-03-16 ENCOUNTER — Ambulatory Visit: Payer: Medicare HMO | Admitting: Urology

## 2019-03-16 ENCOUNTER — Other Ambulatory Visit: Payer: Self-pay | Admitting: Radiology

## 2019-03-16 ENCOUNTER — Encounter: Payer: Self-pay | Admitting: Urology

## 2019-03-16 ENCOUNTER — Other Ambulatory Visit: Payer: Self-pay | Admitting: Urology

## 2019-03-16 VITALS — BP 161/72 | HR 63 | Ht 63.0 in

## 2019-03-16 DIAGNOSIS — R339 Retention of urine, unspecified: Secondary | ICD-10-CM | POA: Diagnosis not present

## 2019-03-16 NOTE — Progress Notes (Signed)
03/16/2019 12:37 PM   [place] 01/18/1918 [id number]  Referring provider: Lynnell Jude, MD [address] [place],  [place] [address]  Chief Complaint  Patient presents with  . Urinary Retention    HPI: Alexis Haas is a 86 year old female with a history of CVA and urinary retention who presents today for follow after being seen in the ED with her granddaughter, Alexis Haas.    She presented to the ED on 03/14/2019 after her Foley catheter had fell out.   She is unaware if she pulled it out or if it just fell out.  Granddaughter states the Foley was found with the balloon intact.  Granddaughter states that the Foley has fallen out several times since it was placed in November.  Mrs. Seaborn is unaware of tugging or pain associated with the falling out of the Foley.  A Foley was replaced in the ED and she was given a leg bag.     Current NSAID/anticoagulation: Eliquis    Today, she and her granddaughter are exploring options to this issue.    PMH: Past Medical History:  Diagnosis Date  . Acute gastric ulcer without hemorrhage or perforation   . Anemia   . Arthritis   . CAD (coronary artery disease), native coronary artery 02/24/2019   S/P remote CABG in 2005  . Cancer ([place]) 04/15/2016   T3, N1a adenocarcinoma of the hepatic flexure. No mismatch repair gene abnormality.   . Chronic kidney disease    LABS 3/18 COMPARED WITH 1 YEAR AGO  . Colon cancer, ascending ([place]) 05/21/2016  . Elevated CEA 08/30/2018  . Fall 2018  . GERD (gastroesophageal reflux disease)   . Goals of care, counseling/discussion 06/17/2017  . Hypercholesteremia   . Hypertension   . Hypocalcemia 03/21/2017  . Insomnia   . Iron deficiency anemia due to chronic blood loss   . Liver metastasis ([place]) 09/17/2018  . 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 Stenosis of intestine ([place])   . Stroke ([place])   . Weight loss 11/07/2015    Surgical History: Past Surgical History:  Procedure Laterality  Date  . CARDIAC SURGERY  2005   bypass  . COLONOSCOPY WITH PROPOFOL N/A 04/15/2016   Procedure: COLONOSCOPY WITH PROPOFOL;  Surgeon: Lucilla Lame, MD;  Location: [facility] END[facility]COPY;  Service: Endoscopy;  Laterality: N/A;  . CORONARY ARTERY BYPASS GRAFT  2005   x 2  . ESOPHAGOGASTRODUODENOSCOPY (EGD) WITH PROPOFOL N/A 04/15/2016   Procedure: ESOPHAGOGASTRODUODENOSCOPY (EGD) WITH PROPOFOL;  Surgeon: Lucilla Lame, MD;  Location: [facility];  Service: Endoscopy;  Laterality: N/A;  . IR ANGIO VERTEBRAL SEL SUBCLAVIAN INNOMINATE UNI R MOD SED  12/27/2018  . IR CT HEAD LTD  12/27/2018  . IR PERCUTANEOUS ART THROMBECTOMY/INFUSION INTRACRANIAL INC DIAG ANGIO  12/27/2018  . LAPAROSCOPIC RIGHT COLECTOMY Right 06/05/2016   Procedure: LAPAROSCOPIC RIGHT COLECTOMY;  Surgeon: Robert Bellow, MD;  Location: [facility] ORS;  Service: General;  Laterality: Right;  . RADIOLOGY WITH ANESTHESIA N/A 12/27/2018   Procedure: CODE STROKE;  Surgeon: Radiologist, Medication, MD;  Location: [place];  Service: Radiology;  Laterality: N/A;  . toe removal    . TONSILLECTOMY     age 33  . UMBILICAL HERNIA REPAIR  06/05/2016   Procedure: HERNIA REPAIR UMBILICAL ADULT;  Surgeon: Robert Bellow, MD;  Location: [facility] ORS;  Service: General;;    Home Medications:  Allergies as of 03/16/2019      Reactions   Amoxicillin Rash  Has patient had a PCN reaction causing immediate rash, facial/tongue/throat swelling, SOB or lightheadedness with hypotension:Yes Has patient had a PCN reaction causing severe rash involving mucus membranes or skin necrosis:unsure Has patient had a PCN reaction that required hospitalization:No Has patient had a PCN reaction occurring within the last 10 years:No If all of the above answers are "NO", then may proceed with Cephalosporin use.   Ciprofloxacin Nausea And Vomiting      Medication List       Accurate as of March 16, 2019 12:37 PM. If you have any questions, ask your nurse or doctor.          acetaminophen 500 MG tablet Commonly known as: TYLENOL Take 500 mg by mouth at bedtime.   amLODipine 5 MG tablet Commonly known as: NORVASC Take 1 tablet (5 mg total) by mouth daily.   apixaban 5 MG Tabs tablet Commonly known as: ELIQUIS Take 1 tablet (5 mg total) by mouth 2 (two) times daily.   atorvastatin 20 MG tablet Commonly known as: LIPITOR Take 1 tablet (20 mg total) by mouth daily at 6 PM.   Caltrate 600+D3 600-800 MG-UNIT Tabs Generic drug: Calcium Carb-Cholecalciferol Take 1 tablet by mouth daily.   cephALEXin 500 MG capsule Commonly known as: KEFLEX Take 1 capsule (500 mg total) by mouth 2 (two) times daily for 7 days.   Melatonin 10 MG/ML Liqd Take 0.1 mLs by mouth at bedtime.   metoprolol tartrate 25 MG tablet Commonly known as: LOPRESSOR Take 1 tablet (25 mg total) by mouth 2 (two) times daily.   omeprazole 20 MG capsule Commonly known as: PRILOSEC Take 20 mg by mouth at bedtime.       Allergies:  Allergies  Allergen Reactions  . Amoxicillin Rash    Has patient had a PCN reaction causing immediate rash, facial/tongue/throat swelling, SOB or lightheadedness with hypotension:Yes Has patient had a PCN reaction causing severe rash involving mucus membranes or skin necrosis:unsure Has patient had a PCN reaction that required hospitalization:No Has patient had a PCN reaction occurring within the last 10 years:No If all of the above answers are "NO", then may proceed with Cephalosporin use.   . Ciprofloxacin Nausea And Vomiting    Family History: History reviewed. No pertinent family history.  Social History:  reports that she has never smoked. She has never used smokeless tobacco. She reports that she does not drink alcohol or use drugs.  ROS: UROLOGY Frequent Urination?: No Hard to postpone urination?: No Burning/pain with urination?: No Get up at night to urinate?: No Leakage of urine?: No Urine stream starts and stops?: No Trouble starting  stream?: No Do you have to strain to urinate?: No Blood in urine?: No Urinary tract infection?: No Sexually transmitted disease?: No Injury to kidneys or bladder?: No Painful intercourse?: No Weak stream?: No Currently pregnant?: No Vaginal bleeding?: No Last menstrual period?: n  Gastrointestinal Nausea?: No Vomiting?: No Indigestion/heartburn?: No Diarrhea?: No Constipation?: No  Constitutional Fever: No Night sweats?: No Weight loss?: No Fatigue?: No  Skin Skin rash/lesions?: No Itching?: No  Eyes Blurred vision?: No Double vision?: No  Ears/Nose/Throat Sore throat?: No Sinus problems?: No  Hematologic/Lymphatic Swollen glands?: No Easy bruising?: No  Cardiovascular Leg swelling?: No Chest pain?: No  Respiratory Cough?: No Shortness of breath?: No  Endocrine Excessive thirst?: No  Musculoskeletal Back pain?: No Joint pain?: No  Neurological Headaches?: No Dizziness?: No  Psychologic Depression?: Yes Anxiety?: Yes  Physical Exam: BP (!) 161/72   Pulse  63   Ht _0  (1.6 m)   BMI 29.23 kg/m   Constitutional:  Well nourished. Alert.  No acute distress. HEENT: [place] AT, mask in place.  Trachea midline, no masses. Cardiovascular: No clubbing, cyanosis, or edema. Respiratory: Normal respiratory effort, no increased work of breathing. GI: Abdomen is soft, non tender, non distended, no abdominal masses. Liver and spleen not palpable.  Umbilical hernia appreciated.  Stool sample for occult testing is not indicated.   GU: No CVA tenderness.  No bladder fullness or masses.  Foley in place draining clear yellow urine.  Skin: No rashes, bruises or suspicious lesions.  Urethral meatus appears prolapsed, but I do not know if this is due to the traumatic Foley removals or if it was present prior to Foley placement.   Lymph: No cervical or inguinal adenopathy. Neurologic: Grossly intact, no focal deficits, moving all 4 extremities. Psychiatric: Normal mood  and affect.  Laboratory Data: Lab Results  Component Value Date   WBC 9.7 03/12/2019   HGB 12.7 03/12/2019   HCT 39.2 03/12/2019   MCV 92.5 03/12/2019   PLT 218 03/12/2019    Lab Results  Component Value Date   CREATININE 1.57 (H) 03/12/2019    No results found for: PSA  No results found for: TESTOSTERONE  Lab Results  Component Value Date   HGBA1C 6.1 (H) 12/28/2018    Lab Results  Component Value Date   TSH 1.674 01/02/2016       Component Value Date/Time   CHOL 186 12/28/2018 0354   HDL 44 12/28/2018 0354   CHOLHDL 4.2 12/28/2018 0354   VLDL 24 12/28/2018 0354   LDLCALC 118 (H) 12/28/2018 0354    Lab Results  Component Value Date   AST 115 (H) 03/12/2019   Lab Results  Component Value Date   ALT 147 (H) 03/12/2019   No components found for: ALKALINEPHOPHATASE No components found for: BILIRUBINTOTAL  No results found for: ESTRADIOL  Urinalysis    Component Value Date/Time   COLORURINE YELLOW (A) 03/12/2019 1711   APPEARANCEUR CLOUDY (A) 03/12/2019 1711   LABSPEC 1.010 03/12/2019 1711   PHURINE 6.0 03/12/2019 1711   GLUCOSEU NEGATIVE 03/12/2019 1711   HGBUR MODERATE (A) 03/12/2019 1711   BILIRUBINUR NEGATIVE 03/12/2019 [place] 03/12/2019 1711   PROTEINUR 30 (A) 03/12/2019 1711   NITRITE NEGATIVE 03/12/2019 1711   LEUKOCYTESUR MODERATE (A) 03/12/2019 1711    I have reviewed the labs.   Pertinent Imaging: CLINICAL DATA:  Flank pain. Acute kidney injury.  EXAM: CT ABDOMEN AND PELVIS WITHOUT CONTRAST  TECHNIQUE: Multidetector CT imaging of the abdomen and pelvis was performed following the standard protocol without IV contrast.  COMPARISON:  None.  FINDINGS: Lower chest: No acute findings.  Hepatobiliary: No mass visualized on this unenhanced exam. Gallbladder is unremarkable. No evidence of biliary ductal dilatation.  Pancreas: No mass or inflammatory process visualized on this unenhanced  exam.  Spleen:  Within normal limits in size.  Adrenals/Urinary tract: No evidence of urolithiasis or hydronephrosis. Foley catheter is seen within the urinary bladder which is empty.  Stomach/Bowel: Large hiatal hernia is seen. Prior right hemicolectomy. No evidence of obstruction, inflammatory process, or abnormal fluid collections. Diverticulosis is seen mainly involving the sigmoid colon, however there is no evidence of diverticulitis.  Vascular/Lymphatic: No pathologically enlarged lymph nodes identified. No evidence of abdominal aortic aneurysm. Aortic atherosclerosis incidentally noted.  Reproductive:  No mass or other significant abnormality.  Other: 2 moderate midline ventral  abdominal wall hernias are seen which contain multiple small bowel loops. No evidence of bowel obstruction or strangulation.  Musculoskeletal:  No suspicious bone lesions identified.  IMPRESSION: 1. No evidence of urolithiasis, hydronephrosis, or other acute findings. 2. Large hiatal hernia. 3. Two moderate ventral abdominal wall hernias containing multiple small bowel loops. No evidence of bowel obstruction or strangulation. 4. Colonic diverticulosis. No radiographic evidence of diverticulitis.   Electronically Signed   By: Marlaine Hind M.D.   On: 03/12/2019 18:37  I have independently reviewed the films no concerning findings.   Assessment & Plan:    1. Urinary retention Per family, Foley catheter has fallen out several times without the patient's awareness.  They are unsure if she is tripping over tubing or manually removing it during a time of dementia.  Patient has no remembrance of pulling the Foley, catching the Foley or pain associated with the Foley catheter removal.  At this time we could continue with the indwelling Foley but not use the overnight bag as the tubing is longer and more prone to being snagged or tripped on, could instruct family members on CIC but may be  difficult when patient is having aggression due to dementia, we could attempt another voiding trial in a few weeks but it is unlikely it would be successful or we could place a suprapubic tube as the urethra appears to either have prolapse or suffered dilation due to Foley balloon be passed through several times.  I spoke to her daughter, Opal Sidles, by phone of these options and she and the granddaughter have decided on suprapubic tube placement at this time. We will schedule placement of the SPT by interventional radiology with serial dilations to a 16 [place] Foley.  No follow-ups on file.  These notes generated with voice recognition software. I apologize for typographical errors.  Zara Council, PA-C  [facility] [address]  [place], [place] [address] [phone]

## 2019-03-17 ENCOUNTER — Other Ambulatory Visit: Payer: Self-pay | Admitting: Radiology

## 2019-03-17 LAB — CULTURE, BLOOD (ROUTINE X 2)
Culture: NO GROWTH
Culture: NO GROWTH
Special Requests: ADEQUATE
Special Requests: ADEQUATE

## 2019-03-18 ENCOUNTER — Encounter (HOSPITAL_COMMUNITY): Payer: Self-pay

## 2019-03-18 ENCOUNTER — Ambulatory Visit (HOSPITAL_COMMUNITY)
Admission: RE | Admit: 2019-03-18 | Discharge: 2019-03-18 | Disposition: A | Discharge status: Home / Self Care | Payer: Medicare HMO | Source: Ambulatory Visit | Attending: Interventional Radiology | Admitting: Interventional Radiology

## 2019-03-18 ENCOUNTER — Other Ambulatory Visit: Payer: Self-pay

## 2019-03-18 DIAGNOSIS — Z538 Procedure and treatment not carried out for other reasons: Secondary | ICD-10-CM | POA: Insufficient documentation

## 2019-03-18 DIAGNOSIS — I639 Cerebral infarction, unspecified: Secondary | ICD-10-CM | POA: Insufficient documentation

## 2019-03-18 DIAGNOSIS — Z01812 Encounter for preprocedural laboratory examination: Secondary | ICD-10-CM | POA: Insufficient documentation

## 2019-03-18 LAB — BASIC METABOLIC PANEL
Anion gap: 11 (ref 5–15)
BUN: 24 mg/dL — ABNORMAL HIGH (ref 8–23)
CO2: 27 mmol/L (ref 22–32)
Calcium: 9.2 mg/dL (ref 8.9–10.3)
Chloride: 102 mmol/L (ref 98–111)
Creatinine, Ser: 1.33 mg/dL — ABNORMAL HIGH (ref 0.44–1.00)
GFR calc Af Amer: 38 mL/min — ABNORMAL LOW (ref >60)
GFR calc non Af Amer: 33 mL/min — ABNORMAL LOW (ref >60)
Glucose, Bld: 102 mg/dL — ABNORMAL HIGH (ref 70–99)
Potassium: 3.5 mmol/L (ref 3.5–5.1)
Sodium: 140 mmol/L (ref 135–145)

## 2019-03-18 LAB — CBC
HCT: 41.4 % (ref 36.0–46.0)
Hemoglobin: 13.4 g/dL (ref 12.0–15.0)
MCH: 30.5 pg (ref 26.0–34.0)
MCHC: 32.4 g/dL (ref 30.0–36.0)
MCV: 94.3 fL (ref 80.0–100.0)
Platelets: 293 10*3/uL (ref 150–400)
RBC: 4.39 MIL/uL (ref 3.87–5.11)
RDW: 13.5 % (ref 11.5–15.5)
WBC: 7 10*3/uL (ref 4.0–10.5)
nRBC: 0 % (ref 0.0–0.2)

## 2019-03-18 LAB — PROTIME-INR
INR: 1 (ref 0.8–1.2)
Prothrombin Time: 13.3 seconds (ref 11.4–15.2)

## 2019-03-18 MED ORDER — NITROGLYCERIN 1 MG/10 ML FOR IR/CATH LAB
INTRA_ARTERIAL | Status: AC
  Filled 2019-03-18: qty 10

## 2019-03-18 MED ORDER — VERAPAMIL HCL 2.5 MG/ML IV SOLN
INTRAVENOUS | Status: AC
  Filled 2019-03-18: qty 2

## 2019-03-18 MED ORDER — LIDOCAINE HCL 1 % IJ SOLN
INTRAMUSCULAR | Status: AC
  Filled 2019-03-18: qty 20

## 2019-03-18 MED ORDER — HEPARIN SODIUM (PORCINE) 1000 UNIT/ML IJ SOLN
INTRAMUSCULAR | Status: AC
  Filled 2019-03-18: qty 1

## 2019-03-18 MED ORDER — SODIUM CHLORIDE 0.9 % IV SOLN: Freq: Once | INTRAVENOUS | Status: DC

## 2019-03-18 NOTE — Progress Notes (Signed)
NIR.  Patient was scheduled for an image-guided diagnostic cerebral arteriogram this AM with Dr. Estanislado Pandy.  After further review of imaging and patient's case, it is recommended by Dr. Estanislado Pandy that we do not proceed with procedure today, and instead pursue conservative management including routine imaging scans to monitor for changes. Plan for CTA head/neck (with contrast) in 3 months. No plans for NIR procedure today. The above was discussed with patient and her daughter. All questions answered and concerns addressed.  Please call NIR with questions/concerns.   Bea Graff Cordie Beazley, PA-C 03/18/2019, 8:22 AM

## 2019-03-21 ENCOUNTER — Telehealth: Payer: Self-pay | Admitting: Cardiology

## 2019-03-21 ENCOUNTER — Encounter: Payer: Self-pay | Admitting: Urology

## 2019-03-21 ENCOUNTER — Telehealth: Payer: Self-pay

## 2019-03-21 ENCOUNTER — Ambulatory Visit (INDEPENDENT_AMBULATORY_CARE_PROVIDER_SITE_OTHER): Payer: Medicare HMO | Admitting: Urology

## 2019-03-21 ENCOUNTER — Other Ambulatory Visit: Payer: Self-pay

## 2019-03-21 DIAGNOSIS — R339 Retention of urine, unspecified: Secondary | ICD-10-CM | POA: Diagnosis not present

## 2019-03-21 NOTE — Telephone Encounter (Signed)
Patient's daughter called stating that her catheter has come out again. She was added on to Shannon's schedule for cath replacement

## 2019-03-21 NOTE — Telephone Encounter (Signed)
Pharmacy, can you please give recommendations for holding Eliquis for upcoming procedure?  Thank you!

## 2019-03-21 NOTE — Telephone Encounter (Signed)
   [facility] Pre-operative Risk Assessment    Request for surgical clearance:  1. What type of surgery is being performed? Place a super pubic catheter and intventional radiology for urinary retenion  2. When is this surgery scheduled? Not yet scheduled, as soon as possible  3. What type of clearance is required (medical clearance vs. Pharmacy clearance to hold med vs. Both)? both  4. Are there any medications that need to be held prior to surgery and how long? Hold eliquis 48 hours  5. Practice name and name of physician performing surgery? [facility]  6. What is your office phone number: [phone]   7.   What is your office fax number: [phone]  8.   Anesthesia type (None, local, MAC, general) ? Not known at this time   Alexis Haas 03/21/2019, 4:16 PM  _________________________________________________________________   (provider comments below)

## 2019-03-21 NOTE — Progress Notes (Signed)
Simple Catheter Placement Due to urinary retention patient is present today for a foley cath placement.  Patient was cleaned and prepped in a sterile fashion with betadine. A 16 FR foley catheter was inserted, urine return was noted  5 ml, urine was yellow in color.  The balloon was filled with 10cc of sterile water.  A leg bag was attached for drainage. Patient was given instruction on proper catheter care.  Patient tolerated well, no complications were noted   Performed by: Zara Council, PA-C and Kyra Manges, CMA

## 2019-03-22 ENCOUNTER — Encounter: Payer: Self-pay | Admitting: Pharmacist

## 2019-03-22 NOTE — Telephone Encounter (Signed)
Patient with diagnosis of afib on Eliquis for anticoagulation.    Procedure:  Place a super pubic catheter and intventional radiology for urinary retenion Date of procedure: TBD/ASAP  CHADS2-VASc score of  7 (HTN, AGE, stroke/tia x 2, CAD, AGE, female)  Recent CVA in Nov 2020.  CrCl 22 ml/min  Restart as soon as safe post-procedure due to history of stroke  Due to recent stroke, will send to MD for input

## 2019-03-22 NOTE — Telephone Encounter (Signed)
Given hx of recent CVA and high CHADS2VASC score would prefer to only hold DOAC for 24 hours

## 2019-03-22 NOTE — Telephone Encounter (Signed)
   Primary Cardiologist: Fransico Him, MD  Chart reviewed as part of pre-operative protocol coverage. Given past medical history and time since last visit, based on ACC/AHA guidelines, Alexis Haas would be at acceptable risk for the planned procedure without further cardiovascular testing.   Dr. Radford Pax has reviewed the chart. She prefers that the patient only hold Eliquis for 24 hours due to her history.   I will route this recommendation to the requesting party via Epic fax function and remove from pre-op pool.  Please call with questions.  Phill Myron. [name] DNP, ANP, AACC  03/22/2019, 1:35 PM

## 2019-03-22 NOTE — Telephone Encounter (Signed)
   Primary Cardiologist: Fransico Him, MD  Chart reviewed as part of pre-operative protocol coverage. She is being followed for PAF, CAD with CABG in 2005, hypertension, CVA, and HL. She was last seen by Dr. Radford Pax on 02/24/2019 and was stable. To follow with GI for gastric ulcer assessment.   Given past medical history and time since last visit, based on ACC/AHA guidelines, TEAUNNA MARTZALL would be at acceptable risk for the planned procedure without further cardiovascular testing.   Per Pharmacy  CHADS2-VASc score of  7 (HTN, AGE, stroke/tia x 2, CAD, AGE, female)  Recent CVA in Nov 2020.  CrCl 22 ml/min  Per office protocol, patient can hold Eliquis for 48 days prior to procedure.    Restart as soon as safe post-procedure due to history of stroke  I will route this recommendation to the requesting party via [facility] fax function and remove from pre-op pool.  Please call with questions.  Phill Myron. Casee Knepp DNP, ANP, AACC  03/22/2019, 8:51 AM

## 2019-03-22 NOTE — Telephone Encounter (Signed)
This encounter was created in error - please disregard.

## 2019-03-23 ENCOUNTER — Telehealth: Payer: Self-pay | Admitting: Urology

## 2019-03-23 ENCOUNTER — Other Ambulatory Visit: Payer: Self-pay

## 2019-03-23 NOTE — Telephone Encounter (Signed)
I have spoken with Alexis Haas, granddaughter, and she is cathing her three times daily as the catheter came out again.  We will not proceed with SPT placement at this time and continue with straight cathing three times daily to keep residuals under 400 cc.  Alexis Haas will call on Monday to report progress.  We need to contact [facility] [employer] to get catheters out to her.

## 2019-03-23 NOTE — Patient Outreach (Signed)
Telephone outreach to patient to obtain mRS was successfully completed. MRS=3   Kena Limon Care Management Assistant  

## 2019-03-23 NOTE — Telephone Encounter (Signed)
[employer] form filled out and faxed. 

## 2019-03-28 ENCOUNTER — Telehealth: Payer: Self-pay

## 2019-03-28 NOTE — Telephone Encounter (Signed)
Error

## 2019-04-01 ENCOUNTER — Other Ambulatory Visit: Payer: Self-pay

## 2019-04-01 ENCOUNTER — Ambulatory Visit: Payer: Medicare HMO | Admitting: *Deleted

## 2019-04-01 ENCOUNTER — Telehealth: Payer: Self-pay | Admitting: *Deleted

## 2019-04-01 DIAGNOSIS — Z87440 Personal history of urinary (tract) infections: Secondary | ICD-10-CM

## 2019-04-01 DIAGNOSIS — R339 Retention of urine, unspecified: Secondary | ICD-10-CM

## 2019-04-01 LAB — URINALYSIS, COMPLETE
Bilirubin, UA: NEGATIVE
Glucose, UA: NEGATIVE
Ketones, UA: NEGATIVE
Nitrite, UA: NEGATIVE
Specific Gravity, UA: 1.02 (ref 1.005–1.030)
Urobilinogen, Ur: 0.2 mg/dL (ref 0.2–1.0)
pH, UA: 7 (ref 5.0–7.5)

## 2019-04-01 LAB — MICROSCOPIC EXAMINATION: WBC, UA: >30 /hpf — AB (ref 0–5)

## 2019-04-01 NOTE — Progress Notes (Signed)
Done a cath ua

## 2019-04-01 NOTE — Telephone Encounter (Signed)
Patient's granddaughter left a message on Triage line regarding UTI symptoms. Called to speak to granddaughter, she self-caths 3 times a day and noticed foul smelling urine and dark urine. Denies fevers. Ok per Zara Council, PA to drop off a urine.

## 2019-04-01 NOTE — Addendum Note (Signed)
Addended by: Verlene Mayer A on: 04/01/2019 10:25 AM   Modules accepted: Orders

## 2019-04-02 ENCOUNTER — Other Ambulatory Visit: Payer: Self-pay | Admitting: Urology

## 2019-04-04 ENCOUNTER — Encounter: Payer: Medicare HMO | Admitting: Urology

## 2019-04-07 ENCOUNTER — Telehealth: Payer: Self-pay | Admitting: Family Medicine

## 2019-04-07 LAB — CULTURE, URINE COMPREHENSIVE

## 2019-04-07 MED ORDER — SULFAMETHOXAZOLE-TRIMETHOPRIM 800-160 MG PO TABS: 1.0000 | ORAL_TABLET | Freq: Two times a day (BID) | ORAL | 0 refills | Status: DC

## 2019-04-07 NOTE — Telephone Encounter (Signed)
Patient notified per Larene [place] patient has uti and Septra was sent to pharmacy.

## 2019-04-18 ENCOUNTER — Other Ambulatory Visit: Payer: Self-pay

## 2019-04-18 ENCOUNTER — Encounter: Payer: Self-pay | Admitting: Urology

## 2019-04-18 ENCOUNTER — Ambulatory Visit: Payer: Medicare HMO | Admitting: Urology

## 2019-04-18 VITALS — BP 129/69 | HR 80 | Ht 63.0 in | Wt 160.0 lb

## 2019-04-18 DIAGNOSIS — R339 Retention of urine, unspecified: Secondary | ICD-10-CM

## 2019-04-18 NOTE — Progress Notes (Signed)
04/18/2019 12:04 PM   [place] [id number]  Referring provider: Lynnell Jude, MD [address] [place],  [place] [address]  Chief Complaint  Patient presents with  . Urinary Retention    1 month follow up    HPI: Alexis Haas is a 86 year old female with a history of CVA and urinary retention who presents today for follow after being seen in the ED with her granddaughter, Alexis Haas.    She had issues with continuing to pull her catheter out, so her granddaughter was instructed in CIC.  Her and her granddaughter have been doing well with the in and out catheterizations.  Her granddaughter is cathing her 3-4 times daily depending on volume returns.  She is averaging 152 for 50 cc per cathing.  The patient is drinking cranberry juice and taking cranberry tablets in order to stave off any further urinary tract infections.  She does have a grade 3 cystocele, which the granddaughter easily navigates for catheterization.  Patient denies any modifying or aggravating factors.  Patient denies any gross hematuria, dysuria or suprapubic/flank pain.  Patient denies any fevers, chills, nausea or vomiting.   PMH: Past Medical History:  Diagnosis Date  . Acute gastric ulcer without hemorrhage or perforation   . Anemia   . Arthritis   . CAD (coronary artery disease), native coronary artery 02/24/2019   S/P remote CABG in 2005  . Cancer ([place]) 04/15/2016   T3, N1a adenocarcinoma of the hepatic flexure. No mismatch repair gene abnormality.   . Chronic kidney disease    LABS 3/18 COMPARED WITH 1 YEAR AGO  . Colon cancer, ascending ([place]) 05/21/2016  . Elevated CEA 08/30/2018  . Fall 2018  . GERD (gastroesophageal reflux disease)   . Goals of care, counseling/discussion 06/17/2017  . Hypercholesteremia   . Hypertension   . Hypocalcemia 03/21/2017  . Insomnia   . Iron deficiency anemia due to chronic blood loss   . Liver metastasis ([place]) 09/17/2018  . 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 Stenosis of intestine ([place])   . Stroke ([place])   . Weight loss 11/07/2015    Surgical History: Past Surgical History:  Procedure Laterality Date  . CARDIAC SURGERY  2005   bypass  . COLONOSCOPY WITH PROPOFOL N/A 04/15/2016   Procedure: COLONOSCOPY WITH PROPOFOL;  Surgeon: Lucilla Lame, MD;  Location: [facility] ENDOSCOPY;  Service: Endoscopy;  Laterality: N/A;  . CORONARY ARTERY BYPASS GRAFT  2005   x 2  . ESOPHAGOGASTRODUODENOSCOPY (EGD) WITH PROPOFOL N/A 04/15/2016   Procedure: ESOPHAGOGASTRODUODENOSCOPY (EGD) WITH PROPOFOL;  Surgeon: Lucilla Lame, MD;  Location: [facility] ENDOSCOPY;  Service: Endoscopy;  Laterality: N/A;  . IR ANGIO VERTEBRAL SEL SUBCLAVIAN INNOMINATE UNI R MOD SED  12/27/2018  . IR CT HEAD LTD  12/27/2018  . IR PERCUTANEOUS ART THROMBECTOMY/INFUSION INTRACRANIAL INC DIAG ANGIO  12/27/2018  . LAPAROSCOPIC RIGHT COLECTOMY Right 06/05/2016   Procedure: LAPAROSCOPIC RIGHT COLECTOMY;  Surgeon: Robert Bellow, MD;  Location: [facility] ORS;  Service: General;  Laterality: Right;  . RADIOLOGY WITH ANESTHESIA N/A 12/27/2018   Procedure: CODE STROKE;  Surgeon: Radiologist, Medication, MD;  Location: [facility];  Service: Radiology;  Laterality: N/A;  . toe removal    . TONSILLECTOMY     age 38  . UMBILICAL HERNIA REPAIR  06/05/2016   Procedure: HERNIA REPAIR UMBILICAL ADULT;  Surgeon: Robert Bellow, MD;  Location: [facility] ORS;  Service: General;;    Home Medications:  Allergies as  of 04/18/2019      Reactions   Amoxicillin Rash   Has patient had a PCN reaction causing immediate rash, facial/tongue/throat swelling, SOB or lightheadedness with hypotension:Yes Has patient had a PCN reaction causing severe rash involving mucus membranes or skin necrosis:unsure Has patient had a PCN reaction that required hospitalization:No Has patient had a PCN reaction occurring within the last 10 years:No If all of the above answers are "NO", then may proceed with Cephalosporin use.    Ciprofloxacin Nausea And Vomiting      Medication List       Accurate as of April 18, 2019 12:04 PM. If you have any questions, ask your nurse or doctor.        STOP taking these medications   sulfamethoxazole-trimethoprim 800-160 MG tablet Commonly known as: BACTRIM DS Stopped by: Alexis Council, PA-C     TAKE these medications   acetaminophen 500 MG tablet Commonly known as: TYLENOL Take 500 mg by mouth at bedtime.   amLODipine 5 MG tablet Commonly known as: NORVASC Take 1 tablet (5 mg total) by mouth daily.   apixaban 5 MG Tabs tablet Commonly known as: ELIQUIS Take 1 tablet (5 mg total) by mouth 2 (two) times daily.   atorvastatin 20 MG tablet Commonly known as: LIPITOR Take 1 tablet (20 mg total) by mouth daily at 6 PM.   Caltrate 600+D3 600-800 MG-UNIT Tabs Generic drug: Calcium Carb-Cholecalciferol Take 1 tablet by mouth daily.   Melatonin 10 MG/ML Liqd Take 0.1 mLs by mouth at bedtime.   metoprolol tartrate 25 MG tablet Commonly known as: LOPRESSOR Take 1 tablet (25 mg total) by mouth 2 (two) times daily.   omeprazole 20 MG capsule Commonly known as: PRILOSEC Take 20 mg by mouth at bedtime.       Allergies:  Allergies  Allergen Reactions  . Amoxicillin Rash    Has patient had a PCN reaction causing immediate rash, facial/tongue/throat swelling, SOB or lightheadedness with hypotension:Yes Has patient had a PCN reaction causing severe rash involving mucus membranes or skin necrosis:unsure Has patient had a PCN reaction that required hospitalization:No Has patient had a PCN reaction occurring within the last 10 years:No If all of the above answers are "NO", then may proceed with Cephalosporin use.   . Ciprofloxacin Nausea And Vomiting    Family History: No family history on file.  Social History:  reports that she has never smoked. She has never used smokeless tobacco. She reports that she does not drink alcohol or use drugs.  ROS: For  pertinent review of systems please refer to history of present illness  Physical Exam: BP 129/69   Pulse 80   Ht 5' 3"  (1.6 m)   Wt 160 lb (72.6 kg)   BMI 28.34 kg/m   Constitutional:  Well nourished. Alert and oriented, No acute distress. HEENT: [place] AT, mask in place.  Trachea midline, no masses. Cardiovascular: No clubbing, cyanosis, or edema. Respiratory: Normal respiratory effort, no increased work of breathing. Neurologic: Grossly intact, no focal deficits, moving all 4 extremities.  In wheelchair.  Psychiatric: Normal mood and affect.  Laboratory Data: Lab Results  Component Value Date   WBC 7.0 03/18/2019   HGB 13.4 03/18/2019   HCT 41.4 03/18/2019   MCV 94.3 03/18/2019   PLT 293 03/18/2019    Lab Results  Component Value Date   CREATININE 1.33 (H) 03/18/2019    Lab Results  Component Value Date   HGBA1C 6.1 (H) 12/28/2018    Lab  Results  Component Value Date   TSH 1.674 01/02/2016       Component Value Date/Time   CHOL 186 12/28/2018 0354   HDL 44 12/28/2018 0354   CHOLHDL 4.2 12/28/2018 0354   VLDL 24 12/28/2018 0354   LDLCALC 118 (H) 12/28/2018 0354    Lab Results  Component Value Date   AST 115 (H) 03/12/2019   Lab Results  Component Value Date   ALT 147 (H) 03/12/2019    Urinalysis    Component Value Date/Time   COLORURINE YELLOW (A) 03/12/2019 1711   APPEARANCEUR Cloudy (A) 04/01/2019 1340   LABSPEC 1.010 03/12/2019 1711   PHURINE 6.0 03/12/2019 1711   GLUCOSEU Negative 04/01/2019 1340   HGBUR MODERATE (A) 03/12/2019 1711   BILIRUBINUR Negative 04/01/2019 1340   [place] 03/12/2019 1711   PROTEINUR 1+ (A) 04/01/2019 1340   PROTEINUR 30 (A) 03/12/2019 1711   NITRITE Negative 04/01/2019 1340   NITRITE NEGATIVE 03/12/2019 1711   LEUKOCYTESUR 2+ (A) 04/01/2019 1340   LEUKOCYTESUR MODERATE (A) 03/12/2019 1711    I have reviewed the labs.   Pertinent Imaging: CLINICAL DATA:  Flank pain. Acute kidney  injury.  EXAM: CT ABDOMEN AND PELVIS WITHOUT CONTRAST  TECHNIQUE: Multidetector CT imaging of the abdomen and pelvis was performed following the standard protocol without IV contrast.  COMPARISON:  None.  FINDINGS: Lower chest: No acute findings.  Hepatobiliary: No mass visualized on this unenhanced exam. Gallbladder is unremarkable. No evidence of biliary ductal dilatation.  Pancreas: No mass or inflammatory process visualized on this unenhanced exam.  Spleen:  Within normal limits in size.  Adrenals/Urinary tract: No evidence of urolithiasis or hydronephrosis. Foley catheter is seen within the urinary bladder which is empty.  Stomach/Bowel: Large hiatal hernia is seen. Prior right hemicolectomy. No evidence of obstruction, inflammatory process, or abnormal fluid collections. Diverticulosis is seen mainly involving the sigmoid colon, however there is no evidence of diverticulitis.  Vascular/Lymphatic: No pathologically enlarged lymph nodes identified. No evidence of abdominal aortic aneurysm. Aortic atherosclerosis incidentally noted.  Reproductive:  No mass or other significant abnormality.  Other: 2 moderate midline ventral abdominal wall hernias are seen which contain multiple small bowel loops. No evidence of bowel obstruction or strangulation.  Musculoskeletal:  No suspicious bone lesions identified.  IMPRESSION: 1. No evidence of urolithiasis, hydronephrosis, or other acute findings. 2. Large hiatal hernia. 3. Two moderate ventral abdominal wall hernias containing multiple small bowel loops. No evidence of bowel obstruction or strangulation. 4. Colonic diverticulosis. No radiographic evidence of diverticulitis.   Electronically Signed   By: Marlaine Hind M.D.   On: 03/12/2019 18:37   Assessment & Plan:    1. Urinary retention Managed well at this time by granddaughter with intermittent straight-catheterization Will follow up as  needed  Return if symptoms worsen or fail to improve.  These notes generated with voice recognition software. I apologize for typographical errors.  Alexis Council, PA-C  [facility] [address]  [place], [place] [address] [phone]

## 2019-04-26 ENCOUNTER — Ambulatory Visit: Payer: Medicare HMO | Admitting: Adult Health

## 2019-04-27 ENCOUNTER — Ambulatory Visit: Payer: Medicare HMO | Admitting: Adult Health

## 2019-04-27 ENCOUNTER — Other Ambulatory Visit: Payer: Self-pay

## 2019-04-27 ENCOUNTER — Encounter: Payer: Self-pay | Admitting: Adult Health

## 2019-04-27 VITALS — BP 102/72 | HR 65 | Temp 97.3°F | Ht 63.0 in | Wt 165.2 lb

## 2019-04-27 DIAGNOSIS — I63411 Cerebral infarction due to embolism of right middle cerebral artery: Secondary | ICD-10-CM

## 2019-04-27 DIAGNOSIS — I69319 Unspecified symptoms and signs involving cognitive functions following cerebral infarction: Secondary | ICD-10-CM | POA: Diagnosis not present

## 2019-04-27 DIAGNOSIS — I10 Essential (primary) hypertension: Secondary | ICD-10-CM

## 2019-04-27 DIAGNOSIS — E785 Hyperlipidemia, unspecified: Secondary | ICD-10-CM

## 2019-04-27 DIAGNOSIS — I6601 Occlusion and stenosis of right middle cerebral artery: Secondary | ICD-10-CM

## 2019-04-27 DIAGNOSIS — I4891 Unspecified atrial fibrillation: Secondary | ICD-10-CM | POA: Diagnosis not present

## 2019-04-27 NOTE — Patient Instructions (Signed)
Continue Eliquis (apixaban) daily  and atorvastatin  for secondary stroke prevention  Continue to follow up with PCP regarding cholesterol and blood pressure management   Start zoloft and trazodone to help with possible underlying depression and further benefit with sleep  Agree with starting therapy for left hip pain and improvement of ambulation  Continue to monitor blood pressure at home  Maintain strict control of hypertension with blood pressure goal below 130/90, diabetes with hemoglobin A1c goal below 6.5% and cholesterol with LDL cholesterol (bad cholesterol) goal below 70 mg/dL. I also advised the patient to eat a healthy diet with plenty of whole grains, cereals, fruits and vegetables, exercise regularly and maintain ideal body weight.  Followup in the future with me in 3 months or call earlier if needed       Thank you for coming to see [place] at [facility]. I hope we have been able to provide you high quality care today.  You may receive a patient satisfaction survey over the next few weeks. We would appreciate your feedback and comments so that we may continue to improve ourselves and the health of our patients.

## 2019-04-27 NOTE — Progress Notes (Signed)
[facility] [address] [place]. [place] [address] [phone]       STROKE FOLLOW UP NOTE  Ms. AUDRIONNA LAMPTON Date of Birth:  08/07/1917 Medical Record Number:  [id number]   Reason for Referral: stroke follow up     HPI:   Ms. Schraeder is a 86 year old female who is being seen today, 04/27/2019, for stroke follow-up accompanied by her daughter and granddaughter.  She has been stable from a stroke standpoint with greatest concern cognitive impairment post stroke.  Continues to have visual hallucinations, sundowning, insomnia, increased confusion upon awakening and occasional agitation.  Initiated Namenda at prior visit but daughter is unsure of benefit especially as she has been treated for multiple UTIs since prior visit.  Namenda continued for 1 month and then discontinued she was seen by her PCP today who initiated sertraline and trazodone and daughter plans on picking up this afternoon.  She continues to follow with urology and due to patient consistently removing Foley catheters, she is now being straight cath for urinary retention.  Continues on Eliquis without bleeding or bruising.  Continues on atorvastatin without myalgias.  Blood pressure today 102/72.  She did have follow-up with IR in 03/2019 and recommended pursuing conservative management with plans on repeating CTA around 06/2019.  No further concerns at this time.   History provided for reference purposes only Stroke admission 12/26/2018: Ms.Mykenzi M Guthrieis a 86 y.o.femalewith history of HTN, HLD CAD found down by son presenting to [place] [facility] on 12/26/2018 with L sided weakness, neglect and anosognosia.  CT head unremarkable but unable to administer TPA due to unknown time of onset.  CTA showed proximal R M2 occlusion therefore transferred to [facility] for possible intervention.  Upon arrival, transferred to IR for R M2 occlusion w/ TICI2b revascularization.  MRI showed a right large MCA infarct with  punctate left ACA infarcts embolic pattern likely secondary to new onset AF.  Repeat MRA showed patent right MCA.  2D echo normal EF without cardiac source of embolus identified.  Previously on aspirin 81 mg daily and recommended initiating Eliquis for new onset atrial fibrillation.  COVID-19 negative.  History of HTN with hypotension on admission resolved and recommended long-term BP goal normotensive range.  LDL 118 and recommend initiating atorvastatin 20 mg daily.  A1c 6.1.  Other stroke risk factors include advanced age and CAD status post CABG.  Other active problems include stage III colon cancer metastatic to the liver, hypokalemia, GERD on PPI and AKI.  Also found to have UTI with treatment provided during admission.  Urinary retention with placement of Foley catheter.  Residual deficits of mild dysarthria, left hemiparesis and dysphagia.  Therapies initially recommended CIR but insurance declined rehab admission therefore recommended SNF but family declined with decision on home health therapy.  She was discharged home in stable condition on 01/03/2019.  Ms. Riker is a 86 year old female who is being seen today via virtual visit for hospital follow-up accompanied by her daughter and niece who provides majority of history.  Daughter states that she has recovered greatly from a physical standpoint with only mild left hemiparesis but no residual dysphagia and only intermittent dysarthria but continues to have cognitive impairment post stroke.  She endorses possible age-related cognitive impairment prior to stroke but was living independently without difficulty.  She is currently living in her own home but daughter and niece provide 24-hour supervision.  She does become agitated at times especially when family attempts to corrector such as when  she forgets that she is in her own home or does not have an appointment to go to.  Patient has also questioned where her grandmother and sister currently were who  have passed away many years ago.  These symptoms do wax/wane.  She was initiated on Seroquel during hospitalization but daughter discontinued this medication due to worsening behaviors and cognition.  Subjectively denies depression or anxiety.  She also has difficulty with insomnia which has been ongoing.  She continues on melatonin 1 mg nightly.  She continues to receive home health therapies including ST, OT and PT.  She is currently ambulating with a rolling walker which she was using previously due to bilateral chronic knee pain.  She has continued on Eliquis for new onset atrial fibrillation and stroke prevention without bleeding or bruising.  Continues on atorvastatin 20 mg daily without myalgias.  Blood pressure monitored at home with family unable to provide a specific levels but does endorse BP stable and occasional heart rate in the 40s.  She does not currently have established cardiologist. She did have follow-up with urology and due to failure of voiding trial, Foley catheter placed and may trial repeat voiding trial next month with urology.  No further concerns at this time.     ROS:   14 system review of systems performed and negative with exception of weakness, pain, gait impairment, confusion and memory loss  PMH:  Past Medical History:  Diagnosis Date  . Acute gastric ulcer without hemorrhage or perforation   . Anemia   . Arthritis   . CAD (coronary artery disease), native coronary artery 02/24/2019   S/P remote CABG in 2005  . Cancer ([place]) 04/15/2016   T3, N1a adenocarcinoma of the hepatic flexure. No mismatch repair gene abnormality.   . Chronic kidney disease    LABS 3/18 COMPARED WITH 1 YEAR AGO  . Colon cancer, ascending ([place]) 05/21/2016  . Elevated CEA 08/30/2018  . Fall 2018  . GERD (gastroesophageal reflux disease)   . Goals of care, counseling/discussion 06/17/2017  . Hypercholesteremia   . Hypertension   . Hypocalcemia 03/21/2017  . Insomnia   . Iron deficiency anemia  due to chronic blood loss   . Liver metastasis ([place]) 09/17/2018  . 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 Stenosis of intestine ([place])   . Stroke ([place])   . Weight loss 11/07/2015    PSH:  Past Surgical History:  Procedure Laterality Date  . CARDIAC SURGERY  2005   bypass  . COLONOSCOPY WITH PROPOFOL N/A 04/15/2016   Procedure: COLONOSCOPY WITH PROPOFOL;  Surgeon: Lucilla Lame, MD;  Location: [facility] END[facility]COPY;  Service: Endoscopy;  Laterality: N/A;  . CORONARY ARTERY BYPASS GRAFT  2005   x 2  . ESOPHAGOGASTRODUODENOSCOPY (EGD) WITH PROPOFOL N/A 04/15/2016   Procedure: ESOPHAGOGASTRODUODENOSCOPY (EGD) WITH PROPOFOL;  Surgeon: Lucilla Lame, MD;  Location: [facility] ENDOSCOPY;  Service: Endoscopy;  Laterality: N/A;  . IR ANGIO VERTEBRAL SEL SUBCLAVIAN INNOMINATE UNI R MOD SED  12/27/2018  . IR CT HEAD LTD  12/27/2018  . IR PERCUTANEOUS ART THROMBECTOMY/INFUSION INTRACRANIAL INC DIAG ANGIO  12/27/2018  . LAPAROSCOPIC RIGHT COLECTOMY Right 06/05/2016   Procedure: LAPAROSCOPIC RIGHT COLECTOMY;  Surgeon: Robert Bellow, MD;  Location: [facility] ORS;  Service: General;  Laterality: Right;  . RADIOLOGY WITH ANESTHESIA N/A 12/27/2018   Procedure: CODE STROKE;  Surgeon: Radiologist, Medication, MD;  Location: [facility];  Service: Radiology;  Laterality: N/A;  . toe removal    . TONSILLECTOMY  age 60  . UMBILICAL HERNIA REPAIR  06/05/2016   Procedure: HERNIA REPAIR UMBILICAL ADULT;  Surgeon: Robert Bellow, MD;  Location: [facility] ORS;  Service: General;;    Social History:  Social History   Socioeconomic History  . Marital status: Widowed    Spouse name: Not on file  . Number of children: Not on file  . Years of education: Not on file  . Highest education level: Not on file  Occupational History  . Not on file  Tobacco Use  . Smoking status: Never Smoker  . Smokeless tobacco: Never Used  Substance and Sexual Activity  . Alcohol use: No  . Drug use: No  . Sexual activity: Not Currently     Birth control/protection: Post-menopausal  Other Topics Concern  . Not on file  Social History Narrative  . Not on file   Social Determinants of Health   Financial Resource Strain:   . Difficulty of Paying Living Expenses:   Food Insecurity:   . Worried About Charity fundraiser in the Last Year:   . Arboriculturist in the Last Year:   Transportation Needs:   . Film/video editor (Medical):   Marland Kitchen Lack of Transportation (Non-Medical):   Physical Activity:   . Days of Exercise per Week:   . Minutes of Exercise per Session:   Stress:   . Feeling of Stress :   Social Connections:   . Frequency of Communication with Friends and Family:   . Frequency of Social Gatherings with Friends and Family:   . Attends Religious Services:   . Active Member of Clubs or Organizations:   . Attends Archivist Meetings:   Marland Kitchen Marital Status:   Intimate Partner Violence:   . Fear of Current or Ex-Partner:   . Emotionally Abused:   Marland Kitchen Physically Abused:   . Sexually Abused:     Family History: No family history on file.  Medications:   Current Outpatient Medications on File Prior to Visit  Medication Sig Dispense Refill  . acetaminophen (TYLENOL) 500 MG tablet Take 500 mg by mouth at bedtime.     Marland Kitchen amLODipine (NORVASC) 5 MG tablet Take 1 tablet (5 mg total) by mouth daily. 30 tablet 2  . apixaban (ELIQUIS) 5 MG TABS tablet Take 1 tablet (5 mg total) by mouth 2 (two) times daily. 60 tablet 2  . atorvastatin (LIPITOR) 20 MG tablet Take 1 tablet (20 mg total) by mouth daily at 6 PM. 30 tablet 2  . AZO-CRANBERRY PO Take by mouth in the morning and at bedtime.    . Calcium Carb-Cholecalciferol (CALTRATE 600+D3) 600-800 MG-UNIT TABS Take 1 tablet by mouth daily.     . Melatonin 3 MG TABS Take by mouth at bedtime.    . metoprolol tartrate (LOPRESSOR) 25 MG tablet Take 1 tablet (25 mg total) by mouth 2 (two) times daily. 60 tablet 2  . omeprazole (PRILOSEC) 20 MG capsule Take 20 mg by mouth  at bedtime.     . sertraline (ZOLOFT) 25 MG tablet Take 25 mg by mouth daily.    . traZODone (DESYREL) 50 MG tablet Take 50 mg by mouth at bedtime.     No current facility-administered medications on file prior to visit.    Allergies:   Allergies  Allergen Reactions  . Amoxicillin Rash    Has patient had a PCN reaction causing immediate rash, facial/tongue/throat swelling, SOB or lightheadedness with hypotension:Yes Has patient had a PCN reaction  causing severe rash involving mucus membranes or skin necrosis:unsure Has patient had a PCN reaction that required hospitalization:No Has patient had a PCN reaction occurring within the last 10 years:No If all of the above answers are "NO", then may proceed with Cephalosporin use.   . Ciprofloxacin Nausea And Vomiting     Physical Exam  Today's Vitals   04/27/19 1424  BP: 102/72  Pulse: 65  Temp: (!) 97.3 F (36.3 C)  SpO2: 98%  Weight: 165 lb 3.2 oz (74.9 kg)  Height: 5' 3"  (1.6 m)   Body mass index is 29.26 kg/m.  General: well developed, well nourished, very pleasant elderly Caucasian female, seated, in no evident distress Head: head normocephalic and atraumatic.   Neck: supple with no carotid or supraclavicular bruits Cardiovascular: regular rate and rhythm, no murmurs Musculoskeletal: no deformity Skin:  no rash/petichiae Vascular:  Normal pulses all extremities   Neurologic Exam Mental Status: Awake and fully alert.   Disoriented to place and time. Recent and remote memory impaired. Attention span, concentration and fund of knowledge impaired. Mood and affect appropriate.  Cranial Nerves: Pupils equal, briskly reactive to light. Extraocular movements full without nystagmus. Visual fields full to confrontation.  HOH bilaterally. Facial sensation intact. Face, tongue, palate moves normally and symmetrically.  Motor: Normal bulk and tone. Normal strength in all tested extremity muscles except mild decrease left hand  dexterity. Sensory.: intact to touch , pinprick , position and vibratory sensation.  Coordination: Rapid alternating movements normal in all extremities except slightly decreased hand dexterity. Finger-to-nose and heel-to-shin performed accurately bilaterally. Gait and Station: Deferred as patient currently in wheelchair Reflexes: 1+ and symmetric. Toes downgoing.      Diagnostic Data (Labs, Imaging, Testing)  Mr Virgel Paling Wo Contrast 12/28/2018 1. Revascularization of the Right MCA branches since the CTA yesterday. No new or recurrent occlusion identified. 2. Extensive bilateral ICA siphon atherosclerosis with stable mild to moderate stenosis greater on the left. 3. 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   Mr Brain Wo Contrast 12/28/2018 1. Confluent infarct tracking from the superior right temporal gyrus posteriorly through the lateral right occipital lobe. Additional infarct in the right insula, scattered in the right frontal lobe. 2. Cytotoxic edema and Heidelberg Classification 1A petechial blood. No intracranial mass effect. 3. Punctate diffusion abnormality also in the contralateral left parietal lobe. Chronic small cerebellar infarcts.   Cerebral Angio 12/27/2018 1116 RT common carotid arteriogram followed by partial revacularization of occluded RT MCA dominant inf division with x 2 passes with solitaire 27m x 40 mm x retiever device achieving a TICI 2b revascularization  CT head 12/27/2018 1116 1. Abnormal hypodensity within the right insula consistent with acute infarction change. ASPECTS 9. 2. Generalized parenchymal atrophy with chronic small vessel ischemic disease. Chronic infarcts within the bilateral cerebellum. CTA neck: Common carotid, internal carotid and vertebral arteries patent within the neck bilaterally without significant stenosis.   Ct Angio Head W Or Wo Contrast 12/27/2018 1116 1. Segmental occlusion of a proximal M2 right middle cerebral artery  branch. Some reconstitution of flow is seen more distally within this vessel. 2. Additional intracranial atherosclerotic stenoses, most notably as follows. Mild/moderate focal stenosis within the intracranial left vertebral artery. Moderate focal stenosis within the P2 right posterior cerebral artery. 3. 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   Ct Angio Neck W Or Wo Contrast 12/27/2018 1116 Common carotid, internal carotid and vertebral arteries patent within the neck bilaterally without significant stenosis.  Ct Cerebral Perfusion W Contrast 12/27/2018 1116 No core infarct is identified by the perfusion software. However, definite acute infarction changes are demonstrated within the right insula on concurrent noncontrast head CT. ASPECTS 9. 60 mL region of hypoperfused parenchyma detected predominantly within the right MCA vascular territory. Reported mismatch 60 mL.   Ct Head Wo Contrast 12/27/2018 1000 Atrophy with periventricular small vessel disease. Prior small infarct in the superior right cerebellum. No acute infarct evident. No mass or hemorrhage. There are foci of arterial vascular calcification. There is mucosal thickening in several ethmoid air cells.   Dg Chest 1 View 12/29/2018 Interval extubation. No interval change in the appearance of the lungs since the prior radiograph.  12/27/2018 1130 No active disease.   Portable Chest X-ray 12/27/2018 1718 Endotracheal tube well positioned 3 cm above the carina. Cardiomegaly. Previous CABG. No active process evident otherwise.   Dg Hip Unilat W Or Wo Pelvis 2-3 Views Left 12/27/2018 1130 Negative.     ASSESSMENT: MAYARI MATUS is a 86 y.o. year old female presented with left-sided weakness neglect and anosogniosia on 12/26/2018 with stroke work-up revealing right MCA infarct due to right M2 occlusion status post IR with TICI 2B refill ablation secondary to new onset atrial fibrillation.  Vascular risk factors include stage III colon cancer with mets to the liver, new onset atrial fibrillation, CAD status post CABG, HTN and HLD.  Residual deficits of mild left hemiparesis and cognitive impairment    PLAN:  1. Right MCA stroke: Continue Eliquis (apixaban) daily  and atorvastatin for secondary stroke prevention. Maintain strict control of hypertension with blood pressure goal below 130/90, diabetes with hemoglobin A1c goal below 6.5% and cholesterol with LDL cholesterol (bad cholesterol) goal below 70 mg/dL.  I also advised the patient to eat a healthy diet with plenty of whole grains, cereals, fruits and vegetables, exercise regularly with at least 30 minutes of continuous activity daily and maintain ideal body weight. 2. Right M2 occlusion s/p thrombectomy: Continue to follow with IR for ongoing monitoring management 3. Atrial fibrillation: Continue Eliquis and ongoing follow-up with cardiology for monitoring and management 4. HTN: Advised to continue current treatment regimen.  Advised to continue to monitor at home along with continued follow-up with PCP for management 5. HLD: Advised to continue current treatment regimen along with continued follow-up with PCP for future prescribing and monitoring of lipid panel 6. Cognitive impairment, poststroke: Multiple questions and long discussion regarding likely vascular cognitive impairment/dementia and made importance of managing stroke risk factors, exercise as tolerated and healthy diet.  Did not recommend restarting Namenda as lack of benefit.  Daughter plans on starting Zoloft and trazodone as prescribed by PCP.   Follow-up in 3 months or call earlier if needed   I spent 35 minutes of face-to-face and non-face-to-face time with patient.  This included previsit chart review, lab review, study review, order entry, electronic health record documentation, patient education     Frann Rider, [facility]  [facility] [address] [place], [place] [address]  Phone [phone] Fax [phone] Note: This document was prepared with digital dictation and possible smart phrase technology. Any transcriptional errors that result from this process are unintentional.

## 2019-05-13 NOTE — Progress Notes (Signed)
I agree with the above plan 

## 2019-05-16 ENCOUNTER — Ambulatory Visit: Payer: Medicare HMO | Admitting: Urology

## 2019-05-16 NOTE — Progress Notes (Signed)
[facility]  [address], [address] [place], [place] [address] Phone: [phone]  Fax: [phone]   Clinic Day:  05/19/2019  Referring physician: Lynnell Jude, MD  Chief Complaint: Alexis Haas is a 86 y.o. female with stage IV colon cancer and iron deficiency anemia who is seen for a 3 month assessment.  HPI: The patient was last seen in the medical oncology clinic on 02/16/2019 via telephone. At that time, her performance status had declined since her CVA in 12/2018. Hematocrit was 38.8, hemoglobin 12.8, platelets 215,000, WBC 5,700. Ferritin was 128. Creatinine 1.18. Calcium 8.2. CEA was 6.7.   Patient was seen by cardiologist Fransico Him on 02/24/2019. She had no concerns or symptoms. She continued Eliquis 5 mg BID and Lopressor 25 mg BID.   Patient was seen in urology by Debroah Loop, PA-C on 03/11/2019 for gross hematuria. She was presented to the emergency department twice after pulling out her Foley catheter. Her granddaughter reported that the patient occasionally forgets that the catheter is there and attempts to remove it.  Gross hematuria was felt secondary to catheter irritation and urethral trauma in the setting of recent catheter pulls. Removal of Foley was recommended; the granddaughter was to call back with a final decision.  Patient was last seen in the urology by Zara Council, PA-Con 04/18/2019. Her granddaughter noted that she was doing well.  She was doing in and out catheterizations 3-4 times/day.  Patient denied any gross hematuria or dysuria. Patient denied any fever, chills, nausea or vomiting.   She had a follow up in neurology with Frann Rider, NP on 04/27/2019. She had been stable from a stroke standpoint with greatest concern cognitive impairment post stroke. She continued to have visual hallucinations, sundowning, insomnia, increased confusion upon awakening and occasional agitation. She was on Eliquis without bleeding  or bruising.  She was on atorvastatin without myalgias. Follow up was planned in 3 months.   During the interim, the patient has been doing "fair". She has vascular dementia s/p CVA in 12/2018. Her niece notes she sees imaginary people who live with her. She has trouble remembering where she lives even though she is at her house. Her niece reports she has a prolapsed bladder. She has an abdominal hernia without pain. She has knee pain secondary to having "lack of cartilage". She has trouble sometimes getting in and out of bed.   She reports left leg and hip pain. She has had lower back pain x 1 day. They are using a heating pad for her back and legs to relieve pain. She is in physical therapy twice a week. She notes pain when walking.   She is eating well. She is able to fix her own breakfast and maker her own coffee. She had diarrhea for a day about 1 week ago. She has had some insomnia. Patient is able to perform ALDs with minimal assistance. Patient it taking sponge bath. Her niece notes that she had some fluid leakage and it was wrapped with bandages x 1 month ago and has since resolved. Once a week ,she will soak her feet in epsom salt. Her daughter reports that she has some leg swelling (L>R).   Patient's family had questions and concerns. Questions were answered. Family agreed to Hospice referral.    Past Medical History:  Diagnosis Date  . Acute gastric ulcer without hemorrhage or perforation   . Anemia   . Arthritis   . CAD (coronary artery disease), native coronary  artery 02/24/2019   S/P remote CABG in 2005  . Cancer ([place]) 04/15/2016   T3, N1a adenocarcinoma of the hepatic flexure. No mismatch repair gene abnormality.   . Chronic kidney disease    LABS 3/18 COMPARED WITH 1 YEAR AGO  . Colon cancer, ascending ([place]) 05/21/2016  . Elevated CEA 08/30/2018  . Fall 2018  . GERD (gastroesophageal reflux disease)   . Goals of care, counseling/discussion 06/17/2017  . Hypercholesteremia   .  Hypertension   . Hypocalcemia 03/21/2017  . Insomnia   . Iron deficiency anemia due to chronic blood loss   . Liver metastasis ([place]) 09/17/2018  . 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 Stenosis of intestine ([place])   . Stroke ([place])   . Weight loss 11/07/2015    Past Surgical History:  Procedure Laterality Date  . CARDIAC SURGERY  2005   bypass  . COLONOSCOPY WITH PROPOFOL N/A 04/15/2016   Procedure: COLONOSCOPY WITH PROPOFOL;  Surgeon: Lucilla Lame, MD;  Location: [facility];  Service: Endoscopy;  Laterality: N/A;  . CORONARY ARTERY BYPASS GRAFT  2005   x 2  . ESOPHAGOGASTRODUODENOSCOPY (EGD) WITH PROPOFOL N/A 04/15/2016   Procedure: ESOPHAGOGASTRODUODENOSCOPY (EGD) WITH PROPOFOL;  Surgeon: Lucilla Lame, MD;  Location: [facility] END[facility]COPY;  Service: Endoscopy;  Laterality: N/A;  . IR ANGIO VERTEBRAL SEL SUBCLAVIAN INNOMINATE UNI R MOD SED  12/27/2018  . IR CT HEAD LTD  12/27/2018  . IR PERCUTANEOUS ART THROMBECTOMY/INFUSION INTRACRANIAL INC DIAG ANGIO  12/27/2018  . LAPAROSCOPIC RIGHT COLECTOMY Right 06/05/2016   Procedure: LAPAROSCOPIC RIGHT COLECTOMY;  Surgeon: Robert Bellow, MD;  Location: [facility] ORS;  Service: General;  Laterality: Right;  . RADIOLOGY WITH ANESTHESIA N/A 12/27/2018   Procedure: CODE STROKE;  Surgeon: Radiologist, Medication, MD;  Location: [facility];  Service: Radiology;  Laterality: N/A;  . toe removal    . TONSILLECTOMY     age 59  . UMBILICAL HERNIA REPAIR  06/05/2016   Procedure: HERNIA REPAIR UMBILICAL ADULT;  Surgeon: Robert Bellow, MD;  Location: [facility] ORS;  Service: General;;    History reviewed. No pertinent family history.  Social History:  reports that she has never smoked. She has never used smokeless tobacco. She reports that she does not drink alcohol or use drugs.She states that she has worked her whole life (since the age of 20). She worked at [employer] part time. 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 She lives in [place]  at [place]. She lives alone. Her husband died 61 years ago. Her daughter is Theodora Blow is power of attorney (home: [phone]; work: [phone] cell: [phone]). The patient is ccompanied by daughter and niece via [place] today.  Allergies:  Allergies  Allergen Reactions  . Amoxicillin Rash    Has patient had a PCN reaction causing immediate rash, facial/tongue/throat swelling, SOB or lightheadedness with hypotension:Yes Has patient had a PCN reaction causing severe rash involving mucus membranes or skin necrosis:unsure Has patient had a PCN reaction that required hospitalization:No Has patient had a PCN reaction occurring within the last 10 years:No If all of the above answers are "NO", then may proceed with Cephalosporin use.   . Ciprofloxacin Nausea And Vomiting    Current Medications: Current Outpatient Medications  Medication Sig Dispense Refill  . acetaminophen (TYLENOL) 500 MG tablet Take 500 mg by mouth at bedtime.     Marland Kitchen amLODipine (NORVASC) 5 MG tablet Take 1 tablet (5 mg total) by mouth daily. 30 tablet 2  .  apixaban (ELIQUIS) 5 MG TABS tablet Take 1 tablet (5 mg total) by mouth 2 (two) times daily. 60 tablet 2  . atorvastatin (LIPITOR) 20 MG tablet Take 1 tablet (20 mg total) by mouth daily at 6 PM. 30 tablet 2  . AZO-CRANBERRY PO Take by mouth in the morning and at bedtime.    . Calcium Carb-Cholecalciferol (CALTRATE 600+D3) 600-800 MG-UNIT TABS Take 1 tablet by mouth daily.     . Melatonin 3 MG TABS Take by mouth at bedtime.    . metoprolol tartrate (LOPRESSOR) 25 MG tablet Take 1 tablet (25 mg total) by mouth 2 (two) times daily. 60 tablet 2  . omeprazole (PRILOSEC) 20 MG capsule Take 20 mg by mouth at bedtime.     . sertraline (ZOLOFT) 25 MG tablet Take 25 mg by mouth daily.    . traZODone (DESYREL) 50 MG tablet Take 50 mg by mouth at bedtime.     No current facility-administered medications for this visit.    Review of Systems  Constitutional: Negative  for chills, diaphoresis, fever, malaise/fatigue and weight loss (up 6 lbs).       Doing "fair". Needs assistance with ADLs since CVA.  HENT: Positive for hearing loss. Negative for congestion, sinus pain and sore throat.   Eyes: Negative.  Negative for blurred vision, double vision and photophobia.  Respiratory: Negative.  Negative for cough, hemoptysis, shortness of breath and wheezing.   Cardiovascular: Positive for leg swelling (R>L). Negative for chest pain, palpitations, orthopnea and PND.  Gastrointestinal: Positive for diarrhea (for 1 day about 1 week ago). Negative for abdominal pain, blood in stool, constipation, melena, nausea and vomiting.       Eating well.  Genitourinary: Negative.  Negative for dysuria, frequency, hematuria and urgency.       Prolapsed bladder.  Musculoskeletal: Positive for back pain (lower back), joint pain (knees; right hip; pain increases with walking) and myalgias (right leg pain; increases with walking). Negative for falls and neck pain.  Skin: Negative.  Negative for rash.  Neurological: Negative.  Negative for dizziness, tingling, sensory change, speech change, focal weakness, weakness and headaches.       CVA in 12/2018. Vascular dementia.  Endo/Heme/Allergies: Negative.  Does not bruise/bleed easily.  Psychiatric/Behavioral: Positive for memory loss. Negative for depression and substance abuse. The patient has insomnia. The patient is not nervous/anxious.        Dementia since CVA.  All other systems reviewed and are negative.  Performance status (ECOG):  3  Vitals Blood pressure 116/70, pulse 76, temperature (!) 96.8 F (36 C), temperature source Tympanic, resp. rate 18, weight 171 lb (77.6 kg).   Physical Exam  Constitutional: She is oriented to person, place, and time. She appears well-developed and well-nourished. No distress.  Patient sitting comfortably in a wheelchair;  She has her eyes closed for most of the visit.  HENT:  Head:  Normocephalic and atraumatic.  Mouth/Throat: Oropharynx is clear and moist. No oropharyngeal exudate.  Short styled gray hair. Mask.  Eyes: Pupils are equal, round, and reactive to light. Conjunctivae and EOM are normal. No scleral icterus.  Glasses.  Blue eyes.  Cardiovascular: Normal rate, regular rhythm and normal heart sounds.  No murmur heard. Pulmonary/Chest: Effort normal and breath sounds normal. No respiratory distress. She has no wheezes. She has no rales. She exhibits no tenderness.  Abdominal: Soft. Bowel sounds are normal. She exhibits no distension and no mass. There is no abdominal tenderness. There is no rebound and  no guarding.  Musculoskeletal:        General: Tenderness and edema (BLE; left > right) present. Normal range of motion.     Cervical back: Normal range of motion and neck supple.  Lymphadenopathy:       Head (right side): No preauricular, no posterior auricular and no occipital adenopathy present.       Head (left side): No preauricular, no posterior auricular and no occipital adenopathy present.    She has no cervical adenopathy.    She has no axillary adenopathy.       Right: No inguinal and no supraclavicular adenopathy present.       Left: No inguinal and no supraclavicular adenopathy present.  Neurological: She is alert and oriented to person, place, and time.  Skin: Skin is warm and dry. No rash noted. She is not diaphoretic. No erythema. No pallor.  Psychiatric: She has a normal mood and affect. Her behavior is normal. Judgment normal.  Nursing note and vitals reviewed.   Appointment on 05/19/2019  Component Date Value Ref Range Status  . Sodium 05/19/2019 133* 135 - 145 mmol/L Final  . Potassium 05/19/2019 4.1  3.5 - 5.1 mmol/L Final  . Chloride 05/19/2019 98  98 - 111 mmol/L Final  . CO2 05/19/2019 26  22 - 32 mmol/L Final  . Glucose, Bld 05/19/2019 109* 70 - 99 mg/dL Final   Glucose reference range applies only to samples taken after fasting for  at least 8 hours.  . BUN 05/19/2019 22  8 - 23 mg/dL Final  . Creatinine, Ser 05/19/2019 1.11* 0.44 - 1.00 mg/dL Final  . Calcium 05/19/2019 8.6* 8.9 - 10.3 mg/dL Final  . Total Protein 05/19/2019 6.5  6.5 - 8.1 g/dL Final  . Albumin 05/19/2019 3.2* 3.5 - 5.0 g/dL Final  . AST 05/19/2019 87* 15 - 41 U/L Final  . ALT 05/19/2019 117* 0 - 44 U/L Final  . Alkaline Phosphatase 05/19/2019 281* 38 - 126 U/L Final  . Total Bilirubin 05/19/2019 0.5  0.3 - 1.2 mg/dL Final  . GFR calc non Af Amer 05/19/2019 41* >60 mL/min Final  . GFR calc Af Amer 05/19/2019 48* >60 mL/min Final  . Anion gap 05/19/2019 9  5 - 15 Final   Performed at [facility], [address], [place], [place] [address]  . WBC 05/19/2019 5.1  4.0 - 10.5 K/uL Final  . RBC 05/19/2019 3.70* 3.87 - 5.11 MIL/uL Final  . Hemoglobin 05/19/2019 11.0* 12.0 - 15.0 g/dL Final  . HCT 05/19/2019 33.5* 36.0 - 46.0 % Final  . MCV 05/19/2019 90.5  80.0 - 100.0 fL Final  . MCH 05/19/2019 29.7  26.0 - 34.0 pg Final  . MCHC 05/19/2019 32.8  30.0 - 36.0 g/dL Final  . RDW 05/19/2019 13.7  11.5 - 15.5 % Final  . Platelets 05/19/2019 232  150 - 400 K/uL Final  . nRBC 05/19/2019 0.0  0.0 - 0.2 % Final  . Neutrophils Relative % 05/19/2019 69  % Final  . Neutro Abs 05/19/2019 3.6  1.7 - 7.7 K/uL Final  . Lymphocytes Relative 05/19/2019 14  % Final  . Lymphs Abs 05/19/2019 0.7  0.7 - 4.0 K/uL Final  . Monocytes Relative 05/19/2019 12  % Final  . Monocytes Absolute 05/19/2019 0.6  0.1 - 1.0 K/uL Final  . Eosinophils Relative 05/19/2019 4  % Final  . Eosinophils Absolute 05/19/2019 0.2  0.0 - 0.5 K/uL Final  . Basophils Relative 05/19/2019 0  %  Final  . Basophils Absolute 05/19/2019 0.0  0.0 - 0.1 K/uL Final  . Immature Granulocytes 05/19/2019 1  % Final  . Abs Immature Granulocytes 05/19/2019 0.03  0.00 - 0.07 K/uL Final   Performed at [facility], [address] [place], [place] [address]    Assessment:  Alexis Haas is a 86 y.o. female with stage IVadenocarcinoma of the transverse colons/p laparoscopic assisted right hemicolectomy with ileotransverse colostomy on 06/05/2016. Pathology revealed a 3.7 cm moderately differentiated adenocarcinoma. Tumor invaded the muscularis propria into the pericolorectal tissue. One of 28 lymph nodes were positive. Margins were negative. Pathologic stagewas T3N1a (stage III). MSI/MMR was intact.  PET scan on 05/19/2016 revealed markedly hypermetabolic lesion in the distal ascending colon extending into the proximal hepatic flexure, consistent with the known primary malignancy. There was a tiny focus of FDG uptake in the medial right liver, just minimally above the background expected mottled hepatic uptake. CT imaging suggests there may be a tiny 9 mm hypoattenuating focus at this location. While not definite, imaging features to raise concern for the possibility of metastatic disease in the liver.   PET scanon 06/26/2017 revealed no evidence of metastatic disease.PET scanon 09/07/2018 revealed new right hepatic lobe metastasis and no additionalevidence of metastatic disease.  CEAhas been followed: 6.4 on 05/07/2016, 4.8 on 06/25/2016, 5.4 on 09/10/2016, 5.6 on 12/17/2016, 5.3 on 03/18/2017, 4.9 on 06/17/2017, 5.5 on 10/21/2017, 5.3 on 02/17/2018, 7.9 on 08/18/2018, 6.7 on 02/15/2019, and 6.7 on 05/19/2019.  She presented withchroniciron deficiency anemiasecondary to blood loss. Invasive procedures (EGD or colonoscopy) were initially deferred secondary to her age. Guaiac cards were negative (12/2014 and 10/2015) then positive (01/2016). Diet is modest. EGDon 04/15/2016 revealed a medium size hiatal hernia, nonbleeding gastric ulcer with no stigmata of bleeding.  Oral ironis ineffective. She received 2 units PRBCson 10/11/2015 for a hematocrit of 21.1 and hemoglobin 6.4. She has received Ferahemeon several occasions: 01/11/2015, 02/01/2015,  03/15/2015, 03/22/2015, 10/16/2015, 01/09/2016, 03/05/2016, and on 05/09/2016. Oral ironwas discontinued on 04/12/2018.  Ferritinhas been monitored: 5 on 01/02/2015, 36 on 01/31/2105, 26 on 03/08/2015, 184 on 05/02/2015, 7 on 10/10/2015, 91 on 11/07/2015, 7 on 01/02/2016, 14 on 03/03/2016, 33 on 04/07/2016,12 on 05/07/2016, 21 on 06/25/2016, 14 on 07/23/2016, 14 on 09/10/2016, 24 on 12/17/2016, 13 on 03/18/2017, 33 on 06/17/2017, 37 on 10/21/2017, 100 on 02/17/2018, 33 on 08/18/2018, and 128 on 02/15/2019.  She has chronic mild renal insufficiency. Creatinine is 1.43(CrCl 22.1 ml/min)today.  She has chronic lower extremity edema(left >right). Left lower extremity duplex on 07/23/2016 revealed no evidence of DVT.  PET scanon 09/07/2018 revealed new right hepatic lobe metastasis and no additionalevidence of metastatic disease. There was gallbladder sludge, right adrenal adenoma, and two large midline ventral abdominal and pelvic wall hernias containing unobstructed bowel.  She was admitted to [facility] from 12/27/2018 to 01/03/2019 with a right acute middle cerebral artery (MCA) ischemic stroke.  She underwent thrombectomy by interventional radiology on 12/27/2018. She was switched to Eliquis on 12/31/2018.  She has had urinary retention since her hospitalization.  She had a chronic indwelling Foley catheter then was switched to in and out catheterizations.  Symptomatically, she has had a decline in performance status s/p CVA.  She has dementia.  She has metastatic colon cancer to the liver.  She denies any pain.  Plan: 1.   Labs today: CBC with diff, CMP, CEA. 2.   Stage IV adenocarcinoma of the ascending colon Clinically, she denies any pain. Patient is  s/p right hemicolectomy. Initial pathology revealed 1 of 28 lymph nodes positive. PET scan on 09/07/2018 revealed an isolated metastasis in the liver.  She was not  a candidate for surgical resection given her age.              She was not interested in chemotherapy.             She has been receiving supportive care.  Discuss signs and symptoms of progressive colon cancer.   Discuss consideration of Hospice given patient's other needs s/p CVA.              Family is in agreement for Hospice evaluation. 3.   Hospice referral. 4.   RTC in 3 months for MD assessment and labs (CBC with diff, CMP, CEA).  I discussed the assessment and treatment plan with the patient.  The patient was provided an opportunity to ask questions and all were answered.  The patient agreed with the plan and demonstrated an understanding of the instructions.  The patient was advised to call back if the symptoms worsen or if the condition fails to improve as anticipated.  I provided 25 minutes of face-to-face time during this this encounter and > 50% was spent counseling as documented under my assessment and plan.    Lequita Asal, MD, PhD    05/19/2019, 1:48 PM  I, Alexis Haas, am acting as scribe for [employer]. Mike Gip, MD, PhD.  I, Melissa C. Mike Gip, MD, have reviewed the above documentation for accuracy and completeness, and I agree with the above.

## 2019-05-19 ENCOUNTER — Inpatient Hospital Stay (HOSPITAL_BASED_OUTPATIENT_CLINIC_OR_DEPARTMENT_OTHER): Payer: Medicare HMO | Admitting: Hematology and Oncology

## 2019-05-19 ENCOUNTER — Inpatient Hospital Stay: Payer: Medicare HMO | Attending: Hematology and Oncology

## 2019-05-19 ENCOUNTER — Other Ambulatory Visit: Payer: Self-pay

## 2019-05-19 ENCOUNTER — Encounter: Payer: Self-pay | Admitting: Hematology and Oncology

## 2019-05-19 VITALS — BP 116/70 | HR 76 | Temp 96.8°F | Resp 18 | Wt 171.0 lb

## 2019-05-19 DIAGNOSIS — N182 Chronic kidney disease, stage 2 (mild): Secondary | ICD-10-CM | POA: Diagnosis not present

## 2019-05-19 DIAGNOSIS — C182 Malignant neoplasm of ascending colon: Secondary | ICD-10-CM | POA: Insufficient documentation

## 2019-05-19 DIAGNOSIS — R600 Localized edema: Secondary | ICD-10-CM | POA: Insufficient documentation

## 2019-05-19 DIAGNOSIS — D500 Iron deficiency anemia secondary to blood loss (chronic): Secondary | ICD-10-CM

## 2019-05-19 DIAGNOSIS — C787 Secondary malignant neoplasm of liver and intrahepatic bile duct: Secondary | ICD-10-CM

## 2019-05-19 DIAGNOSIS — Z8673 Personal history of transient ischemic attack (TIA), and cerebral infarction without residual deficits: Secondary | ICD-10-CM | POA: Insufficient documentation

## 2019-05-19 DIAGNOSIS — Z7189 Other specified counseling: Secondary | ICD-10-CM | POA: Diagnosis not present

## 2019-05-19 DIAGNOSIS — I129 Hypertensive chronic kidney disease with stage 1 through stage 4 chronic kidney disease, or unspecified chronic kidney disease: Secondary | ICD-10-CM | POA: Insufficient documentation

## 2019-05-19 DIAGNOSIS — Z7902 Long term (current) use of antithrombotics/antiplatelets: Secondary | ICD-10-CM | POA: Insufficient documentation

## 2019-05-19 LAB — CBC WITH DIFFERENTIAL/PLATELET
Abs Immature Granulocytes: 0.03 10*3/uL (ref 0.00–0.07)
Basophils Absolute: 0 10*3/uL (ref 0.0–0.1)
Basophils Relative: 0 %
Eosinophils Absolute: 0.2 10*3/uL (ref 0.0–0.5)
Eosinophils Relative: 4 %
HCT: 33.5 % — ABNORMAL LOW (ref 36.0–46.0)
Hemoglobin: 11 g/dL — ABNORMAL LOW (ref 12.0–15.0)
Immature Granulocytes: 1 %
Lymphocytes Relative: 14 %
Lymphs Abs: 0.7 10*3/uL (ref 0.7–4.0)
MCH: 29.7 pg (ref 26.0–34.0)
MCHC: 32.8 g/dL (ref 30.0–36.0)
MCV: 90.5 fL (ref 80.0–100.0)
Monocytes Absolute: 0.6 10*3/uL (ref 0.1–1.0)
Monocytes Relative: 12 %
Neutro Abs: 3.6 10*3/uL (ref 1.7–7.7)
Neutrophils Relative %: 69 %
Platelets: 232 10*3/uL (ref 150–400)
RBC: 3.7 MIL/uL — ABNORMAL LOW (ref 3.87–5.11)
RDW: 13.7 % (ref 11.5–15.5)
WBC: 5.1 10*3/uL (ref 4.0–10.5)
nRBC: 0 % (ref 0.0–0.2)

## 2019-05-19 LAB — COMPREHENSIVE METABOLIC PANEL
ALT: 117 U/L — ABNORMAL HIGH (ref 0–44)
AST: 87 U/L — ABNORMAL HIGH (ref 15–41)
Albumin: 3.2 g/dL — ABNORMAL LOW (ref 3.5–5.0)
Alkaline Phosphatase: 281 U/L — ABNORMAL HIGH (ref 38–126)
Anion gap: 9 (ref 5–15)
BUN: 22 mg/dL (ref 8–23)
CO2: 26 mmol/L (ref 22–32)
Calcium: 8.6 mg/dL — ABNORMAL LOW (ref 8.9–10.3)
Chloride: 98 mmol/L (ref 98–111)
Creatinine, Ser: 1.11 mg/dL — ABNORMAL HIGH (ref 0.44–1.00)
GFR calc Af Amer: 48 mL/min — ABNORMAL LOW (ref >60)
GFR calc non Af Amer: 41 mL/min — ABNORMAL LOW (ref >60)
Glucose, Bld: 109 mg/dL — ABNORMAL HIGH (ref 70–99)
Potassium: 4.1 mmol/L (ref 3.5–5.1)
Sodium: 133 mmol/L — ABNORMAL LOW (ref 135–145)
Total Bilirubin: 0.5 mg/dL (ref 0.3–1.2)
Total Protein: 6.5 g/dL (ref 6.5–8.1)

## 2019-05-19 NOTE — Progress Notes (Signed)
Pt in for follow up, daughter and niece in conference on ipad.  Pt reports pain in left hip and leg, family states pt is currently have PT twice a week.

## 2019-05-20 ENCOUNTER — Telehealth: Payer: Self-pay

## 2019-05-20 LAB — CEA: CEA: 6.7 ng/mL — ABNORMAL HIGH (ref 0.0–4.7)

## 2019-05-20 NOTE — Telephone Encounter (Signed)
Patient's daughter called stating that today her catherized volumes were increased and they wanted to make sure this was ok. It was explained that this is ok and we would worry if there was no urine return or very little, more than normal is good

## 2019-05-23 ENCOUNTER — Telehealth: Payer: Self-pay

## 2019-05-23 NOTE — Telephone Encounter (Signed)
Faxed new patient information to [facility])  [phone].

## 2019-05-27 ENCOUNTER — Telehealth: Payer: Self-pay

## 2019-05-27 NOTE — Telephone Encounter (Signed)
Received a message from [facility] that the patient has request for Dr Clemmie Krill as her attending provider for hospice care.

## 2019-07-13 ENCOUNTER — Other Ambulatory Visit: Payer: Self-pay | Admitting: Hematology and Oncology

## 2019-08-09 ENCOUNTER — Ambulatory Visit: Payer: Medicare HMO | Admitting: Adult Health

## 2019-08-11 DEATH — deceased

## 2019-08-18 ENCOUNTER — Inpatient Hospital Stay: Payer: Medicare HMO

## 2019-08-18 ENCOUNTER — Inpatient Hospital Stay: Payer: Medicare HMO | Admitting: Hematology and Oncology

## 2020-12-06 IMAGING — MR MR HEAD W/O CM
12 of 13 series · 44 of 48 positions shown · non-contrast
Comparison: CTA and CT Perfusion 12/27/2018.

CLINICAL DATA: [AGE] female stroke presentation with proximal
right MCA M2 occlusion treated with endovascular reperfusion.

EXAM:
MRI HEAD WITHOUT CONTRAST
TECHNIQUE: Multiplanar, multiecho pulse sequences of the brain and surrounding
structures were obtained without intravenous contrast.

[Series 5: DWI · axial · 3.0mm · 0.88mm/px · z∈[-63,+91]mm · 9 of 108 slices shown (1 of 4)]
[im 1/108]
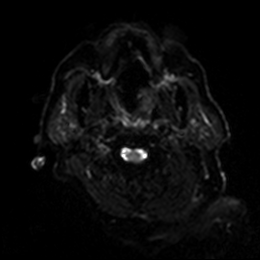
[im 14/108]
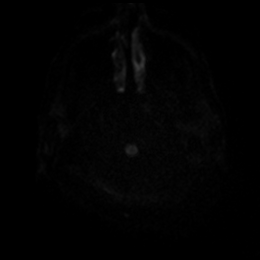
[im 27/108]
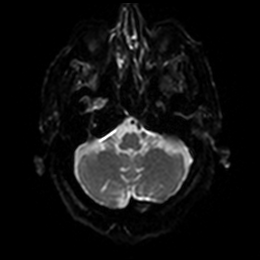
[im 41/108]
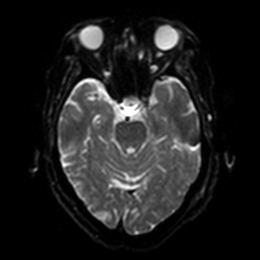
[im 54/108]
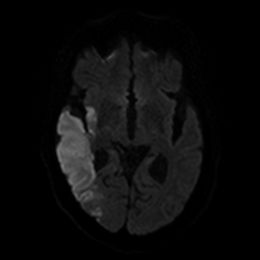
[im 67/108]
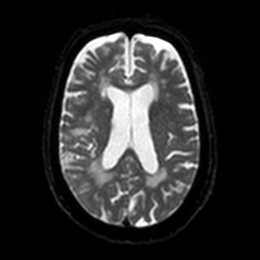
[im 81/108]
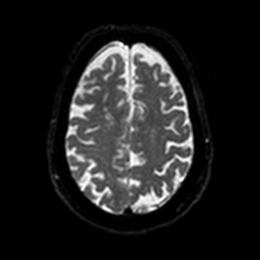
[im 94/108]
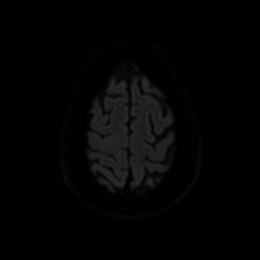
[im 108/108]
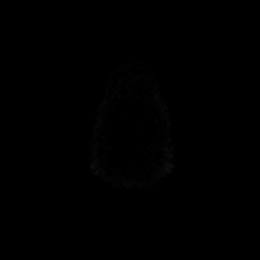

[Series 6: DWI · axial · 3.0mm · 0.88mm/px · z∈[-63,+91]mm · 4 of 54 slices shown (2 of 4)]
[im 1/54]
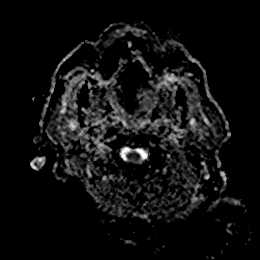
[im 18/54]
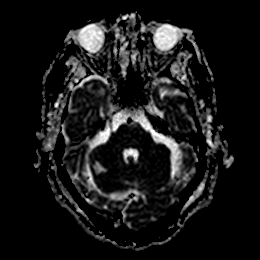
[im 36/54]
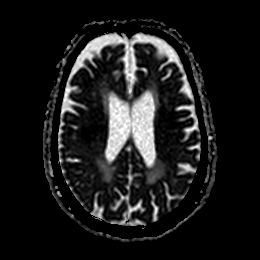
[im 54/54]
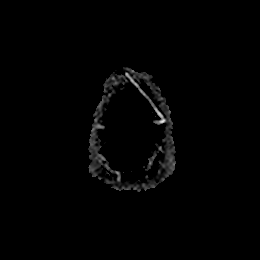

[Series 7: DWI · coronal · 4.0mm · 0.88mm/px · 5 of 76 slices shown (3 of 4)]
[im 1/76]
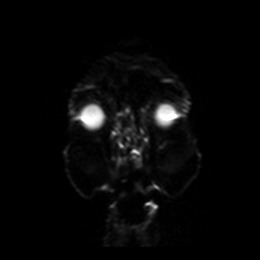
[im 19/76]
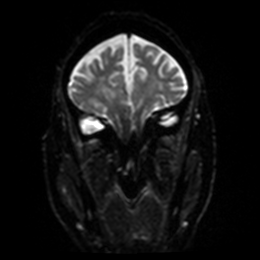
[im 38/76]
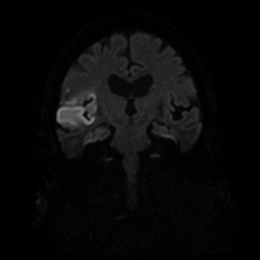
[im 57/76]
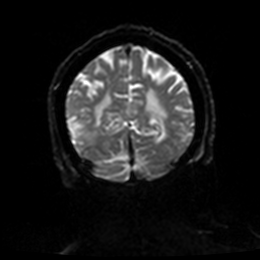
[im 76/76]
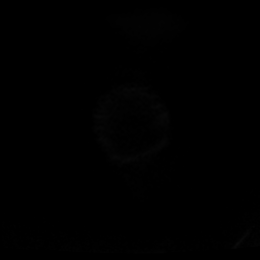

[Series 8: DWI · coronal · 4.0mm · 0.88mm/px · 3 of 38 slices shown (4 of 4)]
[im 1/38]
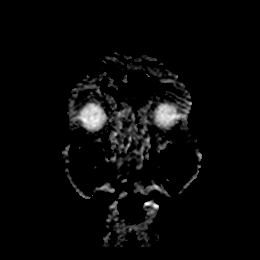
[im 19/38]
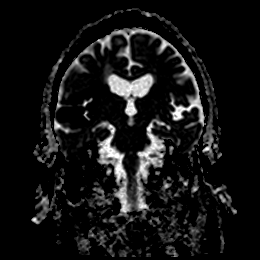
[im 38/38]
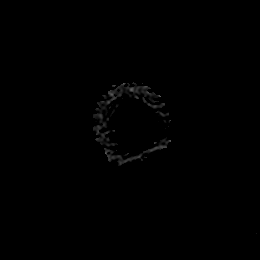

[Series 9: T1 · sagittal · 5.0mm · 0.75mm/px · 1 of 20 slices shown]
[im 1/20]
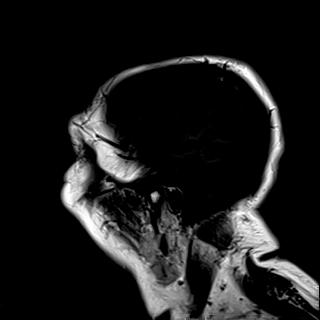

[Series 10: T2 · axial · 5.0mm · 0.72mm/px · z∈[-63,+77]mm · 2 of 25 slices shown (1 of 2)]
[im 1/25]
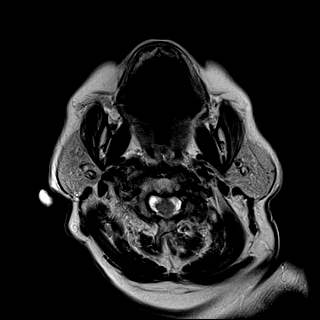
[im 25/25]
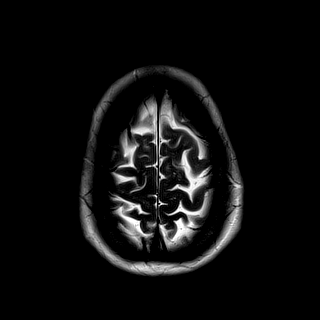

[Series 11: FLAIR · axial · 5.0mm · 0.45mm/px · z∈[-66,+73]mm · 2 of 25 slices shown]
[im 1/25]
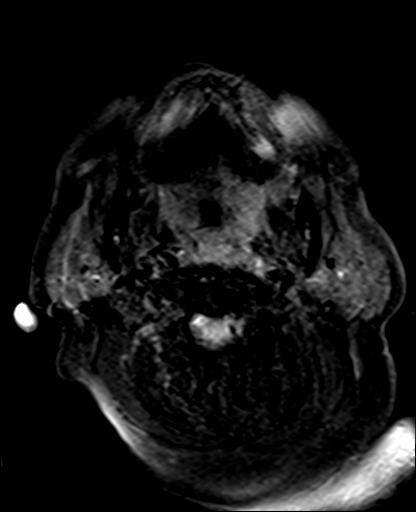
[im 25/25]
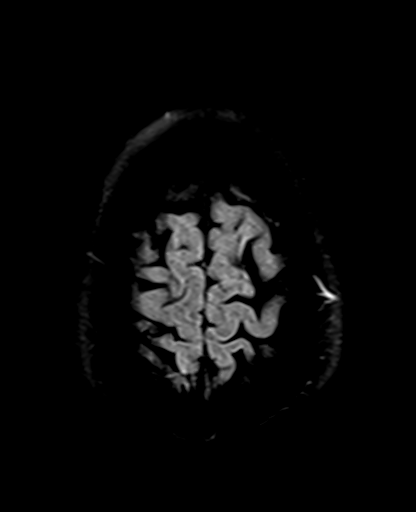

[Series 12: mag_images · axial · 3.0mm · 0.90mm/px · z∈[-70,+102]mm · 4 of 60 slices shown]
[im 1/60]
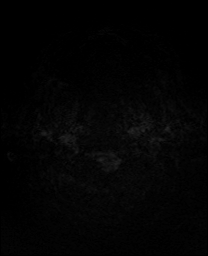
[im 20/60]
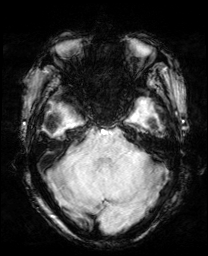
[im 40/60]
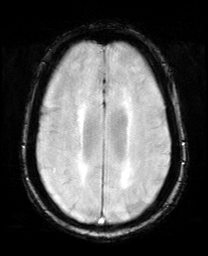
[im 60/60]
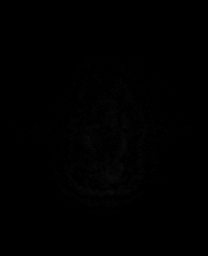

[Series 13: pha_images · axial · 3.0mm · 0.90mm/px · z∈[-67,+102]mm · 4 of 59 slices shown]
[im 1/59]
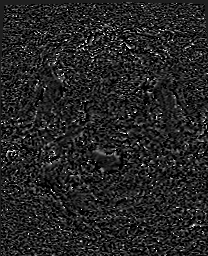
[im 20/59]
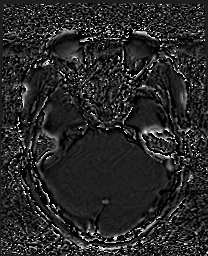
[im 39/59]
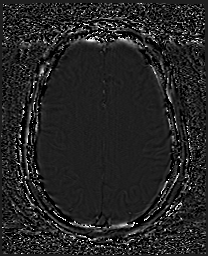
[im 59/59]
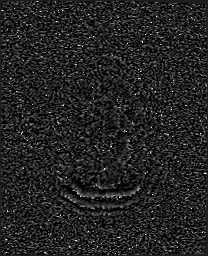

[Series 14: swi_images · axial · 3.0mm · 0.90mm/px · z∈[-70,+102]mm · 4 of 60 slices shown]
[im 1/60]
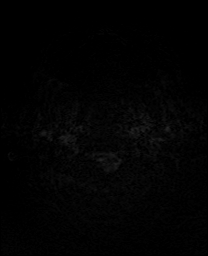
[im 20/60]
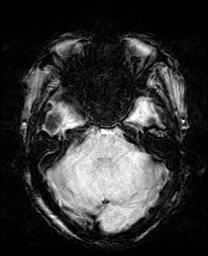
[im 40/60]
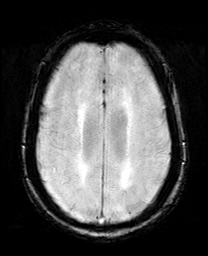
[im 60/60]
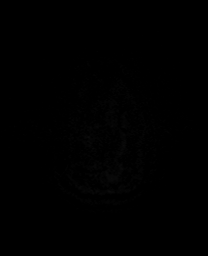

[Series 15: mip_images(sw) · axial · 24.0mm · 0.90mm/px · z∈[-60,+92]mm · 4 of 53 slices shown]
[im 1/53]
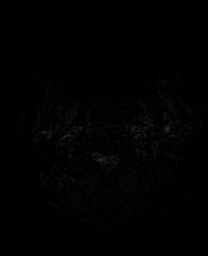
[im 18/53]
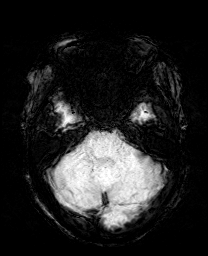
[im 35/53]
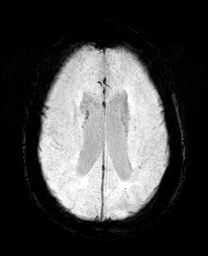
[im 53/53]
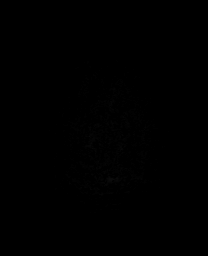

[Series 17: T2 · coronal · 5.0mm · 0.34mm/px · 2 of 29 slices shown (2 of 2)]
[im 1/29]
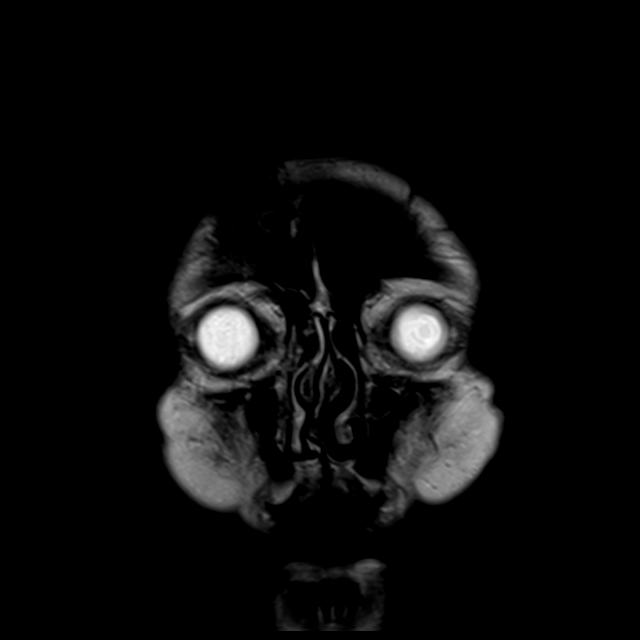
[im 29/29]
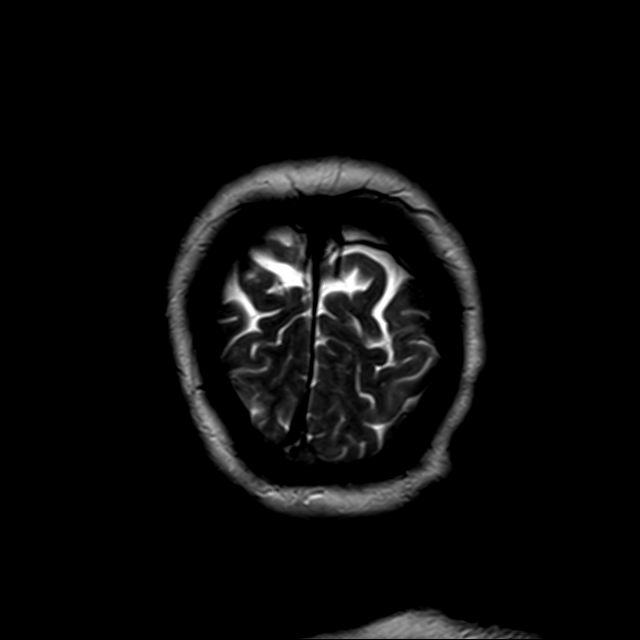

[44 of 48 positions shown; findings below may reference images not displayed]

FINDINGS: Brain: Confluent restricted diffusion from the superior right
temporal gyrus tracking posteriorly into the lateral right occipital
lobe (series 5, image 80). Confluent cytotoxic edema in that area
with mild petechial hemorrhage (series 14, image 29, Heidelberg
Classification 1A).

Additional restricted diffusion in the right insula and frontal
operculum, occasionally scattered elsewhere in the right frontal
lobe including the white matter and some of the right motor strip on
series 5, image 98.

There may also be a punctate area of cortical restricted diffusion
in the medial left parietal lobe on series 5, image 89.

The deep gray nuclei are spared. No intracranial mass effect or
midline shift at this time. No ventriculomegaly. No definite chronic
cerebral blood products. There are small chronic bilateral
cerebellar infarcts. Patchy bilateral white matter T2 and FLAIR
hyperintensity. Negative pituitary and cervicomedullary junction.

Vascular: Major intracranial vascular flow voids are preserved.

Skull and upper cervical spine: Negative for age visible cervical
spine. Visualized bone marrow signal is within normal limits.

Sinuses/Orbits: Negative, postoperative changes to the globes.

Other: Trace mastoid fluid. Small benign midline nasopharynx
retention cyst (series 10, image 5).
IMPRESSION: 1. Confluent infarct tracking from the superior right temporal gyrus
posteriorly through the lateral right occipital lobe. Additional
infarct in the right insula, scattered in the right frontal lobe.
2. Cytotoxic edema and Heidelberg Classification 1A petechial blood.
No intracranial mass effect.
3. Punctate diffusion abnormality also in the contralateral left
parietal lobe. Chronic small cerebellar infarcts.

## 2020-12-06 IMAGING — MR MR MRA HEAD W/O CM
1 series · 19 of 48 positions shown · non-contrast
Comparison: Brain MRI today. CTA head and neck and CT Perfusion
yesterday.

CLINICAL DATA: [AGE] female stroke presentation with proximal
right MCA M2 occlusion treated with endovascular reperfusion.

EXAM:
MRA HEAD WITHOUT CONTRAST
TECHNIQUE: Angiographic images of the Circle of Willis were obtained using MRA
technique without intravenous contrast.

[Series 1: 3d cow · axial · 0.5mm · 0.43mm/px · z∈[-43,+36]mm · 19 of 172 slices shown]
[im 1/172]
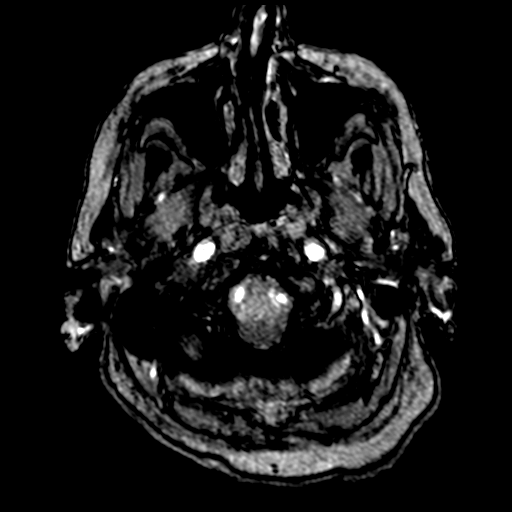
[im 4/172]
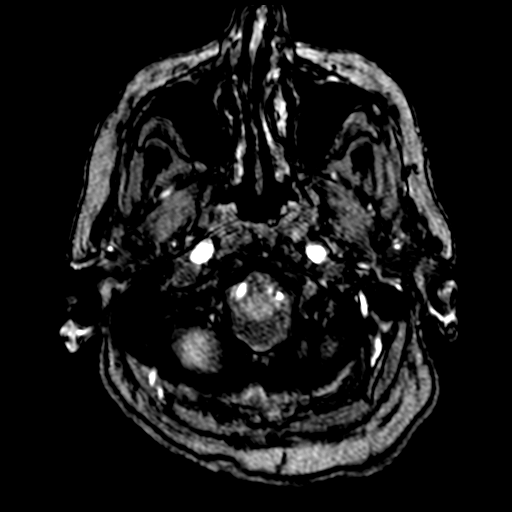
[im 8/172]
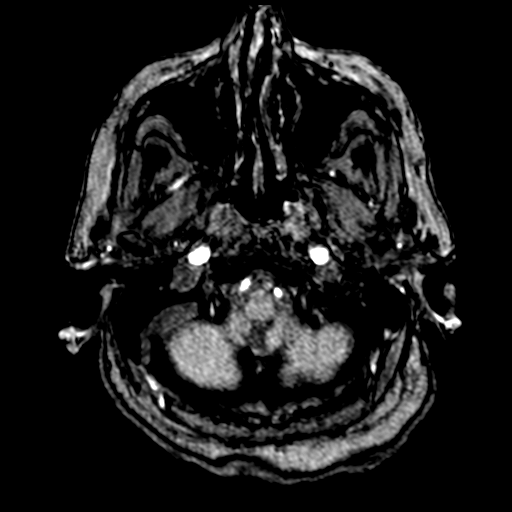
[im 11/172]
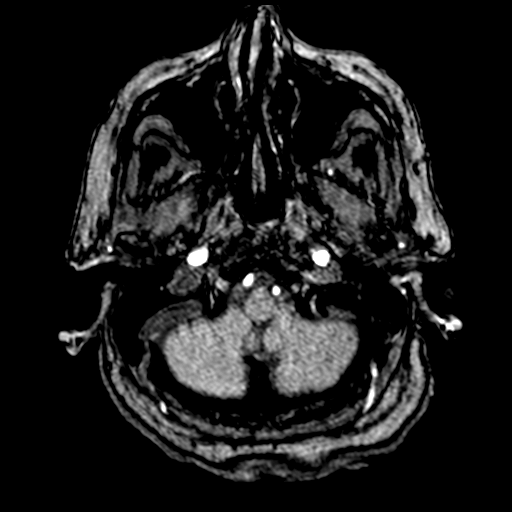
[im 15/172]
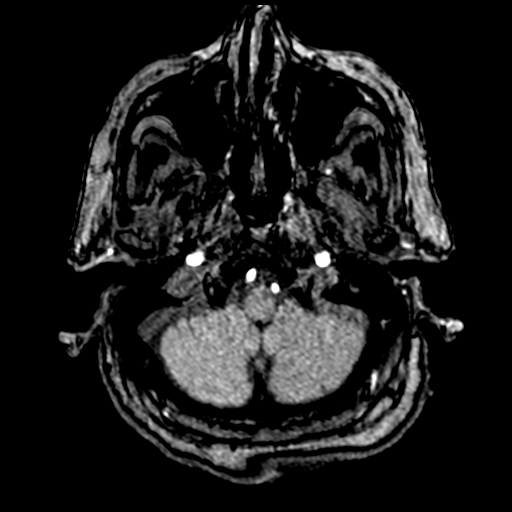
[im 19/172]
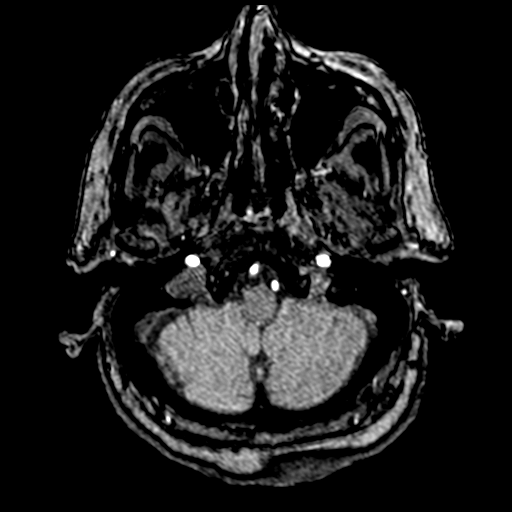
[im 22/172]
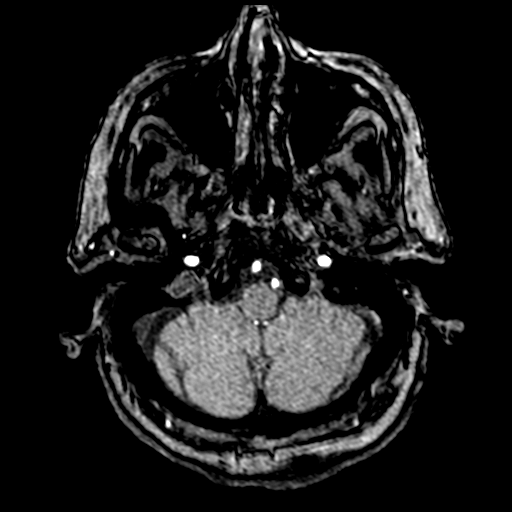
[im 26/172]
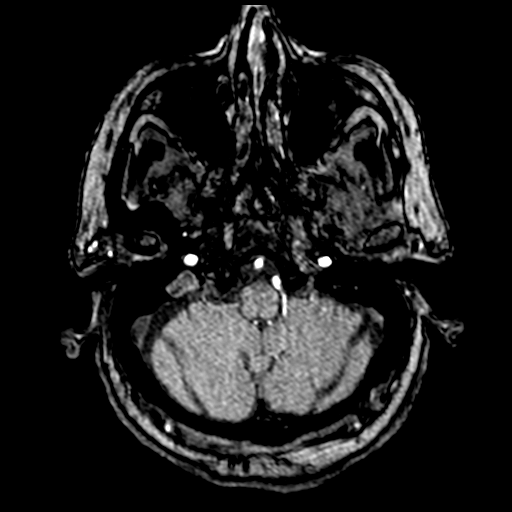
[im 30/172]
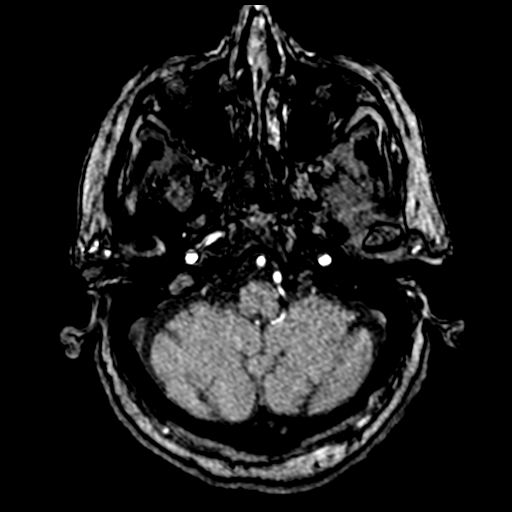
[im 33/172]
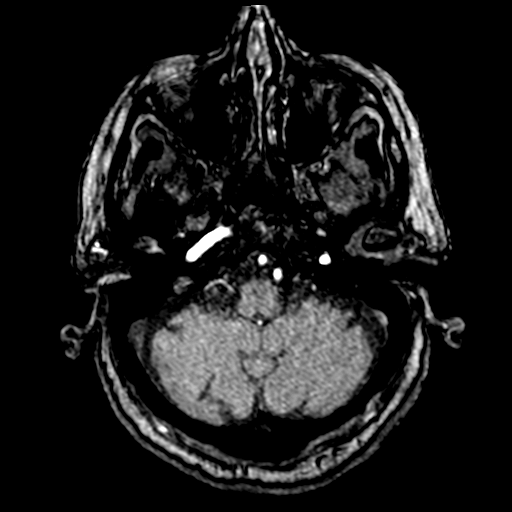
[im 37/172]
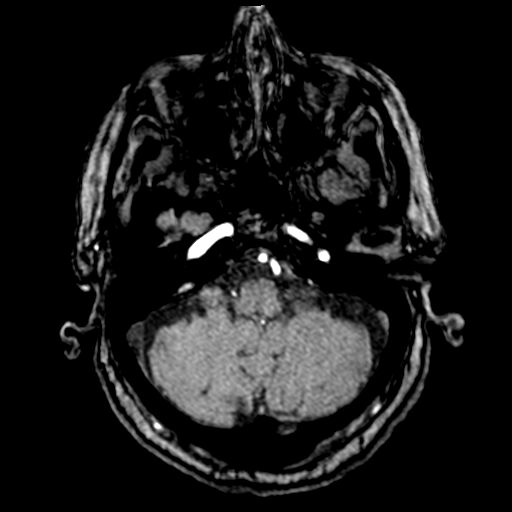
[im 55/172]
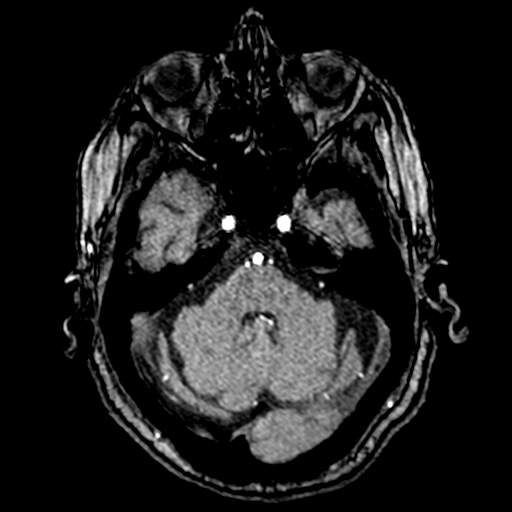
[im 77/172]
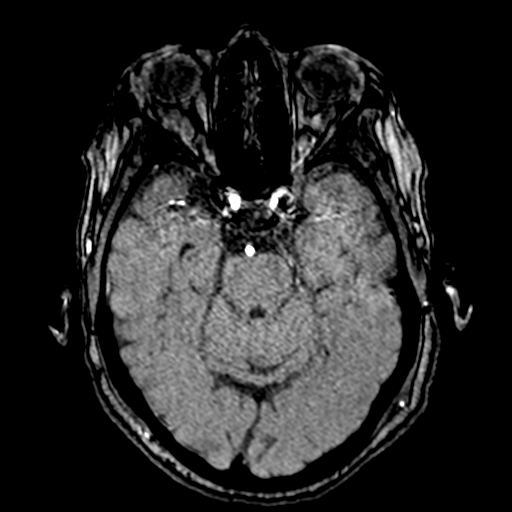
[im 88/172]
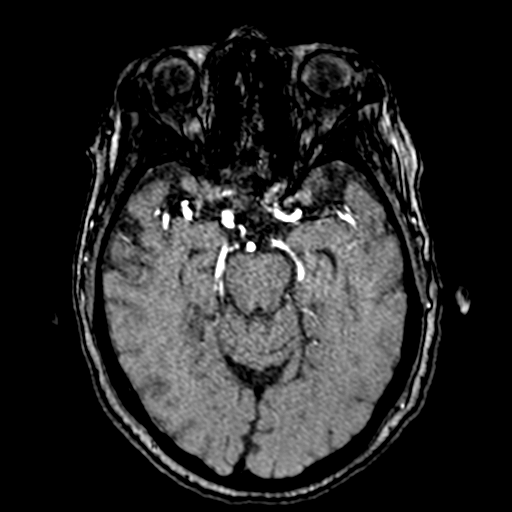
[im 99/172]
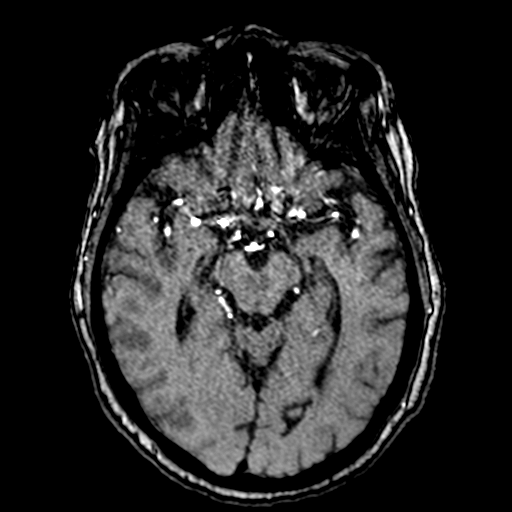
[im 121/172]
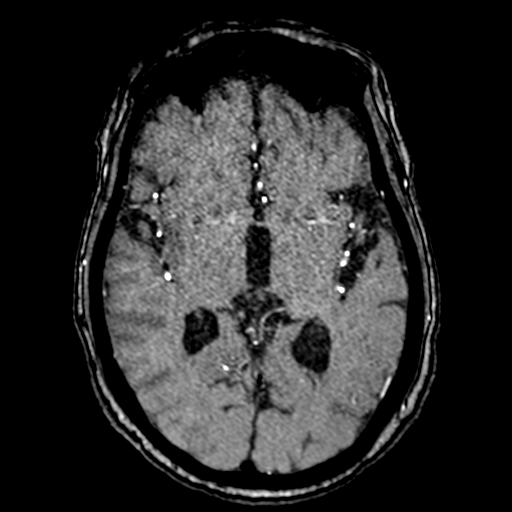
[im 142/172]
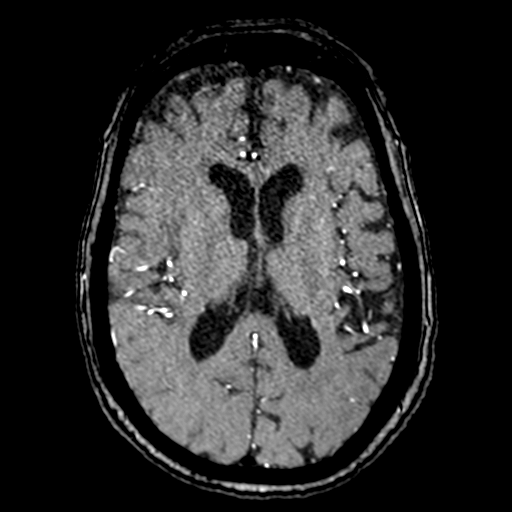
[im 146/172]
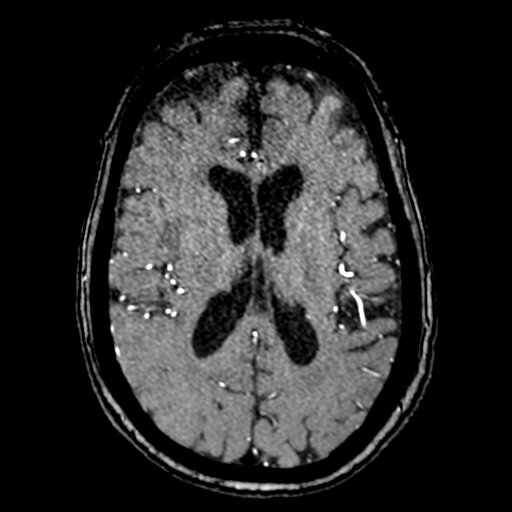
[im 164/172]
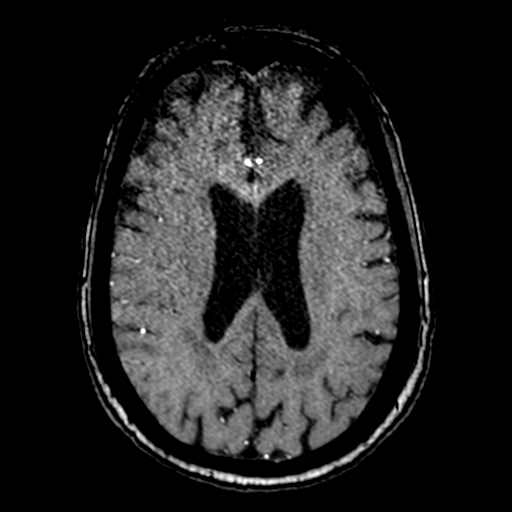

[19 of 48 positions shown; findings below may reference images not displayed]

FINDINGS: Antegrade flow in the posterior circulation with no distal vertebral
artery stenosis. Patent left PICA and bilateral AICA origins. Patent
basilar artery without stenosis. Patent SCA and PCA origins. Both
posterior communicating arteries are present. Left PCA branches are
within normal limits. Stable moderate to severe right PCA P1 and P2
stenoses with preserved distal flow.

Antegrade flow in both ICA siphons. Moderate to severe bilateral
siphon irregularity. Mild to moderate left anterior genu stenosis.
Mild right supraclinoid ICA stenosis. Ophthalmic and posterior
communicating artery origins are within normal limits. Patent
carotid termini. Dominant right ACA A1 segment. Visible ACA branches
are stable and within normal limits. Left MCA M1 segment,
bifurcation, and left MCA branches are stable without stenosis.

Right MCA M1 segment and bifurcation are patent without stenosis.
The dominant posterior right M2 branch now is patent without
stenosis. Multiple additional patent right M2 and M3 branches
compared to the CTA yesterday.
IMPRESSION: 1. Revascularization of the Right MCA branches since the CTA
yesterday. No new or recurrent occlusion identified.
2. Extensive bilateral ICA siphon atherosclerosis with stable mild
to moderate stenosis greater on the left.
3. Stable moderate to severe Right PCA stenoses.

## 2020-12-07 IMAGING — DX DG CHEST 1V PORT
1 series · 1 of 1 positions shown · non-contrast
Comparison: Chest radiograph dated 12/27/2018.

CLINICAL DATA: [AGE] female with respiratory failure.

EXAM:
PORTABLE CHEST 1 VIEW

[chest ap]
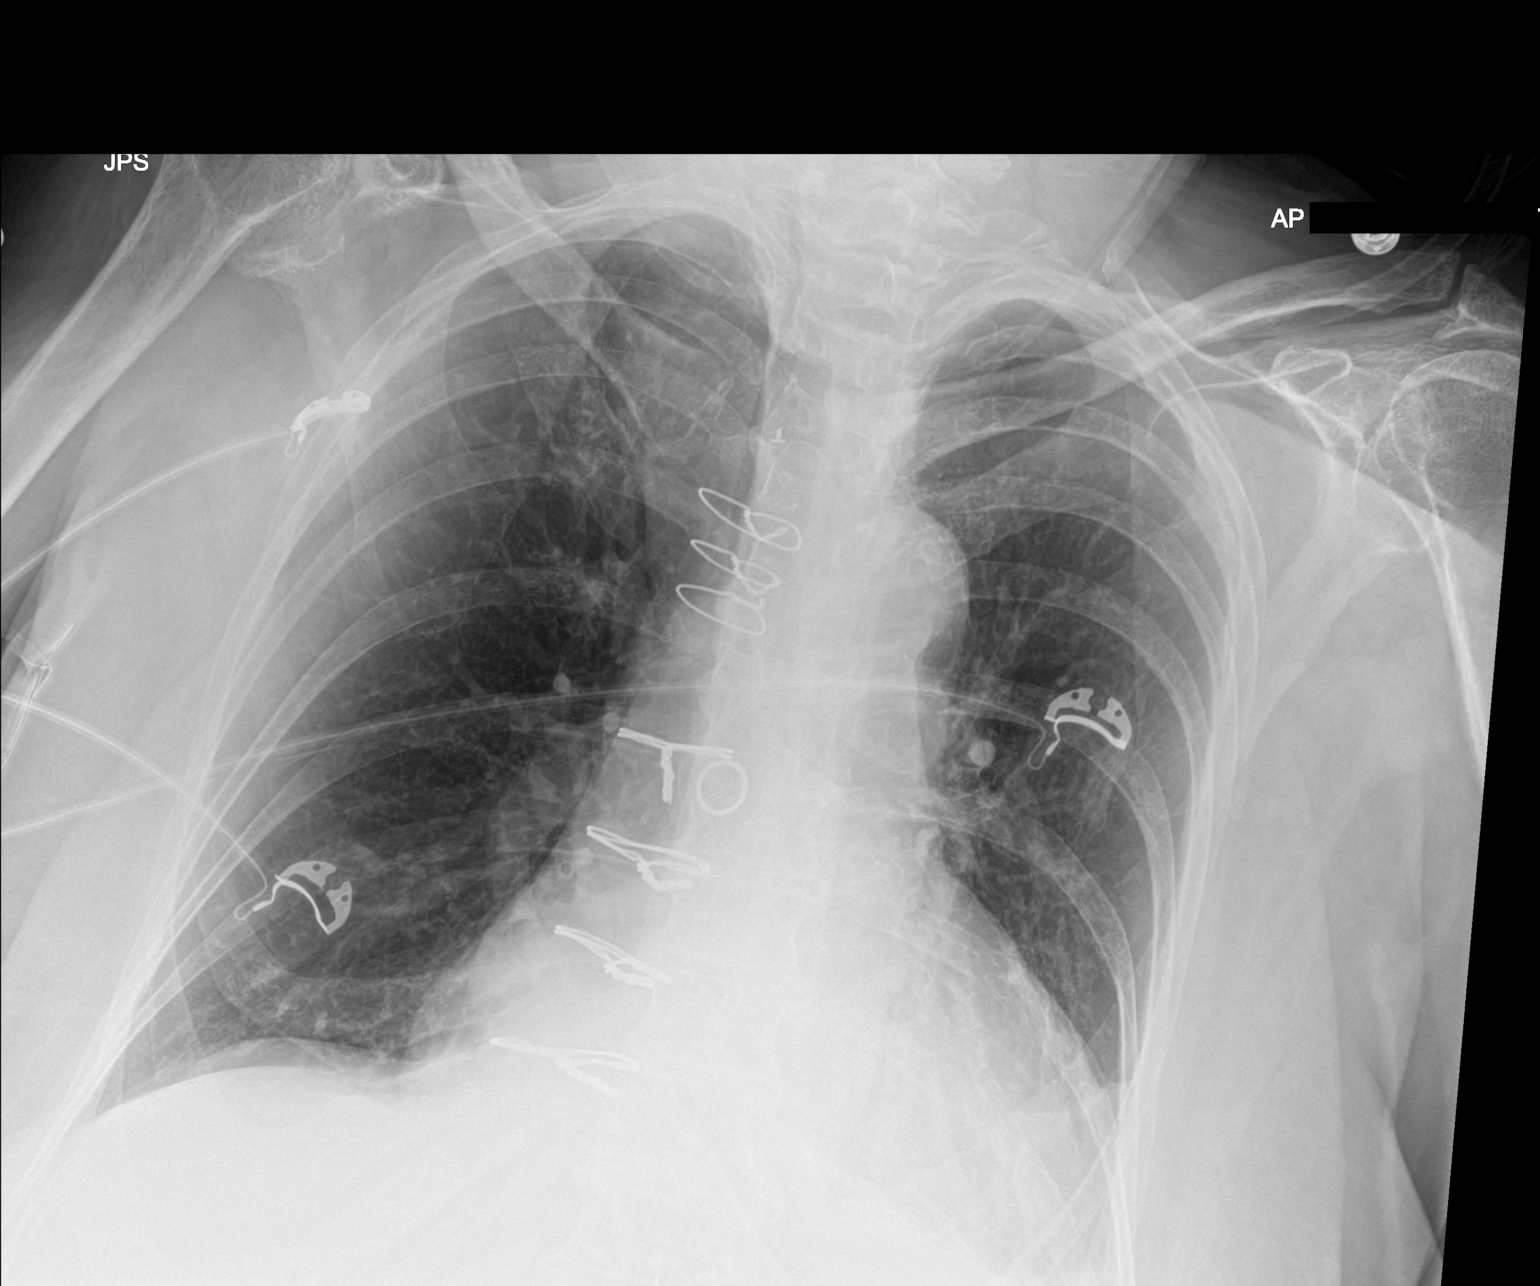

[1 of 1 positions shown; findings below may reference images not displayed]

FINDINGS: Interval removal of the endotracheal tube. Left lung base
atelectasis. Overall no interval change in the appearance of the
lungs since the prior radiograph. No pneumothorax. Stable cardiac
silhouette. Atherosclerotic calcification of the aorta. Median
sternotomy wires and CABG vascular clips. No acute osseous
pathology.
IMPRESSION: Interval extubation. No interval change in the appearance of the
lungs since the prior radiograph.
